# Patient Record
Sex: Female | Born: 1950 | Race: Black or African American | Hispanic: No | Marital: Single | Smoking: Former smoker
Health system: Southern US, Community
[De-identification: ages and names within clinical notes are randomized; demographics above are authoritative.]

## PROBLEM LIST (undated history)

## (undated) DIAGNOSIS — I10 Essential (primary) hypertension: Secondary | ICD-10-CM

## (undated) DIAGNOSIS — E669 Obesity, unspecified: Secondary | ICD-10-CM

## (undated) DIAGNOSIS — IMO0001 Reserved for inherently not codable concepts without codable children: Secondary | ICD-10-CM

## (undated) DIAGNOSIS — N63 Unspecified lump in unspecified breast: Secondary | ICD-10-CM

## (undated) DIAGNOSIS — L309 Dermatitis, unspecified: Secondary | ICD-10-CM

## (undated) DIAGNOSIS — M1990 Unspecified osteoarthritis, unspecified site: Secondary | ICD-10-CM

## (undated) DIAGNOSIS — R0602 Shortness of breath: Secondary | ICD-10-CM

## (undated) DIAGNOSIS — Z800 Family history of malignant neoplasm of digestive organs: Secondary | ICD-10-CM

## (undated) DIAGNOSIS — H269 Unspecified cataract: Secondary | ICD-10-CM

## (undated) DIAGNOSIS — D649 Anemia, unspecified: Secondary | ICD-10-CM

## (undated) DIAGNOSIS — G459 Transient cerebral ischemic attack, unspecified: Secondary | ICD-10-CM

## (undated) DIAGNOSIS — T7840XA Allergy, unspecified, initial encounter: Secondary | ICD-10-CM

## (undated) DIAGNOSIS — N281 Cyst of kidney, acquired: Secondary | ICD-10-CM

## (undated) DIAGNOSIS — K829 Disease of gallbladder, unspecified: Secondary | ICD-10-CM

## (undated) DIAGNOSIS — G4730 Sleep apnea, unspecified: Secondary | ICD-10-CM

## (undated) HISTORY — DX: Transient cerebral ischemic attack, unspecified: G45.9

## (undated) HISTORY — DX: Sleep apnea, unspecified: G47.30

## (undated) HISTORY — DX: Reserved for inherently not codable concepts without codable children: IMO0001

## (undated) HISTORY — DX: Shortness of breath: R06.02

## (undated) HISTORY — DX: Cyst of kidney, acquired: N28.1

## (undated) HISTORY — PX: ABDOMINAL HYSTERECTOMY: SHX81

## (undated) HISTORY — DX: Anemia, unspecified: D64.9

## (undated) HISTORY — DX: Dermatitis, unspecified: L30.9

## (undated) HISTORY — DX: Unspecified cataract: H26.9

## (undated) HISTORY — DX: Obesity, unspecified: E66.9

## (undated) HISTORY — DX: Unspecified osteoarthritis, unspecified site: M19.90

## (undated) HISTORY — DX: Disease of gallbladder, unspecified: K82.9

## (undated) HISTORY — DX: Unspecified lump in unspecified breast: N63.0

## (undated) HISTORY — DX: Allergy, unspecified, initial encounter: T78.40XA

## (undated) HISTORY — PX: CHOLECYSTECTOMY: SHX55

## (undated) HISTORY — DX: Family history of malignant neoplasm of digestive organs: Z80.0

---

## 1996-03-20 HISTORY — PX: PARTIAL HYSTERECTOMY: SHX80

## 1998-11-19 HISTORY — PX: BUNIONECTOMY: SHX129

## 1998-11-19 HISTORY — PX: FOOT SURGERY: SHX648

## 2000-11-12 ENCOUNTER — Other Ambulatory Visit: Admission: RE | Admit: 2000-11-12 | Discharge: 2000-11-12 | Payer: Self-pay | Admitting: Family Medicine

## 2002-02-24 ENCOUNTER — Other Ambulatory Visit: Admission: RE | Admit: 2002-02-24 | Discharge: 2002-02-24 | Payer: Self-pay | Admitting: Family Medicine

## 2002-02-24 ENCOUNTER — Encounter: Payer: Self-pay | Admitting: Family Medicine

## 2002-05-19 ENCOUNTER — Encounter: Admission: RE | Admit: 2002-05-19 | Discharge: 2002-05-19 | Payer: Self-pay | Admitting: Family Medicine

## 2002-05-19 ENCOUNTER — Encounter: Payer: Self-pay | Admitting: Family Medicine

## 2002-05-19 HISTORY — PX: CHOLECYSTECTOMY: SHX55

## 2002-05-25 ENCOUNTER — Emergency Department (HOSPITAL_COMMUNITY): Admission: EM | Admit: 2002-05-25 | Discharge: 2002-05-25 | Payer: Self-pay | Admitting: Emergency Medicine

## 2002-06-17 ENCOUNTER — Encounter: Payer: Self-pay | Admitting: General Surgery

## 2002-06-17 ENCOUNTER — Encounter (INDEPENDENT_AMBULATORY_CARE_PROVIDER_SITE_OTHER): Payer: Self-pay

## 2002-06-17 ENCOUNTER — Observation Stay (HOSPITAL_COMMUNITY): Admission: RE | Admit: 2002-06-17 | Discharge: 2002-06-18 | Payer: Self-pay | Admitting: General Surgery

## 2002-10-22 ENCOUNTER — Encounter: Admission: RE | Admit: 2002-10-22 | Discharge: 2002-10-22 | Payer: Self-pay | Admitting: Family Medicine

## 2002-10-22 ENCOUNTER — Encounter: Payer: Self-pay | Admitting: Family Medicine

## 2004-02-24 ENCOUNTER — Ambulatory Visit: Payer: Self-pay | Admitting: Family Medicine

## 2004-09-21 ENCOUNTER — Ambulatory Visit: Payer: Self-pay | Admitting: Family Medicine

## 2004-12-19 ENCOUNTER — Ambulatory Visit: Payer: Self-pay | Admitting: Family Medicine

## 2005-10-18 ENCOUNTER — Ambulatory Visit: Payer: Self-pay | Admitting: Family Medicine

## 2005-12-20 ENCOUNTER — Ambulatory Visit: Payer: Self-pay | Admitting: Internal Medicine

## 2006-06-25 ENCOUNTER — Ambulatory Visit: Payer: Self-pay | Admitting: Family Medicine

## 2006-08-02 ENCOUNTER — Encounter: Payer: Self-pay | Admitting: Family Medicine

## 2006-08-02 DIAGNOSIS — L309 Dermatitis, unspecified: Secondary | ICD-10-CM | POA: Insufficient documentation

## 2006-08-15 ENCOUNTER — Other Ambulatory Visit: Admission: RE | Admit: 2006-08-15 | Discharge: 2006-08-15 | Payer: Self-pay | Admitting: Family Medicine

## 2006-08-15 ENCOUNTER — Ambulatory Visit: Payer: Self-pay | Admitting: Family Medicine

## 2006-08-15 ENCOUNTER — Encounter: Payer: Self-pay | Admitting: Family Medicine

## 2006-08-16 LAB — CONVERTED CEMR LAB
ALT: 15 units/L (ref 0–35)
AST: 18 units/L (ref 0–37)
Basophils Absolute: 0 10*3/uL (ref 0.0–0.1)
Basophils Relative: 0 % (ref 0–1)
Bilirubin, Direct: 0.1 mg/dL (ref 0.0–0.3)
Calcium: 9.7 mg/dL (ref 8.4–10.5)
Cholesterol: 152 mg/dL (ref 0–200)
Eosinophils Absolute: 0.2 10*3/uL (ref 0.0–0.7)
Eosinophils Relative: 2 % (ref 0–5)
HCT: 39.5 % (ref 36.0–46.0)
HDL: 46 mg/dL (ref >39)
Lymphs Abs: 2.7 10*3/uL (ref 0.7–3.3)
MCV: 85.3 fL (ref 78.0–100.0)
Neutrophils Relative %: 56 % (ref 43–77)
Platelets: 273 10*3/uL (ref 150–400)
Potassium: 4.1 meq/L (ref 3.5–5.3)
RDW: 14.4 % — ABNORMAL HIGH (ref 11.5–14.0)
Sodium: 139 meq/L (ref 135–145)
TSH: 1.509 microintl units/mL (ref 0.350–5.50)
Total CHOL/HDL Ratio: 3.3
Total Protein: 7.1 g/dL (ref 6.0–8.3)
Triglycerides: 73 mg/dL (ref <150)
VLDL: 15 mg/dL (ref 0–40)
WBC: 7.8 10*3/uL (ref 4.0–10.5)

## 2006-08-22 ENCOUNTER — Encounter (INDEPENDENT_AMBULATORY_CARE_PROVIDER_SITE_OTHER): Payer: Self-pay | Admitting: *Deleted

## 2006-08-22 LAB — CONVERTED CEMR LAB: Pap Smear: NORMAL

## 2007-01-16 ENCOUNTER — Ambulatory Visit: Payer: Self-pay | Admitting: Internal Medicine

## 2007-01-26 LAB — HM COLONOSCOPY: HM Colonoscopy: NORMAL

## 2007-01-30 ENCOUNTER — Ambulatory Visit: Payer: Self-pay | Admitting: Internal Medicine

## 2007-01-30 ENCOUNTER — Encounter: Payer: Self-pay | Admitting: Family Medicine

## 2007-02-23 ENCOUNTER — Ambulatory Visit: Payer: Self-pay | Admitting: Family Medicine

## 2007-06-20 ENCOUNTER — Ambulatory Visit: Payer: Self-pay | Admitting: Family Medicine

## 2007-08-16 ENCOUNTER — Ambulatory Visit: Payer: Self-pay | Admitting: Family Medicine

## 2008-01-27 ENCOUNTER — Ambulatory Visit: Payer: Self-pay | Admitting: Family Medicine

## 2008-01-27 DIAGNOSIS — L83 Acanthosis nigricans: Secondary | ICD-10-CM | POA: Insufficient documentation

## 2008-01-27 LAB — CONVERTED CEMR LAB
BUN: 16 mg/dL (ref 6–23)
Calcium: 9.4 mg/dL (ref 8.4–10.5)
Chloride: 106 meq/L (ref 96–112)
Creatinine, Ser: 0.9 mg/dL (ref 0.4–1.2)
GFR calc non Af Amer: 69 mL/min

## 2008-01-30 ENCOUNTER — Ambulatory Visit: Payer: Self-pay | Admitting: Family Medicine

## 2008-08-10 ENCOUNTER — Encounter: Admission: RE | Admit: 2008-08-10 | Discharge: 2008-08-10 | Payer: Self-pay | Admitting: Family Medicine

## 2008-08-10 ENCOUNTER — Ambulatory Visit: Payer: Self-pay | Admitting: Family Medicine

## 2008-08-26 ENCOUNTER — Telehealth: Payer: Self-pay | Admitting: Family Medicine

## 2008-11-18 ENCOUNTER — Other Ambulatory Visit: Admission: RE | Admit: 2008-11-18 | Discharge: 2008-11-18 | Payer: Self-pay | Admitting: Family Medicine

## 2008-11-18 ENCOUNTER — Ambulatory Visit: Payer: Self-pay | Admitting: Family Medicine

## 2008-11-18 ENCOUNTER — Encounter: Payer: Self-pay | Admitting: Family Medicine

## 2008-11-18 DIAGNOSIS — IMO0001 Reserved for inherently not codable concepts without codable children: Secondary | ICD-10-CM

## 2008-11-18 DIAGNOSIS — R109 Unspecified abdominal pain: Secondary | ICD-10-CM | POA: Insufficient documentation

## 2008-11-18 HISTORY — DX: Reserved for inherently not codable concepts without codable children: IMO0001

## 2008-11-18 LAB — HM PAP SMEAR

## 2008-11-24 ENCOUNTER — Encounter (INDEPENDENT_AMBULATORY_CARE_PROVIDER_SITE_OTHER): Payer: Self-pay | Admitting: *Deleted

## 2008-12-09 ENCOUNTER — Ambulatory Visit: Payer: Self-pay | Admitting: Internal Medicine

## 2008-12-09 ENCOUNTER — Encounter: Payer: Self-pay | Admitting: Family Medicine

## 2008-12-09 ENCOUNTER — Ambulatory Visit: Payer: Self-pay

## 2008-12-14 ENCOUNTER — Ambulatory Visit: Payer: Self-pay | Admitting: Family Medicine

## 2008-12-14 DIAGNOSIS — N259 Disorder resulting from impaired renal tubular function, unspecified: Secondary | ICD-10-CM | POA: Insufficient documentation

## 2008-12-14 LAB — CONVERTED CEMR LAB
Bacteria, UA: 0
Blood in Urine, dipstick: NEGATIVE
Glucose, Urine, Semiquant: NEGATIVE
Protein, U semiquant: NEGATIVE
Specific Gravity, Urine: 1.015
Urobilinogen, UA: 0.2
WBC Urine, dipstick: NEGATIVE
WBC, UA: 0 cells/hpf
Yeast, UA: 0

## 2008-12-17 ENCOUNTER — Encounter (INDEPENDENT_AMBULATORY_CARE_PROVIDER_SITE_OTHER): Payer: Self-pay | Admitting: *Deleted

## 2008-12-21 ENCOUNTER — Encounter (INDEPENDENT_AMBULATORY_CARE_PROVIDER_SITE_OTHER): Payer: Self-pay | Admitting: *Deleted

## 2008-12-28 ENCOUNTER — Ambulatory Visit: Payer: Self-pay | Admitting: Family Medicine

## 2008-12-30 LAB — CONVERTED CEMR LAB
BUN: 11 mg/dL (ref 6–23)
CO2: 29 meq/L (ref 19–32)
Chloride: 104 meq/L (ref 96–112)
GFR calc non Af Amer: 73.76 mL/min (ref >60)
Phosphorus: 4 mg/dL (ref 2.3–4.6)
Potassium: 4.2 meq/L (ref 3.5–5.1)

## 2009-08-10 ENCOUNTER — Encounter: Admission: RE | Admit: 2009-08-10 | Discharge: 2009-08-10 | Payer: Self-pay | Admitting: Family Medicine

## 2009-08-10 ENCOUNTER — Ambulatory Visit: Payer: Self-pay | Admitting: Family Medicine

## 2009-08-10 DIAGNOSIS — R51 Headache: Secondary | ICD-10-CM | POA: Insufficient documentation

## 2009-08-10 DIAGNOSIS — R519 Headache, unspecified: Secondary | ICD-10-CM | POA: Insufficient documentation

## 2009-08-10 DIAGNOSIS — M25559 Pain in unspecified hip: Secondary | ICD-10-CM | POA: Insufficient documentation

## 2009-08-24 ENCOUNTER — Encounter: Payer: Self-pay | Admitting: Family Medicine

## 2009-08-25 LAB — FECAL OCCULT BLOOD, GUAIAC: Fecal Occult Blood: NEGATIVE

## 2009-10-04 ENCOUNTER — Telehealth: Payer: Self-pay | Admitting: Family Medicine

## 2010-04-17 LAB — CONVERTED CEMR LAB
Basophils Absolute: 0 10*3/uL (ref 0.0–0.1)
Basophils Relative: 0 % (ref 0.0–3.0)
Bilirubin, Direct: 0 mg/dL (ref 0.0–0.3)
Blood in Urine, dipstick: NEGATIVE
Calcium: 9.4 mg/dL (ref 8.4–10.5)
Eosinophils Absolute: 0.2 10*3/uL (ref 0.0–0.7)
Eosinophils Relative: 2.5 % (ref 0.0–5.0)
GFR calc non Af Amer: 54.51 mL/min (ref >60)
Glucose, Urine, Semiquant: NEGATIVE
HCT: 39 % (ref 36.0–46.0)
HDL: 42.8 mg/dL (ref >39.00)
Hemoglobin: 13.1 g/dL (ref 12.0–15.0)
Lymphocytes Relative: 30.9 % (ref 12.0–46.0)
Monocytes Relative: 3.4 % (ref 3.0–12.0)
Neutro Abs: 5.9 10*3/uL (ref 1.4–7.7)
Nitrite: NEGATIVE
Potassium: 4.4 meq/L (ref 3.5–5.1)
Protein, U semiquant: NEGATIVE
RBC: 4.48 M/uL (ref 3.87–5.11)
Sodium: 142 meq/L (ref 135–145)
TSH: 1.29 microintl units/mL (ref 0.35–5.50)
Total Bilirubin: 0.6 mg/dL (ref 0.3–1.2)
Total CHOL/HDL Ratio: 3
VLDL: 17.8 mg/dL (ref 0.0–40.0)
WBC Urine, dipstick: NEGATIVE
pH: 6

## 2010-04-21 NOTE — Progress Notes (Signed)
Summary: Triage Call...  Phone Note Call from Patient   Caller: Patient Call For: Judith Part MD Summary of Call: Triage Call... Pt request a handicap sticker, pts call back  # [phone].Marland KitchenDaine Gip  October 04, 2009 3:44 PM  Initial call taken by: Daine Gip,  October 04, 2009 3:44 PM  Follow-up for Phone Call        ask her what dx we are using for the inability to walk (ie: why she needs it ) and I will get it done for her  Follow-up by: Judith Part MD,  October 04, 2009 5:01 PM  Additional Follow-up for Phone Call Additional follow up Details #1::        Patient notified as instructed by telephone. Pt said needs handicap sticker due to arthritis in back. She can only walk short distances without stopping to rest.[facility] LPN  October 04, 2009 5:20 PM   form done and in nurse in box -- [place] Additional Follow-up by: Judith Part MD,  October 04, 2009 5:23 PM    Additional Follow-up for Phone Call Additional follow up Details #2::    Left message for patient to call back. Handicap placard form left at front desk. Lewanda Rife LPN  October 05, 2009 11:27 AM   Patient notified as instructed by telephone. Lewanda Rife LPN  October 05, 2009 12:46 PM

## 2010-04-21 NOTE — Assessment & Plan Note (Signed)
Summary: abdominalpain/  head mri??dlo   Vital Signs:  Patient profile:   60 year old female Height:      63 inches Weight:      246.25 pounds BMI:     43.78 Temp:     98 degrees F oral Pulse rate:   72 / minute Pulse rhythm:   regular BP sitting:   110 / 68  (left arm) Cuff size:   large  Vitals Entered By: Lewanda Rife LPN (Aug 10, 2009 8:08 AM) CC: Lower abdominal pain is sharp on and off. Pt ? muscular. ) pain now. Also sometimes has strange feeling lt side of head and h/a on and off   History of Present Illness: sometimes she has a pain in L groin  is worse when she walks -- tries to lean to the other side  is on and off- not bothering her today  hyst in past - was partial for fibroids   no female cancer in family mother had colon cancer her colonosc 08 -- nothing at all no stool changes or blood in her stool  last pap nl 9/10    sometimes funny feeling on L side of her head  pressure feeling - was brief and fleeting  did not hurt  few mild headaches occas - not often  could be stress rel-- a lot of grants and deadlines and needs a vacation  no speech problems or facial droop or numbness or weakness   will get a break next month from work and stress- has been difficult lately     Allergies: 1)  ! * Tums 2)  Penicillin  Past History:  Past Medical History: Last updated: 01/30/2008 eczema  obesity fam hx of colon ca   Past Surgical History: Last updated: 12/10/2008 Cholecystectomy (05/2002) Hysterectomy- fibroids (1998)- ovaries remain  Foot surgery- bunion (11/1998) Breast lump- left Dexa- normal (03/2002),  normal (05/2006) [place]- gallstones, small renal cyst (05/2002) Small cysts right kidney (10/2002) colonosc neg 11/08- next due in 5 years 9/10 exercise stress test normal but limited by exercise intolerance  Family History: Last updated: 08/15/2006 Father: Lung cancer- smoker Mother: Colon and stomach cancer Siblings: 2 sisters with HTN 2 GM  with CVAs  Social History: Last updated: 08/02/2006 Marital Status:  Children: 2 Occupation: Counsellor Former Smoker  Risk Factors: Smoking Status: quit (08/02/2006)  Review of Systems General:  Denies fatigue, fever, loss of appetite, malaise, and weakness. Eyes:  Denies blurring and eye pain. CV:  Denies chest pain or discomfort, palpitations, and shortness of breath with exertion. Resp:  Denies cough, shortness of breath, sputum productive, and wheezing. GI:  Denies abdominal pain, bloody stools, change in bowel habits, indigestion, and nausea. GU:  Denies discharge, dysuria, hematuria, and urinary frequency. MS:  Complains of joint pain; denies joint redness, joint swelling, muscle aches, and cramps. Derm:  Denies itching, lesion(s), poor wound healing, and rash. Neuro:  Complains of headaches; denies difficulty with concentration, disturbances in coordination, falling down, memory loss, numbness, tingling, tremors, visual disturbances, and weakness. Psych:  Denies anxiety and depression. Endo:  Denies cold intolerance, excessive thirst, excessive urination, and heat intolerance. Heme:  Denies abnormal bruising and bleeding.  Physical Exam  General:  obese and generally well appearing  Head:  normocephalic, atraumatic, and no abnormalities observed.  no sinus tenderness no temporal tenderness  Eyes:  vision grossly intact, pupils equal, pupils round, and pupils reactive to light.  fundi grossly nl bilat no conjunctival pallor, injection or  icterus  no nystagmus  Ears:  some cerumen and dry skin in ears today Nose:  no nasal discharge.   Mouth:  pharynx pink and moist.   Neck:  supple with full rom and no masses or thyromegally, no JVD or carotid bruit  Chest Wall:  No deformities, masses, or tenderness noted. Lungs:  Normal respiratory effort, chest expands symmetrically. Lungs are clear to auscultation, no crackles or wheezes. Heart:  Normal rate and regular rhythm. S1  and S2 normal without gallop, murmur, click, rub or other extra sounds. Abdomen:  Bowel sounds positive,abdomen soft and non-tender without masses, organomegaly or hernias noted. Msk:  some reprod of hip/ groin pain with SLR on L and also with ext rot of hip mild LS tenderness no trochanteric tenderness   Pulses:  R and L carotid,radial,femoral,dorsalis pedis and posterior tibial pulses are full and equal bilaterally Extremities:  No clubbing, cyanosis, edema, or deformity noted with normal full range of motion of all joints.   Neurologic:  alert & oriented X3, cranial nerves II-XII intact, strength normal in all extremities, sensation intact to pinprick, gait normal, DTRs symmetrical and normal, toes down bilaterally on Babinski, and Romberg negative.   Skin:  Intact without suspicious lesions or rashes Cervical Nodes:  No lymphadenopathy noted Inguinal Nodes:  No significant adenopathy Psych:  normal affect, talkative and pleasant    Impression & Recommendations:  Problem # 1:  INGUINAL PAIN, LEFT (ICD-789.09) Assessment New with reprod of pain on SLR and also ext rot of hip / no tenderness or hernia noted  suspect MS etiol like OA of hip or radiculopathy -- esp in light of morbid obesity  will send for x ray LS and hip and update no meds at this time disc stretching if all nl -- pt is interested in [place] of ovaries  Orders: Radiology Referral (Radiology)  Problem # 2:  HEADACHE (ICD-784.0) Assessment: New intermittent and very mild with nl neuro exam and no red flags  suspect is stress related and also disc poss of migraine like syndrome  will try limiting caff/ inc water and more sleep if not imp- f/u or update   Patient Instructions: 1)  we will send you for some xrays of your hip and back at check out  2)  if pain worsens let me know  3)  if headache becomes more persistant or any other changes like numbness or speech problems -- alert me or seek care  4)  try to avoid  caffine and drink lots of water   Prior Medications (reviewed today): None Current Allergies (reviewed today): ! * TUMS PENICILLIN

## 2010-04-21 NOTE — Consult Note (Signed)
Summary: [facility]  [facility]   Imported By: Lanelle Bal 09/10/2009 10:14:32  _____________________________________________________________________  External Attachment:    Type:   Image     Comment:   External Document

## 2010-04-30 ENCOUNTER — Encounter: Payer: Self-pay | Admitting: Family Medicine

## 2010-04-30 LAB — HM MAMMOGRAPHY: HM Mammogram: NORMAL

## 2010-05-04 ENCOUNTER — Encounter (INDEPENDENT_AMBULATORY_CARE_PROVIDER_SITE_OTHER): Payer: Self-pay | Admitting: *Deleted

## 2010-05-11 NOTE — Miscellaneous (Signed)
Summary: Mammogram to flowsheet   Clinical Lists Changes  Observations: Added new observation of MAMMO DUE: 04/2011 (05/04/2010 12:19) Added new observation of MAMMOGRAM: normal (04/30/2010 12:19)      Preventive Care Screening  Mammogram:    Date:  04/30/2010    Next Due:  04/2011    Results:  normal

## 2010-05-11 NOTE — Letter (Signed)
Summary: Results Follow up Letter  [place] [facility]  [address] [place], [place] [address]   Phone: [phone]  Fax: [phone]    05/04/2010 MRN: [id number]  [address] [place], [place]  [address]  Dear Barbara Kramer,  The following are the results of your recent test(s):  Test         Result    Pap Smear:        Normal _____  Not Normal _____ Comments: ______________________________________________________ Cholesterol: LDL(Bad cholesterol):         Your goal is less than:         HDL (Good cholesterol):       Your goal is more than: Comments:  ______________________________________________________ Mammogram:        Normal __X___  Not Normal _____ Comments: Repeat in 1 year  ___________________________________________________________________ Hemoccult:        Normal _____  Not normal _______ Comments:    _____________________________________________________________________ Other Tests:    We routinely do not discuss normal results over the telephone.  If you desire a copy of the results, or you have any questions about this information we can discuss them at your next office visit.   Sincerely,      Sharilyn Sites for Dr. Roxy Manns

## 2010-08-05 NOTE — Op Note (Signed)
NAME:  Barbara Kramer, Barbara Kramer                        ACCOUNT NO.:  [id number]   MEDICAL RECORD NO.:  [id number]                   PATIENT TYPE:  AMB   LOCATION:  DAY                                  FACILITY:  [facility]   PHYSICIAN:  Adolph Pollack, M.D.            DATE OF BIRTH:  [birth year]   DATE OF PROCEDURE:  06/17/2002  DATE OF DISCHARGE:                                 OPERATIVE REPORT   PREOPERATIVE DIAGNOSIS:  Symptomatic cholelithiasis.   POSTOPERATIVE DIAGNOSIS:  Chronic cholecystitis with cholelithiasis.   PROCEDURE:  Laparoscopic cholecystectomy with intraoperative cholangiogram.   SURGEON:  Adolph Pollack, M.D.   ASSISTANT:  Donnie Coffin. Samuella Cota, M.D.   ANESTHESIA:  General.   INDICATIONS:  The patient is a 60 year old female who has had two fairly  significant attacks of biliary colic after eating some fried foods.  An  ultrasound demonstrated cholelithiasis.  No biliary dilatation was seen.  Preoperative liver function tests were normal, and she presents now for  cholecystectomy.  The procedure and the risks were explained to her.   DESCRIPTION OF PROCEDURE:  She was seen in the holding area, brought to the  operating room and placed supine on the operating table, and a general  anesthetic was administered.  Her abdomen was sterilely prepped and draped.  Plain Marcaine 0.5% was infiltrated in the subumbilical region and a  subumbilical incision was made through the skin and subcutaneous tissue  until the midline fascia was identified.  The midline fascia and peritoneum  were then incised sharply and the peritoneal cavity was entered under direct  vision.  A pursestring suture of 0 Vicryl was placed around the fascial  edges.  A Hasson trocar was introduced to the peritoneal cavity and a  pneumoperitoneum was created by insufflation of CO2 gas.   The laparoscope was introduced and there were noted to be adhesions between  the liver and the gallbladder and the  anterior abdominal wall, also some  between the omentum and anterior abdominal wall.  She was then placed in the  reverse Trendelenburg position and with the right side tilted slightly up.  An 11 mm trocar was placed through an incision in the epigastrium and two 5  mm trocars placed in the right midabdomen.  Sharp dissection was used for  the adhesiolysis.  We then grasped the fundus of the gallbladder and  dissected the omental adhesions free bluntly and sharply.  The fundus was  then retracted toward the right shoulder and the infundibulum grasped and  retracted laterally.  The infundibulum was mobilized with blunt dissection  and select cautery, exposing a cystic artery and cystic duct lying adjacent  to each other.  I dissected out the cystic artery, made a window around it,  and then clipped and divided it.  Then using blunt dissection I created a  window around the cystic duct.  I placed a clip just above the cystic  duct-  gallbladder junction.  A small incision was made at the cystic duct-  gallbladder junction and a cholangiocatheter was placed into the cystic duct  and a cholangiogram was performed.   Under real-time fluoroscopy, dilute contrast material was injected.  The  cystic duct was quite long and tortuous.  The common hepatic, common bile  duct, right and left hepatics all filled.  The common bile duct drained  rapidly into the duodenum.  No obvious obstruction was noted.  The final  report is pending the radiologist's interpretation.   Next I clipped the cystic duct three times proximally and divided it and  also placed an Endoloop on it.  This appeared to be at the upper limits of  normal with respect to diameter.  I identified the posterior branch of the  cystic artery clipped it, and divided it.  The gallbladder was then  dissected free of the liver bed using electrocautery.   The gallbladder fossa was irrigated and any bleeding point was controlled  with cautery.   Hemostasis was then noted.  No bile leakage was noted.   I then irrigated the perihepatic area and evacuated the fluid, and it was  clear.  I removed the gallbladder through the subumbilical port and closed  the subumbilical fascial defect by tightening up and tying down the  pursestring suture.  The remainder of the trocars were removed and the  pneumoperitoneum was released.   The skin incisions were closed with 4-0 Monocryl subcuticular sutures.  Steri-Strips and sterile dressings were applied.  She tolerated the  procedure well without any apparent complications and was taken to the  recovery room in satisfactory condition.                                               Adolph Pollack, M.D.    Kari Baars  D:  06/17/2002  T:  06/17/2002  Job:  [id number]   cc:   Marne A. Milinda Antis, M.D. [facility]

## 2010-09-10 ENCOUNTER — Encounter: Payer: Self-pay | Admitting: Family Medicine

## 2010-09-14 ENCOUNTER — Ambulatory Visit: Payer: Self-pay | Admitting: Family Medicine

## 2010-09-14 DIAGNOSIS — Z0289 Encounter for other administrative examinations: Secondary | ICD-10-CM

## 2010-10-06 ENCOUNTER — Telehealth: Payer: Self-pay | Admitting: Family Medicine

## 2010-10-06 DIAGNOSIS — N259 Disorder resulting from impaired renal tubular function, unspecified: Secondary | ICD-10-CM

## 2010-10-06 DIAGNOSIS — Z0000 Encounter for general adult medical examination without abnormal findings: Secondary | ICD-10-CM | POA: Insufficient documentation

## 2010-10-06 NOTE — Telephone Encounter (Signed)
Message copied by Judy Pimple on Thu Oct 06, 2010  9:38 PM ------      Message from: Baldomero Lamy      Created: Thu Oct 06, 2010 10:40 AM      Regarding: cpx labs wed       Please order  future cpx labs for pt's upcomming lab appt.      Thanks      Rodney Booze

## 2010-10-12 ENCOUNTER — Other Ambulatory Visit (INDEPENDENT_AMBULATORY_CARE_PROVIDER_SITE_OTHER): Payer: BC Managed Care – PPO | Admitting: Family Medicine

## 2010-10-12 DIAGNOSIS — N259 Disorder resulting from impaired renal tubular function, unspecified: Secondary | ICD-10-CM

## 2010-10-12 DIAGNOSIS — Z0000 Encounter for general adult medical examination without abnormal findings: Secondary | ICD-10-CM

## 2010-10-12 LAB — CBC WITH DIFFERENTIAL/PLATELET
Basophils Absolute: 0 10*3/uL (ref 0.0–0.1)
Basophils Relative: 0.5 % (ref 0.0–3.0)
Eosinophils Absolute: 0.2 10*3/uL (ref 0.0–0.7)
HCT: 39.8 % (ref 36.0–46.0)
Hemoglobin: 12.9 g/dL (ref 12.0–15.0)
Lymphocytes Relative: 33.9 % (ref 12.0–46.0)
Lymphs Abs: 2.6 10*3/uL (ref 0.7–4.0)
MCHC: 32.5 g/dL (ref 30.0–36.0)
MCV: 87.7 fl (ref 78.0–100.0)
Monocytes Absolute: 0.4 10*3/uL (ref 0.1–1.0)
Neutro Abs: 4.5 10*3/uL (ref 1.4–7.7)
RBC: 4.54 Mil/uL (ref 3.87–5.11)
RDW: 15.3 % — ABNORMAL HIGH (ref 11.5–14.6)

## 2010-10-12 LAB — COMPREHENSIVE METABOLIC PANEL
ALT: 17 U/L (ref 0–35)
AST: 17 U/L (ref 0–37)
Alkaline Phosphatase: 66 U/L (ref 39–117)
BUN: 14 mg/dL (ref 6–23)
Calcium: 9.3 mg/dL (ref 8.4–10.5)
Chloride: 108 mEq/L (ref 96–112)
Creatinine, Ser: 0.9 mg/dL (ref 0.4–1.2)

## 2010-10-12 LAB — LIPID PANEL
LDL Cholesterol: 80 mg/dL (ref 0–99)
Total CHOL/HDL Ratio: 3
Triglycerides: 79 mg/dL (ref 0.0–149.0)

## 2010-10-18 ENCOUNTER — Encounter: Payer: Self-pay | Admitting: Family Medicine

## 2010-10-18 ENCOUNTER — Telehealth: Payer: Self-pay | Admitting: *Deleted

## 2010-10-18 ENCOUNTER — Ambulatory Visit (INDEPENDENT_AMBULATORY_CARE_PROVIDER_SITE_OTHER): Payer: BC Managed Care – PPO | Admitting: Family Medicine

## 2010-10-18 DIAGNOSIS — M545 Low back pain, unspecified: Secondary | ICD-10-CM

## 2010-10-18 DIAGNOSIS — E669 Obesity, unspecified: Secondary | ICD-10-CM

## 2010-10-18 DIAGNOSIS — Z0000 Encounter for general adult medical examination without abnormal findings: Secondary | ICD-10-CM

## 2010-10-18 NOTE — Telephone Encounter (Signed)
Will do ref and route to [facility]

## 2010-10-18 NOTE — Assessment & Plan Note (Signed)
Reviewed health habits including diet and exercise and skin cancer prevention Also reviewed health mt list, fam hx and immunizations   Reviewed wellness labs in detail Disc need for wt loss for better health

## 2010-10-18 NOTE — Assessment & Plan Note (Signed)
This is intermittent  Pt has known disc dz and poss OA of spine - worsened by obesity and lack of exercise Some pain rad to lower abd (exam incl bimanual vag exam today did not yield pain) Pt is not interested in specialist eval at this time or any medication I brought up the idea of PT and she will think about that I think wt loss and muscle strengthening are important  No neurologic findings but if some develop MRI should be considered She will call back with her decision re: what to do

## 2010-10-18 NOTE — Progress Notes (Signed)
Subjective:    Patient ID: Barbara Kramer, female    DOB: Oct 01, 1950, 60 y.o.   MRN: [id number]  HPI Here for annual health mt  Exam and  also to review chronic med problems   Has been ok overall  This am is sore in lower back - last night or today Thinks she strained something  Has a chronic problem with arthritis in her back  Does not want a referral at this time - does not take med for it   No regular exercise  Would consider PT   Wt is up 15 lb Is getting motivated to loose  Eats too much at night   Pap 9/10 Had hyst in 98- that was for fibroids  No discharge or bleeding   Mam2/12 neg Self exam no lumps or changes   Td5/08   colonosc nl 11/08 Has fam hx  Due in 2013 No stool changes   Hx of renal insuf- but cr is .9 Other wellness labs ok   Lipids are good  Lab Results  Component Value Date   CHOL 148 10/12/2010   CHOL 148 11/18/2008   CHOL 152 08/15/2006   Lab Results  Component Value Date   HDL 52.40 10/12/2010   HDL 78.29 11/18/2008   HDL 46 08/15/2006   Lab Results  Component Value Date   LDLCALC 80 10/12/2010   LDLCALC 87 11/18/2008   LDLCALC 91 08/15/2006   Lab Results  Component Value Date   TRIG 79.0 10/12/2010   TRIG 89.0 11/18/2008   TRIG 73 08/15/2006   Lab Results  Component Value Date   CHOLHDL 3 10/12/2010   CHOLHDL 3 11/18/2008   CHOLHDL 3.3 Ratio 08/15/2006   No results found for this basename: LDLDIRECT    Patient Active Problem List  Diagnoses  . OBESITY  . ALLERGIC RHINITIS  . ECZEMA  . ACANTHOSIS NIGRICANS  . HEADACHE  . Routine general medical examination at a health care facility  . Low back pain   Past Medical History  Diagnosis Date  . Eczema   . Obesity   . Family hx of colon cancer   . Breast lump     left  . Kidney cysts     small, right 10/2002  . Normal cardiac stress test 9/10    exercise stress test normal but limited by exercise intolerance   Past Surgical History  Procedure Date  . Cholecystectomy 05/2002     [place]- gallstones, small renal cyst 05/2002  . Partial hysterectomy 1998    fibroids, ovaries remain  . Foot surgery 11/1998    bunion   History  Substance Use Topics  . Smoking status: Former Games developer  . Smokeless tobacco: Not on file  . Alcohol Use: Not on file   Family History  Problem Relation Age of Onset  . Cancer Mother     colon and stomach  . Cancer Father     lung, smoker  . Hypertension Sister   . Stroke Maternal Grandmother   . Stroke Paternal Grandmother   . Hypertension Sister    Allergies  Allergen Reactions  . Calcium Carbonate     REACTION: ? rash  . Penicillins     REACTION: rash  . Tums Rash   No current outpatient prescriptions on file prior to visit.     Review of Systems Review of Systems  Constitutional: Negative for fever, appetite change, fatigue and unexpected weight change.  Eyes: Negative for pain and visual disturbance.  Respiratory: Negative for cough and shortness of breath.   Cardiovascular: Negative.  for cp or palipitations Gastrointestinal: Negative for nausea, diarrhea and constipation.  Genitourinary: Negative for urgency and frequency.  Skin: Negative for pallor. or rash  Neurological: Negative for weakness, light-headedness, numbness and headaches.  Hematological: Negative for adenopathy. Does not bruise/bleed easily.  Psychiatric/Behavioral: Negative for dysphoric mood. The patient is not nervous/anxious.          Objective:   Physical Exam  Constitutional: She appears well-developed and well-nourished.       overwt and well appearing   HENT:  Head: Normocephalic and atraumatic.  Right Ear: External ear normal.  Left Ear: External ear normal.  Nose: Nose normal.  Mouth/Throat: Oropharynx is clear and moist.  Eyes: Conjunctivae and EOM are normal. Pupils are equal, round, and reactive to light.  Neck: Normal range of motion. No JVD present. Carotid bruit is not present. No thyromegaly present.  Cardiovascular: Normal  rate, regular rhythm, normal heart sounds and intact distal pulses.   Pulmonary/Chest: Effort normal and breath sounds normal. No respiratory distress. She has no wheezes.  Abdominal: Soft. Bowel sounds are normal. She exhibits no distension and no mass. There is no tenderness.  Genitourinary: Vagina normal. No breast swelling, tenderness, discharge or bleeding. No vaginal discharge found.       No pain or masses felt on bimanual exam  Due to obesity - ovaries are difficult to palpate  Musculoskeletal: Normal range of motion. She exhibits no edema and no tenderness.       Poor rom LS without bony tenderness  Lymphadenopathy:    She has no cervical adenopathy.  Neurological: She is alert. She has normal reflexes. No cranial nerve deficit. Coordination normal.  Skin: Skin is warm and dry. No rash noted. No erythema. No pallor.  Psychiatric: She has a normal mood and affect.          Assessment & Plan:   No problem-specific assessment & plan notes found for this encounter.

## 2010-10-18 NOTE — Assessment & Plan Note (Signed)
Long disc about risks of obesity and limitations imposed by it  I urged her to change diet and find exercise that is reasonable (like recumbent bike)  Also wt watchers program if affordable  See inst  She is motivated for change at this time

## 2010-10-18 NOTE — Patient Instructions (Signed)
For weight loss - cut your portions by 1/3  Stay away from junk food and eat fruit/veg and lean protein  Stop eating at night Consider joining weight watchers  Exercise 5 days per week - a recumbent exercise bike would be good for you Call if you are interested in physical therapy for your back

## 2010-10-18 NOTE — Telephone Encounter (Signed)
Patient says that she checked with her insurance company about having PT and it is covered with by her insurance. She is asking for referral. Prefers going to [place].

## 2010-10-19 ENCOUNTER — Encounter: Payer: Self-pay | Admitting: Family Medicine

## 2010-10-19 ENCOUNTER — Telehealth: Payer: Self-pay | Admitting: *Deleted

## 2010-10-19 NOTE — Telephone Encounter (Signed)
PT set up at [facility] patient has been notified.

## 2010-10-19 NOTE — Telephone Encounter (Signed)
Patient notified as instructed by telephone. 

## 2010-10-19 NOTE — Telephone Encounter (Signed)
I wrote letter - in IN box I doubt she could get reimbursed but it is worth a try

## 2010-10-19 NOTE — Telephone Encounter (Signed)
Patient is asking if she can get a note stating that if would be beneficial for her to get a recumbent exercise bike because she can get reimbursed for it. Please call when note is ready.

## 2010-10-19 NOTE — Telephone Encounter (Signed)
Left v/m for pt to call back. Letter left at front desk for pick up.

## 2010-10-20 ENCOUNTER — Encounter: Payer: Self-pay | Admitting: Family Medicine

## 2010-11-19 ENCOUNTER — Encounter: Payer: Self-pay | Admitting: Family Medicine

## 2011-01-23 ENCOUNTER — Encounter (HOSPITAL_COMMUNITY): Payer: Self-pay | Admitting: *Deleted

## 2011-01-23 ENCOUNTER — Emergency Department (HOSPITAL_COMMUNITY): Payer: BC Managed Care – PPO

## 2011-01-23 ENCOUNTER — Other Ambulatory Visit: Payer: Self-pay

## 2011-01-23 ENCOUNTER — Emergency Department (HOSPITAL_COMMUNITY)
Admission: EM | Admit: 2011-01-23 | Discharge: 2011-01-23 | Disposition: A | Discharge status: Home / Self Care | Payer: BC Managed Care – PPO | Attending: Emergency Medicine | Admitting: Emergency Medicine

## 2011-01-23 DIAGNOSIS — R079 Chest pain, unspecified: Secondary | ICD-10-CM | POA: Insufficient documentation

## 2011-01-23 DIAGNOSIS — K219 Gastro-esophageal reflux disease without esophagitis: Secondary | ICD-10-CM | POA: Insufficient documentation

## 2011-01-23 DIAGNOSIS — E669 Obesity, unspecified: Secondary | ICD-10-CM | POA: Insufficient documentation

## 2011-01-23 MED ORDER — FAMOTIDINE 20 MG PO TABS
20.0000 mg | ORAL_TABLET | Freq: Once | ORAL | Status: AC
  Administered 2011-01-23: 20 mg via ORAL
  Filled 2011-01-23: qty 1

## 2011-01-23 MED ORDER — FAMOTIDINE 20 MG PO TABS: 20.0000 mg | ORAL_TABLET | Freq: Two times a day (BID) | ORAL | Status: DC

## 2011-01-23 NOTE — ED Provider Notes (Signed)
Medical screening examination/treatment/procedure(s) were performed by non-physician practitioner and as supervising physician I was immediately available for consultation/collaboration.  Olivia Mackie, MD 01/23/11 [phone]

## 2011-01-23 NOTE — ED Notes (Signed)
Assumed care on pt. No pain at this time respirations unlabored.

## 2011-01-23 NOTE — ED Notes (Signed)
Ekg done and old ekg found in muse and given to Dr. Effie Shy.

## 2011-01-23 NOTE — ED Notes (Signed)
C/o indigestion x3d, "when I swallow water it feels like it gets hung in esophagus & hurts a little bit", (denies: sob, nvd or fever), alert, NAD, calm, interactive, skin W&D, resps e/u, speech clear & unalterred.

## 2011-01-23 NOTE — ED Notes (Signed)
Received patient and assumed care, pt alert, oriented x3 and verbally responsive, pt medicated as order, denies pain or discomfort

## 2011-01-23 NOTE — ED Notes (Signed)
Patient is resting comfortably. No pain at this time , respirations unlabored.

## 2011-01-23 NOTE — ED Provider Notes (Signed)
History     CSN: [id number] Arrival date & time: 01/23/2011  3:04 AM   First MD Initiated Contact with Patient 01/23/11 [phone]      Chief Complaint  Patient presents with  . Gastrophageal Reflux    (Consider location/radiation/quality/duration/timing/severity/associated sxs/prior treatment) Patient is a 60 y.o. female presenting with GERD. The history is provided by the patient.  Gastrophageal Reflux This is a new problem. The current episode started in the past 7 days. The problem occurs intermittently. The problem has been unchanged. Associated symptoms include chest pain. Pertinent negatives include no abdominal pain, anorexia, change in bowel habit, chills, congestion, coughing, fever, headaches, myalgias, nausea, numbness, rash, sore throat, urinary symptoms, vertigo, vomiting or weakness. The symptoms are aggravated by nothing. She has tried nothing for the symptoms.   Patient states that she has had some indigestion with eating for the past 3 days. She states that when she eats, she has a sensation as if food gets stuck on the way down. She denies any history of GERD. She denies shortness of breath, diaphoresis, nausea, vomiting. The pain is located in the center of her chest and does not radiate. It is not worsened with exertion or breathing. She denies any history of cardiac problems.  Past Medical History  Diagnosis Date  . Eczema   . Obesity   . Family hx of colon cancer   . Breast lump     left  . Kidney cysts     small, right 10/2002  . Normal cardiac stress test 9/10    exercise stress test normal but limited by exercise intolerance    Past Surgical History  Procedure Date  . Cholecystectomy 05/2002    [place]- gallstones, small renal cyst 05/2002  . Partial hysterectomy 1998    fibroids, ovaries remain  . Foot surgery 11/1998    bunion    Family History  Problem Relation Age of Onset  . Cancer Mother     colon and stomach  . Cancer Father     lung, smoker  .  Hypertension Sister   . Stroke Maternal Grandmother   . Stroke Paternal Grandmother   . Hypertension Sister     History  Substance Use Topics  . Smoking status: Former Games developer  . Smokeless tobacco: Not on file  . Alcohol Use: No    OB History    Grav Para Term Preterm Abortions TAB SAB Ect Mult Living                  Review of Systems  Constitutional: Negative for fever and chills.  HENT: Negative for congestion and sore throat.   Respiratory: Negative for cough.   Cardiovascular: Positive for chest pain. Negative for palpitations and leg swelling.  Gastrointestinal: Negative for nausea, vomiting, abdominal pain, diarrhea, blood in stool, abdominal distention, anorexia and change in bowel habit.  Genitourinary: Negative for urgency, flank pain and difficulty urinating.  Musculoskeletal: Negative for myalgias and back pain.  Skin: Negative for rash.  Neurological: Negative for dizziness, vertigo, syncope, weakness, light-headedness, numbness and headaches.    Allergies  Calcium carbonate; Penicillins; and Tums  Home Medications  No current outpatient prescriptions on file.  BP 134/74  Pulse 74  Temp(Src) 98.7 F (37.1 C) (Oral)  Resp 18  Ht 5\' 4"  (1.626 m)  SpO2 99%  Physical Exam  Nursing note and vitals reviewed. Constitutional: She is oriented to person, place, and time. She appears well-developed and well-nourished. No distress.  HENT:  Head:  Normocephalic and atraumatic.  Right Ear: External ear normal.  Left Ear: External ear normal.  Nose: Nose normal.  Mouth/Throat: Oropharynx is clear and moist. No oropharyngeal exudate.  Eyes: Conjunctivae and EOM are normal. Pupils are equal, round, and reactive to light.  Neck: Normal range of motion. Neck supple. No thyromegaly present.  Cardiovascular: Normal rate, regular rhythm and normal heart sounds.  Exam reveals no gallop and no friction rub.   No murmur heard. Pulmonary/Chest: Effort normal and breath  sounds normal. No respiratory distress. She has no wheezes. She has no rales. She exhibits tenderness.       Mild midline chest TTP  Abdominal: Soft. Bowel sounds are normal. She exhibits no distension and no mass. There is no tenderness. There is no rebound and no guarding.  Musculoskeletal: Normal range of motion.  Neurological: She is alert and oriented to person, place, and time.  Skin: Skin is warm and dry. No rash noted. She is not diaphoretic.  Psychiatric: She has a normal mood and affect.    ED Course  Procedures (including critical care time)  Labs Reviewed - No data to display Dg Chest 2 View  01/23/2011  *RADIOLOGY REPORT*  Clinical Data: Chest pain.  CHEST - 2 VIEW  Comparison: None  Findings: The cardiac silhouette, mediastinal and hilar contours are within normal limits.  The lungs are clear.  No pleural effusion.  The bony thorax is intact.  IMPRESSION: No acute cardiopulmonary findings.  Original Report Authenticated By: P. Loralie Champagne, M.D.     1. Chest pain     EKG: normal EKG, normal sinus rhythm, unchanged from previous tracings, rate 66bpm.   MDM  Patient responded well to Pepcid in the dept. She drank water and ate and stated that her pain is much better than it has previously been with eating. CXR and ECG unremarkable. Given her presentation, I did not have suspicion for ACS or other acute chest pathology. She does have a PCP and was strongly encouraged to make a f/u with him in 1-2 days for a recheck. She was educated re: sx that would prompt a return visit to the ED for re-eval. Patient verbalized understanding and agreed to plan.        Grant Fontana, [place] 01/23/11 1810

## 2011-06-05 ENCOUNTER — Ambulatory Visit: Payer: BC Managed Care – PPO | Admitting: Family Medicine

## 2011-06-05 ENCOUNTER — Encounter: Payer: Self-pay | Admitting: Family Medicine

## 2011-06-05 ENCOUNTER — Ambulatory Visit (INDEPENDENT_AMBULATORY_CARE_PROVIDER_SITE_OTHER): Payer: BC Managed Care – PPO | Admitting: Family Medicine

## 2011-06-05 VITALS — BP 124/76 | HR 80 | Temp 98.9°F | Wt 262.5 lb

## 2011-06-05 DIAGNOSIS — R059 Cough, unspecified: Secondary | ICD-10-CM | POA: Insufficient documentation

## 2011-06-05 DIAGNOSIS — M25519 Pain in unspecified shoulder: Secondary | ICD-10-CM

## 2011-06-05 DIAGNOSIS — R05 Cough: Secondary | ICD-10-CM

## 2011-06-05 DIAGNOSIS — M25511 Pain in right shoulder: Secondary | ICD-10-CM | POA: Insufficient documentation

## 2011-06-05 MED ORDER — ALBUTEROL SULFATE HFA 108 (90 BASE) MCG/ACT IN AERS: 2.0000 | INHALATION_SPRAY | Freq: Four times a day (QID) | RESPIRATORY_TRACT | Status: AC | PRN

## 2011-06-05 MED ORDER — NAPROXEN 500 MG PO TABS: ORAL_TABLET | ORAL | Status: AC

## 2011-06-05 NOTE — Patient Instructions (Signed)
For wheezing-  Trial of albuterol inhaler. Continue mucinex.  May be season change or may be viral.  If fever >101.5, or worsening instead of improving, let us know. For R shoulder - I think you have irritation of right rotator cuff tendons - treat with naprosyn 500mg  twice daily with food for 5 days then as needed.  Stretching exercises provided.  May use ice/heat to shoulder. Update [place] if not improving with this treatment.

## 2011-06-05 NOTE — Assessment & Plan Note (Addendum)
There was mild wheezing left lung. H/o allergic rhinitis. ?seasonal exacerbation with component of RAD, but no h/o asthma. Doubt infectious bronchitis currently, will treat with scheduled albuterol inhaler for next few days - if progressively worsening, consider abx course. Discussed if worsening to notify us. May continue mucinex.

## 2011-06-05 NOTE — Progress Notes (Signed)
  Subjective:    Patient ID: Barbara Kramer, female    DOB: 01/21/1951, 62 y.o.   MRN: [id number]  HPI CC: wheezing, cough and R arm pain  1d h/o cough and wheezing.  Bringing up mild sputum with cough.  Has tried mucinex.  H/o bronchitis in past, no h/o asthma.  Has used albuterol inh in past when with asthma.  + coryza and rhinorrhea for 1 day as well.  No fevers/chills.  No smokers at home.  R arm sore - tender for last several weeks.  denies inciting trauma.  Aches upper arm.  Worse pain with reaching across other arm.  Arm at times feels heavy.  Hasn't tried anything for this.  No neck pain, shooting pain down arm or tingling/numbness.  Denies weakness of arm.  H/o L shoulder arthritis.  Uses computer at work.  Architectural technologist.  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  Past Medical History  Diagnosis Date  . Eczema   . Obesity   . Family hx of colon cancer   . Breast lump     left  . Kidney cysts     small, right 10/2002  . Normal cardiac stress test 9/10    exercise stress test normal but limited by exercise intolerance    Review of Systems Per HPi    Objective:   Physical Exam  Nursing note and vitals reviewed. Constitutional: She appears well-developed and well-nourished. No distress.  HENT:  Head: Normocephalic and atraumatic.  Mouth/Throat: Oropharynx is clear and moist. No oropharyngeal exudate.  Eyes: Conjunctivae and EOM are normal. Pupils are equal, round, and reactive to light. No scleral icterus.  Neck: Normal range of motion. Neck supple.  Cardiovascular: Normal rate, regular rhythm, normal heart sounds and intact distal pulses.   No murmur heard. Pulmonary/Chest: Effort normal. No respiratory distress. She has wheezes (mild L lung wheezing). She has no rales.  Musculoskeletal: She exhibits no edema.       Neg spurling test. No midline spine tenderness, no trap tightness. FROM at shoulders and at neck. No deformity or  tenderness to palpation at shoulder. + mild tender to palpation at bicipital groove Neg Speed, yergason. Some weakness 2/2 pain with testing of SITS against resistance on right (empty can and ext rotation against resistance).  Lymphadenopathy:    She has no cervical adenopathy.  Skin: Skin is warm and dry. No rash noted.  Psychiatric: She has a normal mood and affect.      Assessment & Plan:

## 2011-06-05 NOTE — Assessment & Plan Note (Signed)
Anticipate mild RTC tendinopathy. Treat with scheduled NSAIDs , pt will use OTC NSAIDs and I will send in prescription strength naprosyn to pharmacy in case not improving with OTC meds. Provided with stretching/strengthening exercises from [facility] pt advisor.

## 2011-11-10 ENCOUNTER — Telehealth: Payer: Self-pay | Admitting: Family Medicine

## 2011-11-10 ENCOUNTER — Other Ambulatory Visit (INDEPENDENT_AMBULATORY_CARE_PROVIDER_SITE_OTHER): Payer: BC Managed Care – PPO

## 2011-11-10 DIAGNOSIS — Z0000 Encounter for general adult medical examination without abnormal findings: Secondary | ICD-10-CM

## 2011-11-10 LAB — CBC WITH DIFFERENTIAL/PLATELET
Basophils Absolute: 0 10*3/uL (ref 0.0–0.1)
Basophils Relative: 0.5 % (ref 0.0–3.0)
Eosinophils Absolute: 0.2 10*3/uL (ref 0.0–0.7)
Eosinophils Relative: 2.5 % (ref 0.0–5.0)
HCT: 40 % (ref 36.0–46.0)
Hemoglobin: 12.6 g/dL (ref 12.0–15.0)
Lymphocytes Relative: 27.6 % (ref 12.0–46.0)
Lymphs Abs: 2 10*3/uL (ref 0.7–4.0)
MCHC: 31.5 g/dL (ref 30.0–36.0)
MCV: 88.1 fl (ref 78.0–100.0)
Monocytes Absolute: 0.4 10*3/uL (ref 0.1–1.0)
Monocytes Relative: 4.9 % (ref 3.0–12.0)
Neutro Abs: 4.7 10*3/uL (ref 1.4–7.7)
Neutrophils Relative %: 64.5 % (ref 43.0–77.0)
Platelets: 236 10*3/uL (ref 150.0–400.0)
RBC: 4.53 Mil/uL (ref 3.87–5.11)
RDW: 15.2 % — ABNORMAL HIGH (ref 11.5–14.6)
WBC: 7.2 10*3/uL (ref 4.5–10.5)

## 2011-11-10 LAB — COMPREHENSIVE METABOLIC PANEL WITH GFR
ALT: 17 U/L (ref 0–35)
AST: 18 U/L (ref 0–37)
Albumin: 3.7 g/dL (ref 3.5–5.2)
Alkaline Phosphatase: 58 U/L (ref 39–117)
BUN: 13 mg/dL (ref 6–23)
CO2: 27 meq/L (ref 19–32)
Calcium: 9.5 mg/dL (ref 8.4–10.5)
Chloride: 106 meq/L (ref 96–112)
Creatinine, Ser: 0.9 mg/dL (ref 0.4–1.2)
GFR: 81.41 mL/min
Glucose, Bld: 90 mg/dL (ref 70–99)
Potassium: 4 meq/L (ref 3.5–5.1)
Sodium: 139 meq/L (ref 135–145)
Total Bilirubin: 0.5 mg/dL (ref 0.3–1.2)
Total Protein: 7 g/dL (ref 6.0–8.3)

## 2011-11-10 LAB — LIPID PANEL
Cholesterol: 138 mg/dL (ref 0–200)
LDL Cholesterol: 77 mg/dL (ref 0–99)
Total CHOL/HDL Ratio: 3
VLDL: 11.2 mg/dL (ref 0.0–40.0)

## 2011-11-10 LAB — TSH: TSH: 1.25 u[IU]/mL (ref 0.35–5.50)

## 2011-11-10 NOTE — Telephone Encounter (Signed)
Message copied by Judy Pimple on Fri Nov 10, 2011  7:38 AM ------      Message from: Alvina Chou      Created: Tue Nov 07, 2011 10:59 AM      Regarding: Lab orders for Friday, 8.23.13       Patient is scheduled for CPX labs, please order future labs, Thanks , Camelia Eng

## 2011-11-17 ENCOUNTER — Encounter: Payer: Self-pay | Admitting: Family Medicine

## 2011-11-17 ENCOUNTER — Ambulatory Visit (INDEPENDENT_AMBULATORY_CARE_PROVIDER_SITE_OTHER): Payer: BC Managed Care – PPO | Admitting: Family Medicine

## 2011-11-17 VITALS — BP 132/84 | HR 79 | Temp 98.7°F | Ht 64.0 in | Wt 268.2 lb

## 2011-11-17 DIAGNOSIS — Z1231 Encounter for screening mammogram for malignant neoplasm of breast: Secondary | ICD-10-CM

## 2011-11-17 DIAGNOSIS — J069 Acute upper respiratory infection, unspecified: Secondary | ICD-10-CM

## 2011-11-17 DIAGNOSIS — Z0000 Encounter for general adult medical examination without abnormal findings: Secondary | ICD-10-CM

## 2011-11-17 DIAGNOSIS — Z01419 Encounter for gynecological examination (general) (routine) without abnormal findings: Secondary | ICD-10-CM | POA: Insufficient documentation

## 2011-11-17 DIAGNOSIS — E669 Obesity, unspecified: Secondary | ICD-10-CM

## 2011-11-17 NOTE — Assessment & Plan Note (Signed)
Discussed how this problem influences overall health and the risks it imposes  Reviewed plan for weight loss with lower calorie diet (via better food choices and also portion control or program like weight watchers) and exercise building up to or more than 30 minutes 5 days per week including some aerobic activity   Pt is motivated and looking into wt watchers

## 2011-11-17 NOTE — Assessment & Plan Note (Signed)
This is mild with reassuring exam Disc symptomatic care - see instructions on AVS  Will use albuterol prn  If this becomes an ongoing problem- will consider spirometry and more long term tx -will let me know Urged her to get flu shot this season

## 2011-11-17 NOTE — Assessment & Plan Note (Signed)
Reviewed health habits including diet and exercise and skin cancer prevention Also reviewed health mt list, fam hx and immunizations   Wellness labs rev with pt today She is intent on working on wt loss for better health

## 2011-11-17 NOTE — Patient Instructions (Addendum)
We will schedule mammogram at check out  Try zyrtec 10 mg daily over the counter for allergy symptoms Also nasal saline spray Use inhaler as needed for cough/wheeze- and if this does not begin to improve in a week let me know  Work on diet and exercise for weight loss- weight watchers is a good plan

## 2011-11-17 NOTE — Assessment & Plan Note (Signed)
Scheduled annual screening mammogram Nl breast exam today  Encouraged monthly self exams   

## 2011-11-17 NOTE — Progress Notes (Signed)
Subjective:    Patient ID: Barbara Kramer, female    DOB: Jul 03, 1950, 61 y.o.   MRN: [id number]  HPI Here for health maintenance exam and to review chronic medical problems    Good summer overall  Lots of sinus trouble and thinks she has asthma  Using inhaler more -having coughing spells  Started out with a sore throat - used ricola losenges, is a little hoarse  Some stuffy nose , was runny and that is improved  Sinuses generally feel swollen  Also L ear is dry and irritated  occ uses vaseline in ear canal  She messes with it That ear stays stopped up   Using her asthma inhaler - this week 1-3 times per day    Wt is up 6 lb with bmi of 46 She has not weighed herself at home  Health habits - diet - not eating healthy foods (is french fry addict)- needs to get serious about her wt Is considering weight watchers Has a recumbant bike -- not using like she should Busy schedule   Pap 9/10 Partial hyst - for fibroids  Still has ovaries No gyn problems  Has never had abn paps   Flu shot-- does not get them  utd Td  mammo 2/12- is due for  Self exam--no lumps / but does not check like she should   colonosc 11/08    Chemistry      Component Value Date/Time   NA 139 11/10/2011 0916   K 4.0 11/10/2011 0916   CL 106 11/10/2011 0916   CO2 27 11/10/2011 0916   BUN 13 11/10/2011 0916   CREATININE 0.9 11/10/2011 0916      Component Value Date/Time   CALCIUM 9.5 11/10/2011 0916   ALKPHOS 58 11/10/2011 0916   AST 18 11/10/2011 0916   ALT 17 11/10/2011 0916   BILITOT 0.5 11/10/2011 0916     Lab Results  Component Value Date   CHOL 138 11/10/2011   HDL 49.40 11/10/2011   LDLCALC 77 11/10/2011   TRIG 56.0 11/10/2011   CHOLHDL 3 11/10/2011   Lab Results  Component Value Date   TSH 1.25 11/10/2011   Lab Results  Component Value Date   WBC 7.2 11/10/2011   HGB 12.6 11/10/2011   HCT 40.0 11/10/2011   MCV 88.1 11/10/2011   PLT 236.0 11/10/2011      Review of Systems Review of  Systems  Constitutional: Negative for fever, appetite change,  and unexpected weight change.  ENT pos for nasal cong and sneezing and st, neg for sinus pain Eyes: Negative for pain and visual disturbance.  Respiratory: Negative for sob, pos for cough and occas wheeze   Cardiovascular: Negative for cp or palpitations    Gastrointestinal: Negative for nausea, diarrhea and constipation.  Genitourinary: Negative for urgency and frequency.  Skin: Negative for pallor or rash   Neurological: Negative for weakness, light-headedness, numbness and headaches.  Hematological: Negative for adenopathy. Does not bruise/bleed easily.  Psychiatric/Behavioral: Negative for dysphoric mood. The patient is not nervous/anxious.         Objective:   Physical Exam  Constitutional: She is oriented to person, place, and time. She appears well-developed and well-nourished. No distress.       obese and well appearing   HENT:  Head: Normocephalic and atraumatic.  Mouth/Throat: Oropharynx is clear and moist.  Eyes: Conjunctivae and EOM are normal. Pupils are equal, round, and reactive to light. Right eye exhibits no discharge. Left eye exhibits  no discharge.  Neck: Normal range of motion. Neck supple. No JVD present. Carotid bruit is not present. No thyromegaly present.  Cardiovascular: Normal rate, regular rhythm, normal heart sounds and intact distal pulses.  Exam reveals no gallop.   Pulmonary/Chest: Effort normal and breath sounds normal. No respiratory distress. She has no wheezes.  Abdominal: Soft. Bowel sounds are normal. She exhibits no distension, no abdominal bruit and no mass. There is no tenderness.  Genitourinary: No breast swelling, tenderness, discharge or bleeding.       Breast exam: No mass, nodules, thickening, tenderness, bulging, retraction, inflamation, nipple discharge or skin changes noted.  No axillary or clavicular LA.  Chaperoned exam.    Musculoskeletal: Normal range of motion. She exhibits  no edema and no tenderness.  Lymphadenopathy:    She has no cervical adenopathy.  Neurological: She is alert and oriented to person, place, and time. She has normal reflexes. No cranial nerve deficit. She exhibits normal muscle tone. Coordination normal.  Skin: Skin is warm and dry. No rash noted. No erythema. No pallor.  Psychiatric: She has a normal mood and affect.          Assessment & Plan:

## 2011-11-23 ENCOUNTER — Telehealth: Payer: Self-pay

## 2011-11-23 NOTE — Telephone Encounter (Signed)
Pt request letter stating water aerobics would be beneficial for arthritis in right shoulder and back and would assist pt in weight loss. Pt request call back when letter ready; Insurance will reimburse pt cost at [facility] if has letter.Please advise.

## 2011-11-24 NOTE — Telephone Encounter (Signed)
Called patient left message on VM  to let her know that her letter was ready for pickup.

## 2011-11-24 NOTE — Telephone Encounter (Signed)
Letter in IN box 

## 2011-11-27 ENCOUNTER — Encounter: Payer: Self-pay | Admitting: Family Medicine

## 2011-11-30 ENCOUNTER — Encounter: Payer: Self-pay | Admitting: Family Medicine

## 2011-12-06 ENCOUNTER — Encounter: Payer: Self-pay | Admitting: *Deleted

## 2012-01-08 ENCOUNTER — Encounter: Payer: Self-pay | Admitting: *Deleted

## 2012-01-17 ENCOUNTER — Encounter: Payer: Self-pay | Admitting: Internal Medicine

## 2012-05-10 ENCOUNTER — Encounter (HOSPITAL_COMMUNITY): Payer: Self-pay | Admitting: *Deleted

## 2012-05-10 ENCOUNTER — Emergency Department (HOSPITAL_COMMUNITY)
Admission: EM | Admit: 2012-05-10 | Discharge: 2012-05-10 | Disposition: A | Discharge status: Home / Self Care | Payer: BC Managed Care – PPO | Attending: Emergency Medicine | Admitting: Emergency Medicine

## 2012-05-10 ENCOUNTER — Emergency Department (HOSPITAL_COMMUNITY): Payer: BC Managed Care – PPO

## 2012-05-10 DIAGNOSIS — Z872 Personal history of diseases of the skin and subcutaneous tissue: Secondary | ICD-10-CM | POA: Insufficient documentation

## 2012-05-10 DIAGNOSIS — Z79899 Other long term (current) drug therapy: Secondary | ICD-10-CM | POA: Insufficient documentation

## 2012-05-10 DIAGNOSIS — Z87448 Personal history of other diseases of urinary system: Secondary | ICD-10-CM | POA: Insufficient documentation

## 2012-05-10 DIAGNOSIS — Z8742 Personal history of other diseases of the female genital tract: Secondary | ICD-10-CM | POA: Insufficient documentation

## 2012-05-10 DIAGNOSIS — J45909 Unspecified asthma, uncomplicated: Secondary | ICD-10-CM | POA: Insufficient documentation

## 2012-05-10 DIAGNOSIS — Z9089 Acquired absence of other organs: Secondary | ICD-10-CM | POA: Insufficient documentation

## 2012-05-10 DIAGNOSIS — R112 Nausea with vomiting, unspecified: Secondary | ICD-10-CM

## 2012-05-10 DIAGNOSIS — Z87891 Personal history of nicotine dependence: Secondary | ICD-10-CM | POA: Insufficient documentation

## 2012-05-10 DIAGNOSIS — Z90711 Acquired absence of uterus with remaining cervical stump: Secondary | ICD-10-CM | POA: Insufficient documentation

## 2012-05-10 DIAGNOSIS — R109 Unspecified abdominal pain: Secondary | ICD-10-CM

## 2012-05-10 LAB — URINALYSIS, ROUTINE W REFLEX MICROSCOPIC
Glucose, UA: NEGATIVE mg/dL
Hgb urine dipstick: NEGATIVE
Specific Gravity, Urine: 1.018 (ref 1.005–1.030)
Urobilinogen, UA: 1 mg/dL (ref 0.0–1.0)

## 2012-05-10 LAB — COMPREHENSIVE METABOLIC PANEL
ALT: 20 U/L (ref 0–35)
AST: 30 U/L (ref 0–37)
Albumin: 3.7 g/dL (ref 3.5–5.2)
Calcium: 9.8 mg/dL (ref 8.4–10.5)
Sodium: 139 mEq/L (ref 135–145)
Total Protein: 7.8 g/dL (ref 6.0–8.3)

## 2012-05-10 LAB — CBC WITH DIFFERENTIAL/PLATELET
Basophils Absolute: 0 10*3/uL (ref 0.0–0.1)
Eosinophils Absolute: 0.1 10*3/uL (ref 0.0–0.7)
Eosinophils Relative: 1 % (ref 0–5)
MCH: 28.6 pg (ref 26.0–34.0)
MCV: 83.2 fL (ref 78.0–100.0)
Platelets: 249 10*3/uL (ref 150–400)
RDW: 14.2 % (ref 11.5–15.5)
WBC: 10.7 10*3/uL — ABNORMAL HIGH (ref 4.0–10.5)

## 2012-05-10 LAB — URINE MICROSCOPIC-ADD ON

## 2012-05-10 MED ORDER — PROMETHAZINE HCL 25 MG/ML IJ SOLN
12.5000 mg | Freq: Once | INTRAMUSCULAR | Status: AC
  Administered 2012-05-10: 12.5 mg via INTRAVENOUS
  Filled 2012-05-10: qty 1

## 2012-05-10 MED ORDER — IOHEXOL 300 MG/ML  SOLN
50.0000 mL | Freq: Once | INTRAMUSCULAR | Status: AC | PRN
  Administered 2012-05-10: 50 mL via ORAL

## 2012-05-10 MED ORDER — OMEPRAZOLE 20 MG PO CPDR: 20.0000 mg | DELAYED_RELEASE_CAPSULE | Freq: Every day | ORAL | Status: DC

## 2012-05-10 MED ORDER — ONDANSETRON HCL 4 MG/2ML IJ SOLN
4.0000 mg | Freq: Once | INTRAMUSCULAR | Status: AC
  Administered 2012-05-10: 4 mg via INTRAVENOUS
  Filled 2012-05-10: qty 2

## 2012-05-10 MED ORDER — IOHEXOL 300 MG/ML  SOLN
100.0000 mL | Freq: Once | INTRAMUSCULAR | Status: AC | PRN
  Administered 2012-05-10: 100 mL via INTRAVENOUS

## 2012-05-10 MED ORDER — ONDANSETRON 8 MG PO TBDP: ORAL_TABLET | ORAL | Status: DC

## 2012-05-10 MED ORDER — SODIUM CHLORIDE 0.9 % IV BOLUS (SEPSIS)
500.0000 mL | Freq: Once | INTRAVENOUS | Status: AC
  Administered 2012-05-10: 500 mL via INTRAVENOUS

## 2012-05-10 NOTE — ED Provider Notes (Signed)
History     CSN: [id number]  Arrival date & time 05/10/12  0309   First MD Initiated Contact with Patient 05/10/12 [phone]      Chief Complaint  Patient presents with  . Abdominal Pain   HPI  History provided by the patient. Patient is a 62 year old female with history of asthma, cholecystectomy and partial hysterectomy who presents with complaints of acute onset left side and abdominal pain. Symptoms awoke patient from sleep early this morning just prior to arrival. Pain is a tearing and burning sensation in the left side of the abdomen. Symptoms were associated with episodes of nausea and vomiting. Patient denies having similar symptoms previously. She denies having any bowel movement since the onset of symptoms. Denies any recent constipation issues. Denies any rectal bleeding. Patient does have history of colonoscopy in the past. Denies any prior history of polyps or diverticula.     Past Medical History  Diagnosis Date  . Eczema   . Obesity   . Family hx of colon cancer   . Breast lump     left  . Kidney cysts     small, right 10/2002  . Normal cardiac stress test 9/10    exercise stress test normal but limited by exercise intolerance    Past Surgical History  Procedure Laterality Date  . Cholecystectomy  05/2002    [place]- gallstones, small renal cyst 05/2002  . Partial hysterectomy  1998    fibroids, ovaries remain  . Foot surgery  11/1998    bunion    Family History  Problem Relation Age of Onset  . Colon cancer Mother   . Lung cancer Father     smoker  . Hypertension Sister   . Stroke Maternal Grandmother   . Stroke Paternal Grandmother   . Hypertension Sister   . Stomach cancer Mother     History  Substance Use Topics  . Smoking status: Former Smoker    Quit date: 03/20/1976  . Smokeless tobacco: Not on file  . Alcohol Use: No    OB History   Grav Para Term Preterm Abortions TAB SAB Ect Mult Living                  Review of Systems  Constitutional:  Negative for fever and chills.  Respiratory: Negative for shortness of breath.   Cardiovascular: Negative for chest pain and palpitations.  Gastrointestinal: Positive for nausea, vomiting and abdominal pain. Negative for diarrhea, constipation and blood in stool.  Genitourinary: Negative for dysuria, frequency, hematuria and flank pain.  All other systems reviewed and are negative.    Allergies  Calcium carbonate; Penicillins; and Calcium carbonate antacid  Home Medications   Current Outpatient Rx  Name  Route  Sig  Dispense  Refill  . albuterol (VENTOLIN HFA) 108 (90 BASE) MCG/ACT inhaler   Inhalation   Inhale 2 puffs into the lungs every 6 (six) hours as needed for wheezing.   1 Inhaler   3   . naproxen (NAPROSYN) 500 MG tablet      Take one po bid x 1 week then prn pain, take with food   40 tablet   0     BP 132/62  Pulse 80  Resp 20  SpO2 100%  Physical Exam  Nursing note and vitals reviewed. Constitutional: She is oriented to person, place, and time. She appears well-developed and well-nourished. No distress.  HENT:  Head: Normocephalic.  Eyes: Conjunctivae are normal.  Cardiovascular: Normal rate and  regular rhythm.   No murmur heard. Pulmonary/Chest: Effort normal and breath sounds normal. No respiratory distress. She has no wheezes.  Abdominal: Soft. There is tenderness in the left upper quadrant and left lower quadrant. There is no rebound, no guarding, no CVA tenderness, no tenderness at McBurney's point and negative Murphy's sign.  Morbidly obese. Exam is difficult and limited due to body habitus. No CVA tenderness.  Musculoskeletal: Normal range of motion.  Neurological: She is alert and oriented to person, place, and time.  Skin: Skin is warm and dry. No rash noted.  Psychiatric: She has a normal mood and affect. Her behavior is normal.    ED Course  Procedures   Results for orders placed during the hospital encounter of 05/10/12  CBC WITH  DIFFERENTIAL      Result Value Range   WBC 10.7 (*) 4.0 - 10.5 K/uL   RBC 4.65  3.87 - 5.11 MIL/uL   Hemoglobin 13.3  12.0 - 15.0 g/dL   HCT 16.1  09.6 - 04.5 %   MCV 83.2  78.0 - 100.0 fL   MCH 28.6  26.0 - 34.0 pg   MCHC 34.4  30.0 - 36.0 g/dL   RDW 40.9  81.1 - 91.4 %   Platelets 249  150 - 400 K/uL   Neutrophils Relative 75  43 - 77 %   Neutro Abs 8.1 (*) 1.7 - 7.7 K/uL   Lymphocytes Relative 20  12 - 46 %   Lymphs Abs 2.1  0.7 - 4.0 K/uL   Monocytes Relative 4  3 - 12 %   Monocytes Absolute 0.5  0.1 - 1.0 K/uL   Eosinophils Relative 1  0 - 5 %   Eosinophils Absolute 0.1  0.0 - 0.7 K/uL   Basophils Relative 0  0 - 1 %   Basophils Absolute 0.0  0.0 - 0.1 K/uL  COMPREHENSIVE METABOLIC PANEL      Result Value Range   Sodium 139  135 - 145 mEq/L   Potassium 4.9  3.5 - 5.1 mEq/L   Chloride 103  96 - 112 mEq/L   CO2 27  19 - 32 mEq/L   Glucose, Bld 143 (*) 70 - 99 mg/dL   BUN 14  6 - 23 mg/dL   Creatinine, Ser 7.82  0.50 - 1.10 mg/dL   Calcium 9.8  8.4 - 95.6 mg/dL   Total Protein 7.8  6.0 - 8.3 g/dL   Albumin 3.7  3.5 - 5.2 g/dL   AST 30  0 - 37 U/L   ALT 20  0 - 35 U/L   Alkaline Phosphatase 65  39 - 117 U/L   Total Bilirubin 0.2 (*) 0.3 - 1.2 mg/dL   GFR calc non Af Amer 70 (*) >90 mL/min   GFR calc Af Amer 81 (*) >90 mL/min  LIPASE, BLOOD      Result Value Range   Lipase 36  11 - 59 U/L  URINALYSIS, ROUTINE W REFLEX MICROSCOPIC      Result Value Range   Color, Urine YELLOW  YELLOW   APPearance CLOUDY (*) CLEAR   Specific Gravity, Urine 1.018  1.005 - 1.030   pH 7.5  5.0 - 8.0   Glucose, UA NEGATIVE  NEGATIVE mg/dL   Hgb urine dipstick NEGATIVE  NEGATIVE   Bilirubin Urine NEGATIVE  NEGATIVE   Ketones, ur NEGATIVE  NEGATIVE mg/dL   Protein, ur 30 (*) NEGATIVE mg/dL   Urobilinogen, UA 1.0  0.0 - 1.0  mg/dL   Nitrite NEGATIVE  NEGATIVE   Leukocytes, UA NEGATIVE  NEGATIVE  URINE MICROSCOPIC-ADD ON      Result Value Range   Squamous Epithelial / LPF MANY (*) RARE    Bacteria, UA MANY (*) RARE       Ct Abdomen Pelvis W Contrast  05/10/2012  *RADIOLOGY REPORT*  Clinical Data: Left-sided pain  CT ABDOMEN AND PELVIS WITH CONTRAST  Technique:  Multidetector CT imaging of the abdomen and pelvis was performed following the standard protocol during bolus administration of intravenous contrast.  Contrast: OMNIPAQUE IOHEXOL 300 MG/ML  SOLN  Comparison: None.  Findings: Mild bibasilar atelectasis.  Small cyst within the right lower lung.  Small hiatal hernia.  Heart size upper normal.  Unremarkable liver, spleen, pancreas, adrenal glands.  Status post cholecystectomy.  Mild CBD fullness is nonspecific in the post cholecystectomy state.  Tapers smoothly to the ampulla.  Interpolar right renal cyst.  Interpolar left renal cyst.  No hydronephrosis or hydroureter.  No CT evidence for colitis normal appendix.  Bowel loops are normal course and caliber.  No free intraperitoneal air or fluid.  No lymphadenopathy.  There is scattered atherosclerotic calcification of the aorta and its branches. No aneurysmal dilatation.  Thin-walled bladder.  Absent uterus.  Soft tissue abutting the vaginal cuff may correspond to a loop of small bowel.  No adnexal mass.  L4-5 degenerative disc disease and facet arthropathy.  No acute osseous finding.  IMPRESSION: No acute abdominopelvic process identified by CT.   Original Report Authenticated By: Jearld Lesch, M.D.      1. Nausea & vomiting   2. Abdominal pain       MDM  Patient seen and evaluated. Patient does not appear in any acute distress. She currently has refused pain medications.  Pt feeling better after iv fluids and meds.   Labs, CT and EKG unremarkable.  Pt discussed with Attending Physician.  At this time symptoms may be gastritis vs viral GI infection.  Pt will be given Rx for zofran and Prilosec.  Pt advised to have 24-48 recheck and given strict return precautions.     Date: 05/10/2012  Rate: 78  Rhythm: normal  sinus rhythm  QRS Axis: normal  Intervals: normal  ST/T Wave abnormalities: normal  Conduction Disutrbances:none  Narrative Interpretation:   Old EKG Reviewed: unchanged from 01/23/2011    Angus Seller, PA 05/10/12 [phone]

## 2012-05-10 NOTE — ED Notes (Signed)
Rn spoke with daughter and she is unable to get here until after 8am. Pt is drowsy. Pt will rest in room until daughter arrives- charge aware.

## 2012-05-10 NOTE — ED Notes (Signed)
CT called reporting pt was vomiting again, PA and MD made aware- orders to follow

## 2012-05-10 NOTE — ED Provider Notes (Signed)
Medical screening examination/treatment/procedure(s) were performed by non-physician practitioner and as supervising physician I was immediately available for consultation/collaboration.  Sunnie Nielsen, MD 05/10/12 [phone]

## 2012-05-10 NOTE — ED Notes (Signed)
Patient stated she was awakened for a feeling of tearing burning sensation in the LLQ and LUQ, vomited times 1 on the way here

## 2012-05-11 LAB — URINE CULTURE

## 2012-07-15 ENCOUNTER — Encounter: Payer: Self-pay | Admitting: Internal Medicine

## 2012-11-11 ENCOUNTER — Telehealth: Payer: Self-pay | Admitting: Family Medicine

## 2012-11-11 DIAGNOSIS — Z0000 Encounter for general adult medical examination without abnormal findings: Secondary | ICD-10-CM

## 2012-11-11 NOTE — Telephone Encounter (Signed)
Message copied by Judy Pimple on Mon Nov 11, 2012  7:58 AM ------      Message from: Alvina Chou      Created: Wed Nov 06, 2012  3:13 PM      Regarding: Lab orders for Tuesday, 8.26.14       Patient is scheduled for CPX labs, please order future labs, Thanks , Terri       ------

## 2012-11-13 ENCOUNTER — Other Ambulatory Visit (INDEPENDENT_AMBULATORY_CARE_PROVIDER_SITE_OTHER): Payer: BC Managed Care – PPO

## 2012-11-13 DIAGNOSIS — Z0000 Encounter for general adult medical examination without abnormal findings: Secondary | ICD-10-CM

## 2012-11-13 LAB — CBC WITH DIFFERENTIAL/PLATELET
Basophils Relative: 0.5 % (ref 0.0–3.0)
Eosinophils Relative: 2.5 % (ref 0.0–5.0)
HCT: 39 % (ref 36.0–46.0)
MCV: 85.4 fl (ref 78.0–100.0)
Monocytes Absolute: 0.4 10*3/uL (ref 0.1–1.0)
Monocytes Relative: 5 % (ref 3.0–12.0)
Neutrophils Relative %: 56.4 % (ref 43.0–77.0)
Platelets: 256 10*3/uL (ref 150.0–400.0)
RBC: 4.56 Mil/uL (ref 3.87–5.11)
WBC: 7.7 10*3/uL (ref 4.5–10.5)

## 2012-11-14 LAB — COMPREHENSIVE METABOLIC PANEL
Albumin: 3.9 g/dL (ref 3.5–5.2)
BUN: 16 mg/dL (ref 6–23)
CO2: 26 mEq/L (ref 19–32)
Calcium: 9.1 mg/dL (ref 8.4–10.5)
GFR: 74.48 mL/min (ref >60.00)
Glucose, Bld: 92 mg/dL (ref 70–99)
Potassium: 3.8 mEq/L (ref 3.5–5.1)
Sodium: 136 mEq/L (ref 135–145)
Total Protein: 7.3 g/dL (ref 6.0–8.3)

## 2012-11-14 LAB — LIPID PANEL
Cholesterol: 135 mg/dL (ref 0–200)
Triglycerides: 52 mg/dL (ref 0.0–149.0)

## 2012-11-20 ENCOUNTER — Encounter: Payer: Self-pay | Admitting: Family Medicine

## 2012-11-20 ENCOUNTER — Ambulatory Visit (INDEPENDENT_AMBULATORY_CARE_PROVIDER_SITE_OTHER): Payer: BC Managed Care – PPO | Admitting: Family Medicine

## 2012-11-20 VITALS — BP 130/70 | HR 84 | Temp 98.2°F | Ht 62.75 in | Wt 276.0 lb

## 2012-11-20 DIAGNOSIS — Z1211 Encounter for screening for malignant neoplasm of colon: Secondary | ICD-10-CM

## 2012-11-20 DIAGNOSIS — Z0000 Encounter for general adult medical examination without abnormal findings: Secondary | ICD-10-CM

## 2012-11-20 DIAGNOSIS — E669 Obesity, unspecified: Secondary | ICD-10-CM

## 2012-11-20 NOTE — Patient Instructions (Addendum)
For weight loss - try to exercise 5 days per week 30 minutes or more - bike or water exercise is best  Look for the "my fitness pal" app for your phone - it is a good start to counting calories for weight loss  Also weight watchers is the most tried and true program  Labs look good  Please do the IFOB stool card for colon cancer screening - and let us know when you are ready to do your colonoscopy

## 2012-11-20 NOTE — Progress Notes (Signed)
Subjective:    Patient ID: Barbara Kramer, female    DOB: 1950-04-05, 62 y.o.   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 - had a cortisone shot  Knees stay stiff   Eating - is fair  Portions may be too big when she eats out (especially at a buffet) She eats too fast  Sometimes she eats junk food - loves chips/ fritos/ some candy  She did get a recumbent bike -- and she needs to start using it - needs to start using it  She has tried water exercise in the past and she really liked it   Pap 9/10 Had a hysterectomy for fibroids Gyn -no problems at all   Mammogram 9/13  Got a reminder at home  No lumps on self exam   Td 5/08  colonosc 11/08- recall 5 year- she knows she is due  Unfortunately her insurance does not pay well for that - so not ready Will do IFOB card   Mood - is overall good , stays chipper   Labs:  Lab Results  Component Value Date   CHOL 135 11/13/2012   CHOL 138 11/10/2011   CHOL 148 10/12/2010   Lab Results  Component Value Date   HDL 45.50 11/13/2012   HDL 40.98 11/10/2011   HDL 11.91 10/12/2010   Lab Results  Component Value Date   LDLCALC 79 11/13/2012   LDLCALC 77 11/10/2011   LDLCALC 80 10/12/2010   Lab Results  Component Value Date   TRIG 52.0 11/13/2012   TRIG 56.0 11/10/2011   TRIG 79.0 10/12/2010   Lab Results  Component Value Date   CHOLHDL 3 11/13/2012   CHOLHDL 3 11/10/2011   CHOLHDL 3 10/12/2010   No results found for this basename: LDLDIRECT      Review of Systems Review of Systems  Constitutional: Negative for fever, appetite change, fatigue and unexpected weight change.  Eyes: Negative for pain and visual disturbance.  Respiratory: Negative for cough and shortness of breath.    Cardiovascular: Negative for cp or palpitations    Gastrointestinal: Negative for nausea, diarrhea and constipation.  Genitourinary: Negative for urgency and frequency.  Skin: Negative for pallor or rash MSK pos for knee and back pain    Neurological: Negative for weakness, light-headedness, numbness and headaches.  Hematological: Negative for adenopathy. Does not bruise/bleed easily.  Psychiatric/Behavioral: Negative for dysphoric mood. The patient is not nervous/anxious.         Objective:   Physical Exam  Constitutional: She appears well-developed and well-nourished. No distress.  obese and well appearing   HENT:  Head: Normocephalic and atraumatic.  Right Ear: External ear normal.  Left Ear: External ear normal.  Nose: Nose normal.  Mouth/Throat: Oropharynx is clear and moist. No oropharyngeal exudate.  Eyes: Conjunctivae and EOM are normal. Pupils are equal, round, and reactive to light. Right eye exhibits no discharge. Left eye exhibits no discharge. No scleral icterus.  Neck: Normal range of motion. Neck supple. No JVD present. Carotid bruit is not present. No thyromegaly present.  Cardiovascular: Normal rate, regular rhythm, normal heart sounds and intact distal pulses.  Exam reveals no gallop.  Pulmonary/Chest: Effort normal and breath sounds normal. No respiratory distress. She has no wheezes.  Abdominal: Soft. Bowel sounds are normal. She exhibits no distension, no abdominal bruit and no mass. There is no tenderness.  Genitourinary: No breast swelling, tenderness, discharge or bleeding.  Breast exam: No mass, nodules, thickening, tenderness, bulging, retraction, inflamation, nipple discharge or skin changes noted.  No axillary or clavicular LA.  Chaperoned exam.    Musculoskeletal: Normal range of motion. She exhibits no edema and no tenderness.  Lymphadenopathy:    She has no cervical adenopathy.  Neurological: She is alert. She has normal reflexes. No cranial nerve  deficit. She exhibits normal muscle tone. Coordination normal.  Skin: Skin is warm and dry. No rash noted. No erythema. No pallor.  Psychiatric: She has a normal mood and affect.          Assessment & Plan:

## 2012-11-21 NOTE — Assessment & Plan Note (Signed)
Discussed how this problem influences overall health and the risks it imposes  Reviewed plan for weight loss with lower calorie diet (via better food choices and also portion control or program like weight watchers) and exercise building up to or more than 30 minutes 5 days per week including some aerobic activity   she is motivated now  Disc programs like my fitness pal and wt watchers

## 2012-11-21 NOTE — Assessment & Plan Note (Signed)
Pt cannot afford 5 y f/u colonosc Given ifob card  inst to let us know when she can save enough to pay for colonosc and we will set it up

## 2012-11-21 NOTE — Assessment & Plan Note (Signed)
Reviewed health habits including diet and exercise and skin cancer prevention Also reviewed health mt list, fam hx and immunizations  Disc need for wt loss Wellness labs reviewed  Pt declines flu shots -counseled on this  She cannot afford her colonoscopy which is due for 5 y recall so given IFOB card

## 2013-03-11 ENCOUNTER — Encounter: Payer: Self-pay | Admitting: Family Medicine

## 2013-03-11 ENCOUNTER — Ambulatory Visit (INDEPENDENT_AMBULATORY_CARE_PROVIDER_SITE_OTHER): Payer: No Typology Code available for payment source | Admitting: Family Medicine

## 2013-03-11 VITALS — BP 128/78 | HR 77 | Temp 97.7°F | Ht 62.75 in | Wt 274.0 lb

## 2013-03-11 DIAGNOSIS — M629 Disorder of muscle, unspecified: Secondary | ICD-10-CM

## 2013-03-11 DIAGNOSIS — M6289 Other specified disorders of muscle: Secondary | ICD-10-CM | POA: Insufficient documentation

## 2013-03-11 NOTE — Assessment & Plan Note (Signed)
With pain in ams/ stiffness  Disc stretching and use of heat  Ref to PT eval and treat  Update if no imp  Enc continued use of exercise bike as tolerated

## 2013-03-11 NOTE — Patient Instructions (Signed)
Use heat or hot shower in am to loosen them up  Keep using bike if it helps  Aleve may help pain - up to 2 pills twice daily with food  We will refer you to Physical Therapy at check out

## 2013-03-11 NOTE — Progress Notes (Signed)
Subjective:    Patient ID: Barbara Kramer, female    DOB: 10/23/1950, 62 y.o.   MRN: [id number]  HPI Here for leg pain - both sides (back is ok )  Wakes up with pain - back of thighs- and buttocks - so stiff and sore she can barely get going in the am  Also hard to get in and out of tub Trying recumbent bike - to loosen up   No knee pain  No hip or groin pain   Most comfortable position is sitting bent forward  She sleeps on the couch a good deal of the time - goes to sleep watching TV   Known OA in her back   No swelling of legs   Once in a while she gets a bit light headed Working long hours  A bit of stress    Patient Active Problem List   Diagnosis Date Noted  . Colon cancer screening 11/20/2012  . Routine gynecological examination 11/17/2011  . Other screening mammogram 11/17/2011  . Low back pain 10/18/2010  . Routine general medical examination at a health care facility 10/06/2010  . HEADACHE 08/10/2009  . 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 ALLERGIC RHINITIS 08/16/2007  . OBESITY 08/02/2006  . ECZEMA 08/02/2006   Past Medical History  Diagnosis Date  . Eczema   . Obesity   . Family hx of colon cancer   . Breast lump     left  . Kidney cysts     small, right 10/2002  . Normal cardiac stress test 9/10    exercise stress test normal but limited by exercise intolerance   Past Surgical History  Procedure Laterality Date  . Cholecystectomy  05/2002    [place]- gallstones, small renal cyst 05/2002  . Partial hysterectomy  1998    fibroids, ovaries remain  . Foot surgery  11/1998    bunion   History  Substance Use Topics  . Smoking status: Former Smoker    Quit date: 03/20/1976  . Smokeless tobacco: Not on file  . Alcohol Use: No   Family History  Problem Relation Age of Onset  . Colon cancer Mother   . Lung cancer Father     smoker  . Hypertension Sister   . Stroke Maternal Grandmother   . Stroke Paternal Grandmother   . Hypertension Sister   .  Stomach cancer Mother    Allergies  Allergen Reactions  . Calcium Carbonate     REACTION: constipation  . Penicillins     REACTION: rash  . Calcium Carbonate Antacid Rash   Current Outpatient Prescriptions on File Prior to Visit  Medication Sig Dispense Refill  . [DISCONTINUED] famotidine (PEPCID) 20 MG tablet Take 1 tablet (20 mg total) by mouth 2 (two) times daily.  30 tablet  0   No current facility-administered medications on file prior to visit.    Review of Systems Review of Systems  Constitutional: Negative for fever, appetite change, fatigue and unexpected weight change.  Eyes: Negative for pain and visual disturbance.  Respiratory: Negative for cough and shortness of breath.   Cardiovascular: Negative for cp or palpitations    Gastrointestinal: Negative for nausea, diarrhea and constipation.  Genitourinary: Negative for urgency and frequency.  Skin: Negative for pallor or rash   mSK neg for back pain or joint swelling  Neurological: Negative for weakness, light-headedness, numbness and headaches.  Hematological: Negative for adenopathy. Does not bruise/bleed easily.  Psychiatric/Behavioral: Negative for dysphoric mood. The patient is  not nervous/anxious.         Objective:   Physical Exam  Constitutional: She appears well-developed and well-nourished. No distress.  Morbidly obese and well app  HENT:  Head: Normocephalic and atraumatic.  Neck: Normal range of motion. Neck supple.  Cardiovascular: Normal rate, regular rhythm and intact distal pulses.   Musculoskeletal: She exhibits tenderness. She exhibits no edema.  Pt has some baseline lymphedema in legs / is also morbidly obese Tender mildly over hamstrings - however better flexibility than expected  Nl gait No LS or other bony tenderness No varicosities or palp cords   Skin: Skin is warm and dry. No rash noted. No erythema.  Psychiatric: She has a normal mood and affect.          Assessment & Plan:

## 2013-03-11 NOTE — Progress Notes (Signed)
Pre-visit discussion using our clinic review tool. No additional management support is needed unless otherwise documented below in the visit note.  

## 2013-04-24 ENCOUNTER — Encounter: Payer: Self-pay | Admitting: Internal Medicine

## 2013-06-02 ENCOUNTER — Ambulatory Visit (INDEPENDENT_AMBULATORY_CARE_PROVIDER_SITE_OTHER): Payer: No Typology Code available for payment source | Admitting: Internal Medicine

## 2013-06-02 ENCOUNTER — Encounter: Payer: Self-pay | Admitting: Internal Medicine

## 2013-06-02 VITALS — BP 120/70 | HR 86 | Temp 98.7°F | Wt 272.0 lb

## 2013-06-02 DIAGNOSIS — R52 Pain, unspecified: Secondary | ICD-10-CM

## 2013-06-02 DIAGNOSIS — J988 Other specified respiratory disorders: Secondary | ICD-10-CM

## 2013-06-02 DIAGNOSIS — R059 Cough, unspecified: Secondary | ICD-10-CM

## 2013-06-02 DIAGNOSIS — B9789 Other viral agents as the cause of diseases classified elsewhere: Secondary | ICD-10-CM

## 2013-06-02 DIAGNOSIS — R05 Cough: Secondary | ICD-10-CM

## 2013-06-02 LAB — POCT INFLUENZA A/B
Influenza A, POC: NEGATIVE
Influenza B, POC: NEGATIVE

## 2013-06-02 NOTE — Progress Notes (Signed)
Pre visit review using our clinic review tool, if applicable. No additional management support is needed unless otherwise documented below in the visit note. 

## 2013-06-02 NOTE — Patient Instructions (Addendum)
Viral Infections °A virus is a type of germ. Viruses can cause: °· Minor sore throats. °· Aches and pains. °· Headaches. °· Runny nose. °· Rashes. °· Watery eyes. °· Tiredness. °· Coughs. °· Loss of appetite. °· Feeling sick to your stomach (nausea). °· Throwing up (vomiting). °· Watery poop (diarrhea). °HOME CARE  °· Only take medicines as told by your doctor. °· Drink enough water and fluids to keep your pee (urine) clear or pale yellow. Sports drinks are a good choice. °· Get plenty of rest and eat healthy. Soups and broths with crackers or rice are fine. °GET HELP RIGHT AWAY IF:  °· You have a very bad headache. °· You have shortness of breath. °· You have chest pain or neck pain. °· You have an unusual rash. °· You cannot stop throwing up. °· You have watery poop that does not stop. °· You cannot keep fluids down. °· You or your child has a temperature by mouth above 102° F (38.9° C), not controlled by medicine. °· Your baby is older than 3 months with a rectal temperature of 102° F (38.9° C) or higher. °· Your baby is 3 months old or younger with a rectal temperature of 100.4° F (38° C) or higher. °MAKE SURE YOU:  °· Understand these instructions. °· Will watch this condition. °· Will get help right away if you are not doing well or get worse. °Document Released: 02/17/2008 Document Revised: 05/29/2011 Document Reviewed: 07/12/2010 °ExitCare® Patient Information ©2014 [employer]. ° °

## 2013-06-02 NOTE — Progress Notes (Signed)
HPI  Pt presents to the clinic today with c/o cough, chest congestion, sore throat, chills and body aches. She reports this started about 3 days ago. The cough is nonproductive. She has taken Mucinex DM OTC. She has no history of allergies or breathing problems. She has not had sick contacts that she is aware of.   Review of Systems      Past Medical History  Diagnosis Date  . Eczema   . Obesity   . Family hx of colon cancer   . Breast lump     left  . Kidney cysts     small, right 10/2002  . Normal cardiac stress test 9/10    exercise stress test normal but limited by exercise intolerance    Family History  Problem Relation Age of Onset  . Colon cancer Mother   . Lung cancer Father     smoker  . Hypertension Sister   . Stroke Maternal Grandmother   . Stroke Paternal Grandmother   . Hypertension Sister   . Stomach cancer Mother     History   Social History  . Marital Status: Single    Spouse Name: N/A    Number of Children: 2  . Years of Education: N/A   Occupational History  .  [place] History Main Topics  . Smoking status: Former Smoker    Quit date: 03/20/1976  . Smokeless tobacco: Not on file  . Alcohol Use: No  . Drug Use: No  . Sexual Activity: Not on file   Other Topics Concern  . Not on file   Social History Narrative  . No narrative on file    Allergies  Allergen Reactions  . Calcium Carbonate     REACTION: constipation  . Penicillins     REACTION: rash  . Calcium Carbonate Antacid Rash     Constitutional: Positive headache, fatigueDenies fever, abrupt weight changes.  HEENT:  Positive sore throat. Denies eye redness, eye pain, pressure behind the eyes, facial pain, nasal congestion, ear pain, ringing in the ears, wax buildup, runny nose or bloody nose. Respiratory: Positive cough. Denies difficulty breathing or shortness of breath.  Cardiovascular: Denies chest pain, chest tightness, palpitations or swelling in the  hands or feet.   No other specific complaints in a complete review of systems (except as listed in HPI above).  Objective:   BP 120/70  Pulse 86  Temp(Src) 98.7 F (37.1 C) (Oral)  Wt 272 lb (123.378 kg)  SpO2 98% Wt Readings from Last 3 Encounters:  06/02/13 272 lb (123.378 kg)  03/11/13 274 lb (124.286 kg)  11/20/12 276 lb (125.193 kg)     General: Appears her stated age, obese but well developed, well nourished in NAD. HEENT: Head: normal shape and size; Eyes: sclera white, no icterus, conjunctiva pink, PERRLA and EOMs intact; Ears: Tm's gray and intact, normal light reflex; Nose: mucosa pink and moist, septum midline; Throat/Mouth: + PND. Teeth present, mucosa erythematous and moist, no exudate noted, no lesions or ulcerations noted.  Neck:. Neck supple, trachea midline. No massses, lumps or thyromegaly present.  Cardiovascular: Normal rate and rhythm. S1,S2 noted.  No murmur, rubs or gallops noted. No JVD or BLE edema. No carotid bruits noted. Pulmonary/Chest: Normal effort and positive vesicular breath sounds. No respiratory distress. No wheezes, rales or ronchi noted.      Assessment & Plan:   Viral Respiratory Infection:  Rapid Flu: negative Get some rest and drink plenty of water  Do salt water gargles for the sore throat Continue supportive care- Mucinex, cough suppressant  RTC as needed or if symptoms persist.

## 2013-08-05 NOTE — Telephone Encounter (Signed)
ERROR

## 2013-08-20 ENCOUNTER — Encounter: Payer: Self-pay | Admitting: Family Medicine

## 2013-08-21 ENCOUNTER — Encounter: Payer: Self-pay | Admitting: Family Medicine

## 2013-10-15 ENCOUNTER — Ambulatory Visit (INDEPENDENT_AMBULATORY_CARE_PROVIDER_SITE_OTHER)
Admission: RE | Admit: 2013-10-15 | Discharge: 2013-10-15 | Disposition: A | Discharge status: Home / Self Care | Payer: No Typology Code available for payment source | Source: Ambulatory Visit | Attending: Family Medicine | Admitting: Family Medicine

## 2013-10-15 ENCOUNTER — Encounter: Payer: Self-pay | Admitting: Family Medicine

## 2013-10-15 ENCOUNTER — Ambulatory Visit (INDEPENDENT_AMBULATORY_CARE_PROVIDER_SITE_OTHER): Payer: No Typology Code available for payment source | Admitting: Family Medicine

## 2013-10-15 VITALS — BP 116/62 | HR 77 | Temp 98.5°F | Ht 62.75 in | Wt 280.5 lb

## 2013-10-15 DIAGNOSIS — M25562 Pain in left knee: Secondary | ICD-10-CM | POA: Insufficient documentation

## 2013-10-15 DIAGNOSIS — M25569 Pain in unspecified knee: Secondary | ICD-10-CM

## 2013-10-15 NOTE — Assessment & Plan Note (Signed)
No swelling or trauma Morbid obesity  Lateral joint line -worse with walking, OA is in the differential  Xray today  Ice /elevate prn  Will update with result and plan

## 2013-10-15 NOTE — Patient Instructions (Signed)
Xray of left knee today  Try to put ice on your knee whenever you can  Work on weight loss to minimize pressure on the knee  You can wrap it or use a soft knee brace if necessary  We will update you with a result and plan

## 2013-10-15 NOTE — Progress Notes (Signed)
Pre visit review using our clinic review tool, if applicable. No additional management support is needed unless otherwise documented below in the visit note. 

## 2013-10-15 NOTE — Progress Notes (Signed)
Subjective:    Patient ID: Barbara Kramer, female    DOB: 08-25-1950, 63 y.o.   MRN: [id number]  HPI Here with pain in her L leg when she walks - pt points to the lateral side of her knee  Dull ache Occasionally feels unstable while walking  No injuries Not swollen   No new or increased activity except some walking in place   Wt is up 8 lb with bmi of 50   No med for pain  Does not hurt unless she walks   Patient Active Problem List   Diagnosis Date Noted  . Left knee pain 10/15/2013  . Colon cancer screening 11/20/2012  . Routine gynecological examination 11/17/2011  . Other screening mammogram 11/17/2011  . Low back pain 10/18/2010  . Routine general medical examination at a health care facility 10/06/2010  . HEADACHE 08/10/2009  . 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 ALLERGIC RHINITIS 08/16/2007  . OBESITY 08/02/2006  . ECZEMA 08/02/2006   Past Medical History  Diagnosis Date  . Eczema   . Obesity   . Family hx of colon cancer   . Breast lump     left  . Kidney cysts     small, right 10/2002  . Normal cardiac stress test 9/10    exercise stress test normal but limited by exercise intolerance   Past Surgical History  Procedure Laterality Date  . Cholecystectomy  05/2002    [place]- gallstones, small renal cyst 05/2002  . Partial hysterectomy  1998    fibroids, ovaries remain  . Foot surgery  11/1998    bunion   History  Substance Use Topics  . Smoking status: Former Smoker    Quit date: 03/20/1976  . Smokeless tobacco: Not on file  . Alcohol Use: No   Family History  Problem Relation Age of Onset  . Colon cancer Mother   . Lung cancer Father     smoker  . Hypertension Sister   . Stroke Maternal Grandmother   . Stroke Paternal Grandmother   . Hypertension Sister   . Stomach cancer Mother    Allergies  Allergen Reactions  . Calcium Carbonate     REACTION: constipation  . Penicillins     REACTION: rash  . Calcium Carbonate Antacid Rash    Current Outpatient Prescriptions on File Prior to Visit  Medication Sig Dispense Refill  . [DISCONTINUED] famotidine (PEPCID) 20 MG tablet Take 1 tablet (20 mg total) by mouth 2 (two) times daily.  30 tablet  0   No current facility-administered medications on file prior to visit.    Review of Systems    Review of Systems  Constitutional: Negative for fever, appetite change, fatigue and unexpected weight change.  Eyes: Negative for pain and visual disturbance.  Respiratory: Negative for cough and shortness of breath.   Cardiovascular: Negative for cp or palpitations    Gastrointestinal: Negative for nausea, diarrhea and constipation.  Genitourinary: Negative for urgency and frequency.  Skin: Negative for pallor or rash   MSK pos for L knee pain when walking , neg for redness or swelling of the knee  Neurological: Negative for weakness, light-headedness, numbness and headaches.  Hematological: Negative for adenopathy. Does not bruise/bleed easily.  Psychiatric/Behavioral: Negative for dysphoric mood. The patient is not nervous/anxious.      Objective:   Physical Exam  Constitutional: She appears well-developed and well-nourished. No distress.  Morbidly obese and well appearing   HENT:  Head: Normocephalic and atraumatic.  Eyes:  Pupils are equal, round, and reactive to light.  Neck: Normal range of motion. Neck supple.  Cardiovascular: Normal rate, regular rhythm and intact distal pulses.   Musculoskeletal: She exhibits tenderness. She exhibits no edema.       Left knee: She exhibits normal range of motion, no swelling, no effusion, no ecchymosis, no deformity, no erythema, no LCL laxity, normal patellar mobility, no bony tenderness, normal meniscus and no MCL laxity. Tenderness found. Lateral joint line and patellar tendon tenderness noted.  L knee-no crepitus  Difficult to assess for swelling/ eff due to obesity No limp Nl ant drawer and lachman tests    Neurological: She is  alert. She has normal reflexes.  Skin: Skin is warm and dry. No rash noted. No erythema.  Psychiatric: She has a normal mood and affect.          Assessment & Plan:   Problem List Items Addressed This Visit     Other   Left knee pain - Primary     No swelling or trauma Morbid obesity  Lateral joint line -worse with walking, OA is in the differential  Xray today  Ice /elevate prn  Will update with result and plan     Relevant Orders      DG Knee Complete 4 Views Left (Completed)

## 2013-10-16 ENCOUNTER — Telehealth: Payer: Self-pay | Admitting: Family Medicine

## 2013-10-16 DIAGNOSIS — M1712 Unilateral primary osteoarthritis, left knee: Secondary | ICD-10-CM

## 2013-10-16 NOTE — Telephone Encounter (Signed)
Pt called to request the results of xrays, I advise pt Dr. Glori Bickers has to review them and that we would call pt back, please advise

## 2013-10-16 NOTE — Telephone Encounter (Signed)
done

## 2013-10-16 NOTE — Telephone Encounter (Signed)
Message copied by Abner Greenspan on Thu Oct 16, 2013 11:54 AM ------      Message from: Tammi Sou      Created: Thu Oct 16, 2013 10:59 AM       Pt notified of xray results and Dr. Marliss Coots comments. Pt does want to see orthopedic doctor. Pt said she has been to the [facility] in [place] and saw their ortho docs, and she would like to go back to the [facility] in [place] if possible, please put referral in, I advise pt that Marion/Linda will call pt to schedule appt ------

## 2013-10-16 NOTE — Telephone Encounter (Signed)
I left her a mychart note and sent Shapale a result note

## 2014-05-26 ENCOUNTER — Ambulatory Visit: Payer: No Typology Code available for payment source | Admitting: Family Medicine

## 2014-05-26 ENCOUNTER — Ambulatory Visit (INDEPENDENT_AMBULATORY_CARE_PROVIDER_SITE_OTHER): Payer: No Typology Code available for payment source | Admitting: Family Medicine

## 2014-05-26 ENCOUNTER — Encounter: Payer: Self-pay | Admitting: Family Medicine

## 2014-05-26 VITALS — BP 122/70 | HR 90 | Temp 98.3°F | Wt 273.8 lb

## 2014-05-26 DIAGNOSIS — J0100 Acute maxillary sinusitis, unspecified: Secondary | ICD-10-CM

## 2014-05-26 DIAGNOSIS — M79602 Pain in left arm: Secondary | ICD-10-CM

## 2014-05-26 DIAGNOSIS — J019 Acute sinusitis, unspecified: Secondary | ICD-10-CM | POA: Insufficient documentation

## 2014-05-26 MED ORDER — AZITHROMYCIN 250 MG PO TABS: ORAL_TABLET | ORAL | Status: DC

## 2014-05-26 NOTE — Progress Notes (Signed)
Pre visit review using our clinic review tool, if applicable. No additional management support is needed unless otherwise documented below in the visit note. 

## 2014-05-26 NOTE — Progress Notes (Signed)
Subjective:    Patient ID: Barbara Kramer, female    DOB: [birth year], 64 y.o.   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 Osteoarthritis of left knee 10/16/2013  . Left knee pain 10/15/2013  . Colon cancer screening 11/20/2012  . Routine gynecological examination 11/17/2011  . Other screening mammogram 11/17/2011  . Low back pain 10/18/2010  . Routine general medical examination at a health care facility 10/06/2010  . HEADACHE 08/10/2009  . 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 ALLERGIC RHINITIS 08/16/2007  . OBESITY 08/02/2006  . ECZEMA 08/02/2006   Past Medical History  Diagnosis Date  . Eczema   . Obesity   . Family hx of colon cancer   . Breast lump     left  . Kidney cysts     small, right 10/2002  . Normal cardiac stress test 9/10    exercise stress test normal but limited by exercise intolerance   Past Surgical History  Procedure Laterality Date  . Cholecystectomy  05/2002    [place]- gallstones, small renal cyst 05/2002  . Partial hysterectomy  1998    fibroids, ovaries remain  . Foot surgery  11/1998    bunion   History  Substance Use Topics  . Smoking status: Former Smoker    Quit date: 03/20/1976  . Smokeless tobacco: Not on file  . Alcohol Use: No   Family History  Problem Relation Age of Onset  . Colon cancer Mother   . Lung cancer Father     smoker  . Hypertension Sister   . Stroke Maternal Grandmother   . Stroke Paternal  Grandmother   . Hypertension Sister   . Stomach cancer Mother    Allergies  Allergen Reactions  . Calcium Carbonate     REACTION: constipation  . Penicillins     REACTION: rash  . Calcium Carbonate Antacid Rash   Current Outpatient Prescriptions on File Prior to Visit  Medication Sig Dispense Refill  . [DISCONTINUED] famotidine (PEPCID) 20 MG tablet Take 1 tablet (20 mg total) by mouth 2 (two) times daily. 30 tablet 0   No current facility-administered medications on file prior to visit.    Review of Systems Review of Systems  Constitutional: Negative for fever, appetite change, and unexpected weight change.  ENT pos for cong/ rhinorrhea and st and sinus pain  Eyes: Negative for pain and visual disturbance.  Respiratory: Negative for wheeze  and shortness of breath.   Cardiovascular: Negative for cp or palpitations    Gastrointestinal: Negative for nausea, diarrhea and constipation.  Genitourinary: Negative for urgency and frequency.  MSK pos for L arm/shoulder pain w/o swelling  Skin: Negative for pallor or rash   Neurological: Negative for weakness, light-headedness, numbness and headaches.  Hematological: Negative for adenopathy. Does not bruise/bleed easily.  Psychiatric/Behavioral: Negative for dysphoric mood. The  patient is not nervous/anxious.         Objective:   Physical Exam  Constitutional: She appears well-developed and well-nourished. No distress.  obese and well appearing   HENT:  Head: Normocephalic and atraumatic.  Right Ear: External ear normal.  Left Ear: External ear normal.  Mouth/Throat: Oropharynx is clear and moist. No oropharyngeal exudate.  Nares are injected and congested  Bilateral mild maxillary sinus tenderness  Throat - clear drainage /no swelling or erythema   Eyes: Conjunctivae and EOM are normal. Pupils are equal, round, and reactive to light. Right eye exhibits no discharge. Left eye exhibits no discharge.  Neck: Normal range of  motion. Neck supple.  Cardiovascular: Normal rate and regular rhythm.   Pulmonary/Chest: Effort normal and breath sounds normal. No respiratory distress. She has no wheezes. She has no rales.  Musculoskeletal: She exhibits no edema.       Left shoulder: She exhibits normal range of motion, no tenderness, no bony tenderness, no swelling, no effusion, no crepitus and no spasm.  No tenderness of L shoulder /upper arm  No tenderness of elbow area  No swelling Neg hawking test  Mildly limited abd and int rotation  Nl grip  No neuro changes   Lymphadenopathy:    She has no cervical adenopathy.  Neurological: She is alert.  Skin: Skin is warm and dry. No rash noted.  Psychiatric: She has a normal mood and affect.          Assessment & Plan:   Problem List Items Addressed This Visit      Respiratory   Acute sinusitis - Primary    Lars Masson /rhinorrhea with drip and mild sinus pressure over a week  Px zpak (pcn all)- she will hold for several days and fill it symptoms do not imp Disc symptomatic care - see instructions on AVS  Update if not starting to improve in a week or if worsening        Relevant Medications   azithromycin (ZITHROMAX) tablet     Other   Left arm pain    Intermittent -shoulder pain rad to deltoid area and occ burning sens with position Nl exam today  Seems to be resolved  Disc poss of tendonitis or pinched nerve  Will watch out for return of symptoms

## 2014-05-26 NOTE — Patient Instructions (Signed)
Try over the counter claritin for runny nose and post nasal drip  If no further improvement in several days (or if worse)- fill the px for zpak and take it  Drink lots of water and try to get some rest   I think your shoulder arthritis may be acting up / you may have had a pinched nerve in your arm  It symptoms return let me know

## 2014-05-27 NOTE — Assessment & Plan Note (Signed)
Barbara Kramer /rhinorrhea with drip and mild sinus pressure over a week  Px zpak (pcn all)- she will hold for several days and fill it symptoms do not imp Disc symptomatic care - see instructions on AVS  Update if not starting to improve in a week or if worsening

## 2014-05-27 NOTE — Assessment & Plan Note (Signed)
Intermittent -shoulder pain rad to deltoid area and occ burning sens with position Nl exam today  Seems to be resolved  Disc poss of tendonitis or pinched nerve  Will watch out for return of symptoms

## 2014-09-10 ENCOUNTER — Telehealth: Payer: Self-pay | Admitting: Family Medicine

## 2014-09-10 NOTE — Telephone Encounter (Signed)
In Dr. Marliss Coots inbox

## 2014-09-10 NOTE — Telephone Encounter (Signed)
Pt brought in a form for a handicap sticker. Best number to reach pt back at is [phone]. Placing on Shapale's desk.

## 2014-09-15 NOTE — Telephone Encounter (Signed)
Pt notified form ready for pick up 

## 2014-09-15 NOTE — Telephone Encounter (Signed)
Done and in IN box 

## 2014-10-14 ENCOUNTER — Encounter: Payer: Self-pay | Admitting: Family Medicine

## 2014-10-14 ENCOUNTER — Ambulatory Visit (INDEPENDENT_AMBULATORY_CARE_PROVIDER_SITE_OTHER): Payer: No Typology Code available for payment source | Admitting: Family Medicine

## 2014-10-14 VITALS — BP 130/62 | HR 75 | Temp 98.5°F | Ht 62.75 in | Wt 283.8 lb

## 2014-10-14 DIAGNOSIS — R1011 Right upper quadrant pain: Secondary | ICD-10-CM | POA: Insufficient documentation

## 2014-10-14 DIAGNOSIS — R109 Unspecified abdominal pain: Secondary | ICD-10-CM | POA: Diagnosis not present

## 2014-10-14 LAB — POCT URINALYSIS DIPSTICK
BILIRUBIN UA: NEGATIVE
Blood, UA: NEGATIVE
Glucose, UA: NEGATIVE
Ketones, UA: NEGATIVE
Leukocytes, UA: NEGATIVE
Nitrite, UA: NEGATIVE
PH UA: 5.5
Protein, UA: NEGATIVE
Urobilinogen, UA: 0.2

## 2014-10-14 NOTE — Progress Notes (Signed)
Subjective:    Patient ID: Barbara Kramer, female    DOB: 1950-06-23, 64 y.o.   MRN: [id number]  HPI Here with intermittent abdominal pain   Started 2 weeks ago  Sharp pain - R side of abd and flank area (tender)  Fleeting- just a few seconds at a time    This has happened several time   No triggers that she can recall  Has happened when working and when sitting still   No urinary symptoms at all   No heartburn No diarrhea or constipation Has a lot of gas   Does drink some carbonated drinks  Has eaten a lot of tomato  occ squash  Not a lot of beans  occ cheerios   Has had ccy in the past  CT of abd /pelvis 2014 - normal   No n/v or fever   ua nl  Results for orders placed or performed in visit on 10/14/14  POCT urinalysis dipstick  Result Value Ref Range   Color, UA Yellow    Clarity, UA Cloudy    Glucose, UA Neg.    Bilirubin, UA Neg.    Ketones, UA Neg.    Spec Grav, UA >=1.030    Blood, UA Neg.    pH, UA 5.5    Protein, UA Neg.    Urobilinogen, UA 0.2    Nitrite, UA Neg.    Leukocytes, UA Negative Negative     Patient Active Problem List   Diagnosis Date Noted  . RUQ abdominal pain 10/14/2014  . Acute sinusitis 05/26/2014  . Left arm pain 05/26/2014  . Osteoarthritis of left knee 10/16/2013  . Left knee pain 10/15/2013  . Colon cancer screening 11/20/2012  . Routine gynecological examination 11/17/2011  . Other screening mammogram 11/17/2011  . Low back pain 10/18/2010  . Routine general medical examination at a health care facility 10/06/2010  . 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 ALLERGIC RHINITIS 08/16/2007  . OBESITY 08/02/2006  . ECZEMA 08/02/2006   Past Medical History  Diagnosis Date  . Eczema   . Obesity   . Family hx of colon cancer   . Breast lump     left  . Kidney cysts     small, right 10/2002  . Normal cardiac stress test 9/10    exercise stress test normal but limited by exercise intolerance   Past Surgical History    Procedure Laterality Date  . Cholecystectomy  05/2002    [place]- gallstones, small renal cyst 05/2002  . Partial hysterectomy  1998    fibroids, ovaries remain  . Foot surgery  11/1998    bunion   History  Substance Use Topics  . Smoking status: Former Smoker    Quit date: 03/20/1976  . Smokeless tobacco: Not on file  . Alcohol Use: No   Family History  Problem Relation Age of Onset  . Colon cancer Mother   . Lung cancer Father     smoker  . Hypertension Sister   . Stroke Maternal Grandmother   . Stroke Paternal Grandmother   . Hypertension Sister   . Stomach cancer Mother    Allergies  Allergen Reactions  . Calcium Carbonate     REACTION: constipation  . Penicillins     REACTION: rash  . Calcium Carbonate Antacid Rash   Current Outpatient Prescriptions on File Prior to Visit  Medication Sig Dispense Refill  . [DISCONTINUED] famotidine (PEPCID) 20 MG tablet Take 1 tablet (20 mg total) by mouth  2 (two) times daily. 30 tablet 0   No current facility-administered medications on file prior to visit.    Review of Systems Review of Systems  Constitutional: Negative for fever, appetite change, fatigue and unexpected weight change.  Eyes: Negative for pain and visual disturbance.  Respiratory: Negative for cough and shortness of breath.   Cardiovascular: Negative for cp or palpitations    Gastrointestinal: Negative for nausea, diarrhea and constipation. neg for dark stool/ blood in stool  Genitourinary: Negative for urgency and frequency.  Skin: Negative for pallor or rash   Neurological: Negative for weakness, light-headedness, numbness and headaches.  Hematological: Negative for adenopathy. Does not bruise/bleed easily.  Psychiatric/Behavioral: Negative for dysphoric mood. The patient is not nervous/anxious.         Objective:   Physical Exam  Constitutional: She appears well-developed and well-nourished. No distress.  obese and well appearing   HENT:  Head:  Normocephalic and atraumatic.  Mouth/Throat: Oropharynx is clear and moist.  Eyes: Conjunctivae and EOM are normal. Pupils are equal, round, and reactive to light. No scleral icterus.  Neck: Normal range of motion. Neck supple.  Cardiovascular: Normal rate and regular rhythm.   Pulmonary/Chest: Effort normal and breath sounds normal. No respiratory distress. She has no wheezes. She has no rales.  Abdominal: Soft. Bowel sounds are normal. She exhibits no distension and no mass. There is no tenderness. There is no rebound and no guarding.  Musculoskeletal: She exhibits no edema.  Lymphadenopathy:    She has no cervical adenopathy.  Neurological: She is alert. She has normal reflexes.  Skin: Skin is warm and dry. No pallor.  Psychiatric: She has a normal mood and affect.          Assessment & Plan:   Problem List Items Addressed This Visit    RUQ abdominal pain    Fleeting - times 2 (one working and one sitting) Poss exac by twisting I suspect gas or muscle spasm Rev diet -will keep a journal of what makes her more gassy/ also avoid carbonated bev/ gum etc If worse or persistent will plan further eval       Other Visit Diagnoses    Flank pain    -  Primary    Relevant Orders    POCT urinalysis dipstick (Completed)

## 2014-10-14 NOTE — Assessment & Plan Note (Signed)
Fleeting - times 2 (one working and one sitting) Poss exac by twisting I suspect gas or muscle spasm Rev diet -will keep a journal of what makes her more gassy/ also avoid carbonated bev/ gum etc If worse or persistent will plan further eval

## 2014-10-14 NOTE — Progress Notes (Signed)
Pre visit review using our clinic review tool, if applicable. No additional management support is needed unless otherwise documented below in the visit note. 

## 2014-10-14 NOTE — Patient Instructions (Signed)
Keep track of what you eat and what bothers you  Avoid carbonated beverages Avoid spicy things if they bother you  If the pains become more frequent or worse or you develop other symptoms please let me know (including urinary symptoms)  I think your pain is most likely for gas or a muscle spasm  Make sure you drink enough water   We will evaluate the problem further if it does not improve

## 2014-12-06 ENCOUNTER — Telehealth: Payer: Self-pay | Admitting: Family Medicine

## 2014-12-06 DIAGNOSIS — Z0000 Encounter for general adult medical examination without abnormal findings: Secondary | ICD-10-CM

## 2014-12-06 NOTE — Telephone Encounter (Signed)
-----   Message from Ellamae Sia sent at 12/03/2014 10:48 AM EDT ----- Regarding: Lab orders for Tuesday, 9.20.16 Patient is scheduled for CPX labs, please order future labs, Thanks , Karna Christmas

## 2014-12-08 ENCOUNTER — Other Ambulatory Visit (INDEPENDENT_AMBULATORY_CARE_PROVIDER_SITE_OTHER): Payer: No Typology Code available for payment source

## 2014-12-08 DIAGNOSIS — Z0000 Encounter for general adult medical examination without abnormal findings: Secondary | ICD-10-CM | POA: Diagnosis not present

## 2014-12-08 LAB — COMPREHENSIVE METABOLIC PANEL
ALBUMIN: 4 g/dL (ref 3.5–5.2)
ALK PHOS: 68 U/L (ref 39–117)
ALT: 12 U/L (ref 0–35)
AST: 14 U/L (ref 0–37)
BUN: 14 mg/dL (ref 6–23)
CHLORIDE: 104 meq/L (ref 96–112)
CO2: 27 mEq/L (ref 19–32)
Calcium: 9.6 mg/dL (ref 8.4–10.5)
Creatinine, Ser: 0.95 mg/dL (ref 0.40–1.20)
GFR: 76.67 mL/min (ref >60.00)
Glucose, Bld: 108 mg/dL — ABNORMAL HIGH (ref 70–99)
POTASSIUM: 4 meq/L (ref 3.5–5.1)
SODIUM: 139 meq/L (ref 135–145)
TOTAL PROTEIN: 7.6 g/dL (ref 6.0–8.3)
Total Bilirubin: 0.4 mg/dL (ref 0.2–1.2)

## 2014-12-08 LAB — CBC WITH DIFFERENTIAL/PLATELET
BASOS PCT: 0.4 % (ref 0.0–3.0)
Basophils Absolute: 0 10*3/uL (ref 0.0–0.1)
EOS PCT: 2.2 % (ref 0.0–5.0)
Eosinophils Absolute: 0.2 10*3/uL (ref 0.0–0.7)
HEMATOCRIT: 39.3 % (ref 36.0–46.0)
HEMOGLOBIN: 12.8 g/dL (ref 12.0–15.0)
LYMPHS PCT: 26.9 % (ref 12.0–46.0)
Lymphs Abs: 2.5 10*3/uL (ref 0.7–4.0)
MCHC: 32.5 g/dL (ref 30.0–36.0)
MCV: 85.5 fl (ref 78.0–100.0)
Monocytes Absolute: 0.4 10*3/uL (ref 0.1–1.0)
Monocytes Relative: 4.5 % (ref 3.0–12.0)
Neutro Abs: 6.2 10*3/uL (ref 1.4–7.7)
Neutrophils Relative %: 66 % (ref 43.0–77.0)
Platelets: 293 10*3/uL (ref 150.0–400.0)
RBC: 4.59 Mil/uL (ref 3.87–5.11)
RDW: 15.4 % (ref 11.5–15.5)
WBC: 9.3 10*3/uL (ref 4.0–10.5)

## 2014-12-08 LAB — LIPID PANEL
CHOLESTEROL: 142 mg/dL (ref 0–200)
HDL: 49.9 mg/dL (ref >39.00)
LDL Cholesterol: 75 mg/dL (ref 0–99)
NonHDL: 92.19
TRIGLYCERIDES: 86 mg/dL (ref 0.0–149.0)
Total CHOL/HDL Ratio: 3
VLDL: 17.2 mg/dL (ref 0.0–40.0)

## 2014-12-08 LAB — TSH: TSH: 1.45 u[IU]/mL (ref 0.35–4.50)

## 2014-12-15 ENCOUNTER — Ambulatory Visit (INDEPENDENT_AMBULATORY_CARE_PROVIDER_SITE_OTHER): Payer: No Typology Code available for payment source | Admitting: Family Medicine

## 2014-12-15 ENCOUNTER — Encounter: Payer: Self-pay | Admitting: Family Medicine

## 2014-12-15 VITALS — BP 126/74 | HR 81 | Temp 97.8°F | Ht 62.0 in | Wt 293.5 lb

## 2014-12-15 DIAGNOSIS — Z1211 Encounter for screening for malignant neoplasm of colon: Secondary | ICD-10-CM

## 2014-12-15 DIAGNOSIS — Z131 Encounter for screening for diabetes mellitus: Secondary | ICD-10-CM | POA: Insufficient documentation

## 2014-12-15 DIAGNOSIS — E669 Obesity, unspecified: Secondary | ICD-10-CM

## 2014-12-15 DIAGNOSIS — Z0000 Encounter for general adult medical examination without abnormal findings: Secondary | ICD-10-CM

## 2014-12-15 DIAGNOSIS — R739 Hyperglycemia, unspecified: Secondary | ICD-10-CM | POA: Diagnosis not present

## 2014-12-15 DIAGNOSIS — R7303 Prediabetes: Secondary | ICD-10-CM | POA: Insufficient documentation

## 2014-12-15 LAB — HEMOGLOBIN A1C: Hgb A1c MFr Bld: 6 % (ref 4.6–6.5)

## 2014-12-15 NOTE — Progress Notes (Signed)
Pre visit review using our clinic review tool, if applicable. No additional management support is needed unless otherwise documented below in the visit note. 

## 2014-12-15 NOTE — Progress Notes (Signed)
Subjective:    Patient ID: Barbara Kramer, female    DOB: Nov 16, 1950, 64 y.o.   MRN: [id number]  HPI Here for health maintenance exam and to review chronic medical problems  Is planning to retire in 2 weeks  She is taking a class so she can sub and also volunteer for [employer]  Excited      Wt is up 10 lb with bmi of 53 Number one goal is wt loss after retirement  Her job causes her to sit all day long and also not eat right  Will have time to exercise and prep meals  She has not investigated a program yet   Hep C/HIV screening - she declines , had recent HIV screening with a program in her community this summer  She donated blood in aug - so no hep C   Zoster vaccine-not interested   Mammogram 6/15 nl , has one scheduled for next Friday  Self exam - no lumps  Does get skin scabs occ that heal   Has had a partial hysterectomy No gyn symptoms   Declines flu vaccine   Td 5/08  colonosc 11/08 with 5 y recall (fam hx of colon cancer) - had not been able to afford a colonoscopy Deductible is so high she cannot pay it  Plan on using the [place] health care act -   ? If they will cover   Glucose is mildly high at 108 She eats a lot of potatoes - especially fried and a lot of bread    Results for orders placed or performed in visit on 12/08/14  CBC with Differential/Platelet  Result Value Ref Range   WBC 9.3 4.0 - 10.5 K/uL   RBC 4.59 3.87 - 5.11 Mil/uL   Hemoglobin 12.8 12.0 - 15.0 g/dL   HCT 39.3 36.0 - 46.0 %   MCV 85.5 78.0 - 100.0 fl   MCHC 32.5 30.0 - 36.0 g/dL   RDW 15.4 11.5 - 15.5 %   Platelets 293.0 150.0 - 400.0 K/uL   Neutrophils Relative % 66.0 43.0 - 77.0 %   Lymphocytes Relative 26.9 12.0 - 46.0 %   Monocytes Relative 4.5 3.0 - 12.0 %   Eosinophils Relative 2.2 0.0 - 5.0 %   Basophils Relative 0.4 0.0 - 3.0 %   Neutro Abs 6.2 1.4 - 7.7 K/uL   Lymphs Abs 2.5 0.7 - 4.0 K/uL   Monocytes Absolute 0.4 0.1 - 1.0 K/uL   Eosinophils Absolute 0.2 0.0 -  0.7 K/uL   Basophils Absolute 0.0 0.0 - 0.1 K/uL  Comprehensive metabolic panel  Result Value Ref Range   Sodium 139 135 - 145 mEq/L   Potassium 4.0 3.5 - 5.1 mEq/L   Chloride 104 96 - 112 mEq/L   CO2 27 19 - 32 mEq/L   Glucose, Bld 108 (H) 70 - 99 mg/dL   BUN 14 6 - 23 mg/dL   Creatinine, Ser 0.95 0.40 - 1.20 mg/dL   Total Bilirubin 0.4 0.2 - 1.2 mg/dL   Alkaline Phosphatase 68 39 - 117 U/L   AST 14 0 - 37 U/L   ALT 12 0 - 35 U/L   Total Protein 7.6 6.0 - 8.3 g/dL   Albumin 4.0 3.5 - 5.2 g/dL   Calcium 9.6 8.4 - 10.5 mg/dL   GFR 76.67 >60.00 mL/min  Lipid panel  Result Value Ref Range   Cholesterol 142 0 - 200 mg/dL   Triglycerides 86.0 0.0 - 149.0 mg/dL  HDL 49.90 >39.00 mg/dL   VLDL 17.2 0.0 - 40.0 mg/dL   LDL Cholesterol 75 0 - 99 mg/dL   Total CHOL/HDL Ratio 3    NonHDL 92.19   TSH  Result Value Ref Range   TSH 1.45 0.35 - 4.50 uIU/mL     Patient Active Problem List   Diagnosis Date Noted  . Hyperglycemia 12/15/2014  . RUQ abdominal pain 10/14/2014  . Acute sinusitis 05/26/2014  . Left arm pain 05/26/2014  . Osteoarthritis of left knee 10/16/2013  . Left knee pain 10/15/2013  . Colon cancer screening 11/20/2012  . Routine gynecological examination 11/17/2011  . Other screening mammogram 11/17/2011  . Low back pain 10/18/2010  . Routine general medical examination at a health care facility 10/06/2010  . 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 ALLERGIC RHINITIS 08/16/2007  . Obesity 08/02/2006  . ECZEMA 08/02/2006   Past Medical History  Diagnosis Date  . Eczema   . Obesity   . Family hx of colon cancer   . Breast lump     left  . Kidney cysts     small, right 10/2002  . Normal cardiac stress test 9/10    exercise stress test normal but limited by exercise intolerance   Past Surgical History  Procedure Laterality Date  . Cholecystectomy  05/2002    [place]- gallstones, small renal cyst 05/2002  . Partial hysterectomy  1998    fibroids, ovaries remain  .  Foot surgery  11/1998    bunion   Social History  Substance Use Topics  . Smoking status: Former Smoker    Quit date: 03/20/1976  . Smokeless tobacco: None  . Alcohol Use: No   Family History  Problem Relation Age of Onset  . Colon cancer Mother   . Lung cancer Father     smoker  . Hypertension Sister   . Stroke Maternal Grandmother   . Stroke Paternal Grandmother   . Hypertension Sister   . Stomach cancer Mother    Allergies  Allergen Reactions  . Calcium Carbonate     REACTION: constipation  . Penicillins     REACTION: rash  . Calcium Carbonate Antacid Rash   Current Outpatient Prescriptions on File Prior to Visit  Medication Sig Dispense Refill  . [DISCONTINUED] famotidine (PEPCID) 20 MG tablet Take 1 tablet (20 mg total) by mouth 2 (two) times daily. 30 tablet 0   No current facility-administered medications on file prior to visit.    Review of Systems Review of Systems  Constitutional: Negative for fever, appetite change, fatigue and unexpected weight change.  Eyes: Negative for pain and visual disturbance.  Respiratory: Negative for cough and shortness of breath.   Cardiovascular: Negative for cp or palpitations    Gastrointestinal: Negative for nausea, diarrhea and constipation.  Genitourinary: Negative for urgency and frequency. neg for excessive thirst MSK pos for aches and pains occ  Skin: Negative for pallor or rash   Neurological: Negative for weakness, light-headedness, numbness and headaches.  Hematological: Negative for adenopathy. Does not bruise/bleed easily.  Psychiatric/Behavioral: Negative for dysphoric mood. The patient is not nervous/anxious.         Objective:   Physical Exam  Constitutional: She appears well-developed and well-nourished. No distress.  Morbidly obese and well appearing   HENT:  Head: Normocephalic and atraumatic.  Right Ear: External ear normal.  Left Ear: External ear normal.  Mouth/Throat: Oropharynx is clear and  moist.  Eyes: Conjunctivae and EOM are normal. Pupils are equal, round,  and reactive to light. No scleral icterus.  Neck: Normal range of motion. Neck supple. No JVD present. Carotid bruit is not present. No thyromegaly present.  Cardiovascular: Normal rate, regular rhythm, normal heart sounds and intact distal pulses.  Exam reveals no gallop.   Pulmonary/Chest: Effort normal and breath sounds normal. No respiratory distress. She has no wheezes. She exhibits no tenderness.  Abdominal: Soft. Bowel sounds are normal. She exhibits no distension, no abdominal bruit and no mass. There is no tenderness.  Genitourinary: No breast swelling, tenderness, discharge or bleeding.  Breast exam: No mass, nodules, thickening, tenderness, bulging, retraction, inflamation, nipple discharge or skin changes noted.  No axillary or clavicular LA.     No rash or scabs on breasts today  Musculoskeletal: Normal range of motion. She exhibits no edema or tenderness.  Lymphadenopathy:    She has no cervical adenopathy.  Neurological: She is alert. She has normal reflexes. No cranial nerve deficit. She exhibits normal muscle tone. Coordination normal.  Skin: Skin is warm and dry. No rash noted. No erythema. No pallor.  Psychiatric: She has a normal mood and affect.          Assessment & Plan:   Problem List Items Addressed This Visit      Other   Colon cancer screening    Cannot afford colonoscopy - high deductible (and loosing ins soon) Given ifob kit       Relevant Orders   Fecal occult blood, imunochemical   Hyperglycemia    Elevated fasting glucose A1C added  Disc risk of DM with obesity  Has a plan to work on weight loss and low glycemic diet (rev in detail)  Will continue to follow       Relevant Orders   Hemoglobin A1c (Completed)   Obesity    Discussed how this problem influences overall health and the risks it imposes (including diabetes) Reviewed plan for weight loss with lower calorie  diet (via better food choices and also portion control or program like weight watchers) and exercise building up to or more than 30 minutes 5 days per week including some aerobic activity   Disc a plan - retirement will allow her more time to work on it        Routine general medical examination at a health care facility - Primary    Reviewed health habits including diet and exercise and skin cancer prevention Reviewed appropriate screening tests for age  Also reviewed health mt list, fam hx and immunization status , as well as social and family history   See HPI Labs reviewed  Will add A1C for hyperglycemia Enc wt loss strongly along with low glycemic diet to reduce DM risk Cannot afford a colonoscopy-given ifob card Get your mammogram as planned  Please do stool kit for screening  She declines flu shot

## 2014-12-15 NOTE — Patient Instructions (Signed)
Lab today for blood sugar to see if you are borderline for diabetes  Work on weight loss- work up to exercise 5 days per week for at least 30 minutes  Cut calorie intake and portions For blood sugar - cut carbs (bread/pasta/potato/fruit/sweets- anything starchy) Avoid sugar drinks  Get your mammogram as planned  Please do stool kit for screening

## 2014-12-16 ENCOUNTER — Telehealth: Payer: Self-pay

## 2014-12-16 NOTE — Assessment & Plan Note (Signed)
Discussed how this problem influences overall health and the risks it imposes (including diabetes) Reviewed plan for weight loss with lower calorie diet (via better food choices and also portion control or program like weight watchers) and exercise building up to or more than 30 minutes 5 days per week including some aerobic activity   Disc a plan - retirement will allow her more time to work on it

## 2014-12-16 NOTE — Assessment & Plan Note (Signed)
Reviewed health habits including diet and exercise and skin cancer prevention Reviewed appropriate screening tests for age  Also reviewed health mt list, fam hx and immunization status , as well as social and family history   See HPI Labs reviewed  Will add A1C for hyperglycemia Enc wt loss strongly along with low glycemic diet to reduce DM risk Cannot afford a colonoscopy-given ifob card Get your mammogram as planned  Please do stool kit for screening  She declines flu shot

## 2014-12-16 NOTE — Assessment & Plan Note (Signed)
Elevated fasting glucose A1C added  Disc risk of DM with obesity  Has a plan to work on weight loss and low glycemic diet (rev in detail)  Will continue to follow

## 2014-12-16 NOTE — Telephone Encounter (Signed)
Pt request 12/15/14 lab results; pt advised per mychart note and pt voiced understanding.

## 2014-12-16 NOTE — Assessment & Plan Note (Signed)
Cannot afford colonoscopy - high deductible (and loosing ins soon) Given ifob kit

## 2014-12-28 ENCOUNTER — Encounter: Payer: Self-pay | Admitting: Family Medicine

## 2015-07-15 ENCOUNTER — Other Ambulatory Visit: Payer: Self-pay | Admitting: Internal Medicine

## 2015-07-15 ENCOUNTER — Ambulatory Visit
Admission: RE | Admit: 2015-07-15 | Discharge: 2015-07-15 | Disposition: A | Discharge status: Home / Self Care | Payer: BLUE CROSS/BLUE SHIELD | Source: Ambulatory Visit | Attending: Internal Medicine | Admitting: Internal Medicine

## 2015-07-15 DIAGNOSIS — M79605 Pain in left leg: Secondary | ICD-10-CM | POA: Diagnosis present

## 2015-07-15 DIAGNOSIS — M79662 Pain in left lower leg: Secondary | ICD-10-CM

## 2015-08-04 ENCOUNTER — Other Ambulatory Visit: Payer: Self-pay | Admitting: Orthopedic Surgery

## 2015-08-04 DIAGNOSIS — M2392 Unspecified internal derangement of left knee: Secondary | ICD-10-CM

## 2015-08-04 DIAGNOSIS — M1712 Unilateral primary osteoarthritis, left knee: Secondary | ICD-10-CM

## 2015-10-26 ENCOUNTER — Encounter: Payer: Self-pay | Admitting: Primary Care

## 2015-10-26 ENCOUNTER — Ambulatory Visit (INDEPENDENT_AMBULATORY_CARE_PROVIDER_SITE_OTHER): Payer: BLUE CROSS/BLUE SHIELD | Admitting: Primary Care

## 2015-10-26 VITALS — BP 134/74 | HR 81 | Temp 98.1°F | Ht 62.75 in | Wt 298.4 lb

## 2015-10-26 DIAGNOSIS — H18892 Other specified disorders of cornea, left eye: Secondary | ICD-10-CM

## 2015-10-26 MED ORDER — POLYMYXIN B-TRIMETHOPRIM 10000-0.1 UNIT/ML-% OP SOLN: 1.0000 [drp] | Freq: Four times a day (QID) | OPHTHALMIC | 0 refills | Status: AC

## 2015-10-26 NOTE — Patient Instructions (Signed)
Start eye drops for irritation and to prevent infection. Instill 1 drop into left eye four times daily for 7 days.  Please notify me if no improvement or if you notice increased pressure and/or visual changes.  It was a pleasure meeting you!

## 2015-10-26 NOTE — Progress Notes (Signed)
Pre visit review using our clinic review tool, if applicable. No additional management support is needed unless otherwise documented below in the visit note. 

## 2015-10-26 NOTE — Progress Notes (Signed)
Subjective:    Patient ID: Barbara Kramer, female    DOB: 07-Jul-1950, 64 y.o.   MRN: [id number]  HPI  Barbara Kramer is a 65 year old female who presents today with a chief complaint of eye discomfort. She describes her discomfort as a sore pressure which is located to the left eye under her upper eye lid. She first noticed these symptoms upon waking this morning. She feels as though there is a sore place/cut to her upper eye behind her eye lid. She's also noticed erythema later today after she's been rubbing her eye. Denies drainage, fevers, changes in vision, sneezing. She was mowing the grass Saturday afternoon.   Review of Systems  Constitutional: Negative for fever.  HENT: Negative for congestion and ear pain.   Eyes: Positive for photophobia, pain and redness. Negative for discharge, itching and visual disturbance.  Respiratory: Negative for cough.        Past Medical History:  Diagnosis Date  . Breast lump    left  . Eczema   . Family hx of colon cancer   . Kidney cysts    small, right 10/2002  . Normal cardiac stress test 9/10   exercise stress test normal but limited by exercise intolerance  . Obesity      Social History   Social History  . Marital status: Single    Spouse name: N/A  . Number of children: 2  . Years of education: N/A   Occupational History  .  [place] History Main Topics  . Smoking status: Former Smoker    Quit date: 03/20/1976  . Smokeless tobacco: Not on file  . Alcohol use No  . Drug use: No  . Sexual activity: Not on file   Other Topics Concern  . Not on file   Social History Narrative  . No narrative on file    Past Surgical History:  Procedure Laterality Date  . CHOLECYSTECTOMY  05/2002   [place]- gallstones, small renal cyst 05/2002  . FOOT SURGERY  11/1998   bunion  . PARTIAL HYSTERECTOMY  1998   fibroids, ovaries remain    Family History  Problem Relation Age of Onset  . Colon cancer Mother   . Lung  cancer Father     smoker  . Hypertension Sister   . Stroke Maternal Grandmother   . Stroke Paternal Grandmother   . Hypertension Sister   . Stomach cancer Mother     Allergies  Allergen Reactions  . Calcium Carbonate     REACTION: constipation  . Penicillins     REACTION: rash  . Calcium Carbonate Antacid Rash    Current Outpatient Prescriptions on File Prior to Visit  Medication Sig Dispense Refill  . [DISCONTINUED] famotidine (PEPCID) 20 MG tablet Take 1 tablet (20 mg total) by mouth 2 (two) times daily. 30 tablet 0   No current facility-administered medications on file prior to visit.     BP 134/74   Pulse 81   Temp 98.1 F (36.7 C) (Oral)   Ht 5' 2.75" (1.594 m)   Wt 298 lb 6.4 oz (135.4 kg)   SpO2 98%   BMI 53.28 kg/m    Objective:   Physical Exam  Constitutional: She appears well-nourished.  HENT:  Right Ear: Tympanic membrane and ear canal normal.  Left Ear: Tympanic membrane and ear canal normal.  Nose: Right sinus exhibits no maxillary sinus tenderness and no frontal sinus tenderness. Left sinus exhibits no maxillary  sinus tenderness and no frontal sinus tenderness.  Mouth/Throat: Oropharynx is clear and moist.  Eyes: Pupils are equal, round, and reactive to light. Left eye exhibits no discharge. No foreign body present in the left eye. Right conjunctiva is not injected. Left conjunctiva is injected. Left conjunctiva has no hemorrhage. Left eye exhibits normal extraocular motion and no nystagmus.  Neck: Neck supple.  Cardiovascular: Normal rate and regular rhythm.   Pulmonary/Chest: Effort normal and breath sounds normal. She has no wheezes. She has no rales.  Lymphadenopathy:    She has no cervical adenopathy.  Skin: Skin is warm and dry.          Assessment & Plan:  Eye Discomfort:  Located under upper lid of left eye since waking this AM. Was mowing her yard Saturday afternoon. No drainage, fevers, changes in vision. Exam today with moderate  injection to left conjunctiva without drainage. No obvious foreign body noted.  Suspect possible corneal abrasion given history of yard work and symptoms. Will cover with antibiotic drops. Polymixin trimethoprim B sent to pharmacy for 5-7 day course. Follow up PRN.  Sheral Flow, NP

## 2015-11-26 ENCOUNTER — Ambulatory Visit: Payer: BLUE CROSS/BLUE SHIELD | Admitting: Family Medicine

## 2015-11-26 ENCOUNTER — Telehealth: Payer: Self-pay

## 2015-11-26 NOTE — Telephone Encounter (Signed)
PLEASE NOTE: All timestamps contained within this report are represented as [place] Standard Time. CONFIDENTIALTY NOTICE: This fax transmission is intended only for the addressee. It contains information that is legally privileged, confidential or otherwise protected from use or disclosure. If you are not the intended recipient, you are strictly prohibited from reviewing, disclosing, copying using or disseminating any of this information or taking any action in reliance on or regarding this information. If you have received this fax in error, please notify us immediately by telephone so that we can arrange for its return to [place]. Phone: [phone], Toll-Free: [phone], Fax: [phone] Page: 1 of 1 Call Id: [id number] [place] [facility] - Client Nonclinical Telephone Record [place] [facility] - Client Client Site [place] MD Contact Type Call Who Is Calling Patient / Member / Family / Caregiver Caller Name Rabiah [place] Caller Phone Number [phone] Patient Name Barbara Kramer Call Type Message Only Information Provided Reason for Call Request to Reschedule Office Appointment Initial Comment Caller says, hand sanitizer squirted in her eye, it is not hurting now. Her blinking just feels strange. She has an appt today, but she thinks she will wait until tomorrow. She says if she still feels bad she will go to a UC. Call Closed By: Eather Colas Transaction Date/Time: 11/26/2015 12:37:00 PM (ET)

## 2016-02-15 ENCOUNTER — Other Ambulatory Visit: Payer: Self-pay | Admitting: Family Medicine

## 2016-02-15 DIAGNOSIS — Z1231 Encounter for screening mammogram for malignant neoplasm of breast: Secondary | ICD-10-CM

## 2016-09-15 ENCOUNTER — Other Ambulatory Visit: Payer: BLUE CROSS/BLUE SHIELD

## 2016-09-22 ENCOUNTER — Encounter: Payer: BLUE CROSS/BLUE SHIELD | Admitting: Family Medicine

## 2016-10-01 ENCOUNTER — Telehealth: Payer: Self-pay | Admitting: Family Medicine

## 2016-10-01 DIAGNOSIS — R739 Hyperglycemia, unspecified: Secondary | ICD-10-CM

## 2016-10-01 DIAGNOSIS — Z0000 Encounter for general adult medical examination without abnormal findings: Secondary | ICD-10-CM

## 2016-10-01 NOTE — Telephone Encounter (Signed)
-----   Message from Marchia Bond sent at 09/28/2016 11:13 AM EDT ----- Regarding: Cpx labs Thurs 7/19, need orders. Thanks! :-) Please order  future cpx labs for pt's upcoming lab appt. Thanks Aniceto Boss

## 2016-10-04 ENCOUNTER — Other Ambulatory Visit: Payer: BLUE CROSS/BLUE SHIELD

## 2016-10-05 ENCOUNTER — Other Ambulatory Visit (INDEPENDENT_AMBULATORY_CARE_PROVIDER_SITE_OTHER): Payer: BLUE CROSS/BLUE SHIELD

## 2016-10-05 DIAGNOSIS — R739 Hyperglycemia, unspecified: Secondary | ICD-10-CM

## 2016-10-05 DIAGNOSIS — Z0000 Encounter for general adult medical examination without abnormal findings: Secondary | ICD-10-CM | POA: Diagnosis not present

## 2016-10-05 LAB — CBC WITH DIFFERENTIAL/PLATELET
Basophils Absolute: 0 10*3/uL (ref 0.0–0.1)
Basophils Relative: 0.5 % (ref 0.0–3.0)
EOS PCT: 2 % (ref 0.0–5.0)
Eosinophils Absolute: 0.2 10*3/uL (ref 0.0–0.7)
HCT: 39.8 % (ref 36.0–46.0)
Hemoglobin: 12.8 g/dL (ref 12.0–15.0)
Lymphocytes Relative: 33.8 % (ref 12.0–46.0)
Lymphs Abs: 2.8 10*3/uL (ref 0.7–4.0)
MCHC: 32.1 g/dL (ref 30.0–36.0)
MCV: 87.3 fl (ref 78.0–100.0)
MONO ABS: 0.4 10*3/uL (ref 0.1–1.0)
Monocytes Relative: 5.1 % (ref 3.0–12.0)
Neutro Abs: 4.9 10*3/uL (ref 1.4–7.7)
Neutrophils Relative %: 58.6 % (ref 43.0–77.0)
Platelets: 268 10*3/uL (ref 150.0–400.0)
RBC: 4.56 Mil/uL (ref 3.87–5.11)
RDW: 15.1 % (ref 11.5–15.5)
WBC: 8.4 10*3/uL (ref 4.0–10.5)

## 2016-10-05 LAB — COMPREHENSIVE METABOLIC PANEL
ALT: 12 U/L (ref 0–35)
AST: 14 U/L (ref 0–37)
Albumin: 4.1 g/dL (ref 3.5–5.2)
Alkaline Phosphatase: 63 U/L (ref 39–117)
BUN: 15 mg/dL (ref 6–23)
CALCIUM: 9.9 mg/dL (ref 8.4–10.5)
CHLORIDE: 102 meq/L (ref 96–112)
CO2: 27 mEq/L (ref 19–32)
Creatinine, Ser: 1.04 mg/dL (ref 0.40–1.20)
GFR: 68.66 mL/min (ref >60.00)
Glucose, Bld: 100 mg/dL — ABNORMAL HIGH (ref 70–99)
Potassium: 4 mEq/L (ref 3.5–5.1)
Sodium: 136 mEq/L (ref 135–145)
Total Bilirubin: 0.4 mg/dL (ref 0.2–1.2)
Total Protein: 7.2 g/dL (ref 6.0–8.3)

## 2016-10-05 LAB — LIPID PANEL
CHOLESTEROL: 143 mg/dL (ref 0–200)
HDL: 51.7 mg/dL (ref >39.00)
LDL CALC: 80 mg/dL (ref 0–99)
NonHDL: 91.51
TRIGLYCERIDES: 60 mg/dL (ref 0.0–149.0)
Total CHOL/HDL Ratio: 3
VLDL: 12 mg/dL (ref 0.0–40.0)

## 2016-10-05 LAB — HEMOGLOBIN A1C: Hgb A1c MFr Bld: 6.2 % (ref 4.6–6.5)

## 2016-10-05 LAB — TSH: TSH: 3.38 u[IU]/mL (ref 0.35–4.50)

## 2016-10-11 ENCOUNTER — Encounter: Payer: Self-pay | Admitting: Family Medicine

## 2016-10-11 ENCOUNTER — Ambulatory Visit (INDEPENDENT_AMBULATORY_CARE_PROVIDER_SITE_OTHER): Payer: BLUE CROSS/BLUE SHIELD | Admitting: Family Medicine

## 2016-10-11 VITALS — BP 118/74 | HR 78 | Temp 98.4°F | Ht 62.0 in | Wt 293.5 lb

## 2016-10-11 DIAGNOSIS — Z1231 Encounter for screening mammogram for malignant neoplasm of breast: Secondary | ICD-10-CM

## 2016-10-11 DIAGNOSIS — R739 Hyperglycemia, unspecified: Secondary | ICD-10-CM

## 2016-10-11 DIAGNOSIS — Z1211 Encounter for screening for malignant neoplasm of colon: Secondary | ICD-10-CM

## 2016-10-11 DIAGNOSIS — R0683 Snoring: Secondary | ICD-10-CM | POA: Diagnosis not present

## 2016-10-11 DIAGNOSIS — Z0000 Encounter for general adult medical examination without abnormal findings: Secondary | ICD-10-CM | POA: Diagnosis not present

## 2016-10-11 NOTE — Progress Notes (Signed)
Subjective:    Patient ID: Barbara Kramer, female    DOB: 10/26/1950, 66 y.o.   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     [employer] from Last 3 Encounters:  10/11/16 293 lb 8 oz (133.1 kg)  10/26/15 298 lb 6.4 oz (135.4 kg)  12/15/14 293 lb 8 oz (133.1 kg)  down 5 lb  Trying to work on it  Eating - good choices/diet most of the time  Exercise -not good , she would love to walk but her legs get weak feeling  Wants to start water aerobics occ her knees feel like they are going to go out on her (OA diag in the past)  53.68 kg/m  Mammogram 10/16 normal (called to schedule - but cannot go to [facility] because is is not covered)  Self exam -no lumps   Worried about poss sleep apnea  Not feeling rested after sleeping/not sleeping well  Day time somnolence and brain fuzziness  Snores loudly ! - family recorded  Nods off easily   Colonoscopy 11/08 with 5y recall  ifob 6/11 neg  Deductible is very high so she cannot afford a colonoscopy   Tetanus shot - had one 2/17 at [facility]   Hyperglycemia Lab Results  Component Value Date   HGBA1C 6.2 10/05/2016  was 6.0 previous  Eats bread and white potatoes  Not a lot of sweets    BP Readings from Last 3 Encounters:  10/11/16 118/74  10/26/15 134/74  12/15/14 126/74   Pulse Readings from Last 3 Encounters:  10/11/16 78  10/26/15 81  12/15/14 81    Cholesterol Lab Results  Component Value Date   CHOL 143 10/05/2016   CHOL 142 12/08/2014   CHOL 135 11/13/2012   Lab Results  Component Value Date   HDL 51.70 10/05/2016   HDL 49.90 12/08/2014   HDL 45.50 11/13/2012   Lab Results  Component Value Date   LDLCALC 80 10/05/2016   LDLCALC 75 12/08/2014   LDLCALC 79 11/13/2012   Lab Results  Component Value Date   TRIG 60.0 10/05/2016   TRIG 86.0  12/08/2014   TRIG 52.0 11/13/2012   Lab Results  Component Value Date   CHOLHDL 3 10/05/2016   CHOLHDL 3 12/08/2014   CHOLHDL 3 11/13/2012   No results found for: LDLDIRECT Good profile  Diet is fair  Lab Results  Component Value Date   WBC 8.4 10/05/2016   HGB 12.8 10/05/2016   HCT 39.8 10/05/2016   MCV 87.3 10/05/2016   PLT 268.0 10/05/2016   Lab Results  Component Value Date   TSH 3.38 10/05/2016      Chemistry      Component Value Date/Time   NA 136 10/05/2016 0906   K 4.0 10/05/2016 0906   CL 102 10/05/2016 0906   CO2 27 10/05/2016 0906   BUN 15 10/05/2016 0906   CREATININE 1.04 10/05/2016 0906      Component Value Date/Time   CALCIUM 9.9 10/05/2016 0906   ALKPHOS 63 10/05/2016 0906   AST 14 10/05/2016 0906   ALT 12 10/05/2016 0906   BILITOT 0.4 10/05/2016 0906      Review of Systems Review of Systems  Constitutional: Negative for fever, appetite change,  and unexpected weight change. pos for fatigue and unrestful sleep  with snoring Eyes: Negative for pain and visual disturbance.  Respiratory: Negative for cough and shortness of breath.   Cardiovascular: Negative for cp or palpitations    Gastrointestinal: Negative for nausea, diarrhea and constipation.  Genitourinary: Negative for urgency and frequency.  Skin: Negative for pallor or rash  pos for occ whelps on arms  MSK pos for aches and pains  Neurological: Negative for weakness, light-headedness, numbness and headaches.  Hematological: Negative for adenopathy. Does not bruise/bleed easily.  Psychiatric/Behavioral: Negative for dysphoric mood. The patient is not nervous/anxious.         Objective:   Physical Exam  Constitutional: She appears well-developed and well-nourished. No distress.  obese and well appearing   HENT:  Head: Normocephalic and atraumatic.  Right Ear: External ear normal.  Left Ear: External ear normal.  Mouth/Throat: Oropharynx is clear and moist.  Eyes: Pupils are equal,  round, and reactive to light. Conjunctivae and EOM are normal. No scleral icterus.  Neck: Normal range of motion. Neck supple. No JVD present. Carotid bruit is not present. No thyromegaly present.  Cardiovascular: Normal rate, regular rhythm, normal heart sounds and intact distal pulses.  Exam reveals no gallop.   Pulmonary/Chest: Effort normal and breath sounds normal. No respiratory distress. She has no wheezes. She exhibits no tenderness.  Abdominal: Soft. Bowel sounds are normal. She exhibits no distension, no abdominal bruit and no mass. There is no tenderness.  Genitourinary: No breast swelling, tenderness, discharge or bleeding.  Genitourinary Comments: Breast exam: No mass, nodules, thickening, tenderness, bulging, retraction, inflamation, nipple discharge or skin changes noted.  No axillary or clavicular LA.      Musculoskeletal: She exhibits no edema or tenderness.  Poor rom LS and knees  Lymphadenopathy:    She has no cervical adenopathy.  Neurological: She is alert. She has normal reflexes. No cranial nerve deficit. She exhibits normal muscle tone. Coordination normal.  Skin: Skin is warm and dry. No rash noted. No erythema. No pallor.  Solar lentigines diffusely   Psychiatric: She has a normal mood and affect.          Assessment & Plan:

## 2016-10-11 NOTE — Patient Instructions (Addendum)
Start a water exercise program as soon as you can   Let me know in the future if you want to see orthopedics about knees   Look into the cologuard program- if affordable I want to sign you up  If not- we need to get you our stool kit   Take care of yourself   Try to get 1200-1500 mg of calcium per day with at least 1000 iu of vitamin D - for bone health

## 2016-10-12 ENCOUNTER — Encounter: Payer: Self-pay | Admitting: Gastroenterology

## 2016-10-12 ENCOUNTER — Telehealth: Payer: Self-pay | Admitting: Gastroenterology

## 2016-10-12 NOTE — Telephone Encounter (Signed)
Patient has been scheduled for an office visit to discuss colonoscopy due to BMI 12/07/16 with Dr.Danis.

## 2016-10-12 NOTE — Assessment & Plan Note (Signed)
Reviewed health habits including diet and exercise and skin cancer prevention Reviewed appropriate screening tests for age  Also reviewed health mt list, fam hx and immunization status , as well as social and family history   See HPI Labs rev Start a water exercise program as soon as you can   Let me know in the future if you want to see orthopedics about knees   Look into the cologuard program- if affordable I want to sign you up  If not- we need to get you our stool kit   Take care of yourself  We will refer you for a mammogram and also for sleep evaluation  Try to get 1200-1500 mg of calcium per day with at least 1000 iu of vitamin D - for bone health

## 2016-10-12 NOTE — Assessment & Plan Note (Signed)
With day time somnolence and non restful sleep in morbidly obese pt  Ref to sleep clinic to consider w/u for sleep apnea

## 2016-10-12 NOTE — Assessment & Plan Note (Signed)
Scheduled annual screening mammogram Nl breast exam today  Encouraged monthly self exams   

## 2016-10-12 NOTE — Telephone Encounter (Signed)
Looking at her previous colonoscopy report, she was supposed to have next colonoscopy in 5 years. I do see where 3 recall letter were mailed to her. Also Dr. Olevia Perches had suggested stool cards to be done every 2-3 years. Patient's BMI indicates that she will need to be scheduled at the hospital.

## 2016-10-12 NOTE — Telephone Encounter (Signed)
Marion-I have done a mammogram order (from her visit) and GI order for colonoscopy Thanks

## 2016-10-12 NOTE — Assessment & Plan Note (Signed)
Declines colonoscopy  Will check on cov of cologuard-if not covered will do ifob

## 2016-10-12 NOTE — Assessment & Plan Note (Signed)
Discussed how this problem influences overall health and the risks it imposes  Reviewed plan for weight loss with lower calorie diet (via better food choices and also portion control or program like weight watchers) and exercise building up to or more than 30 minutes 5 days per week including some aerobic activity   Will try water exercise

## 2016-10-12 NOTE — Assessment & Plan Note (Signed)
Lab Results  Component Value Date   HGBA1C 6.2 10/05/2016   disc imp of low glycemic diet and wt loss to prevent DM2

## 2016-11-13 ENCOUNTER — Institutional Professional Consult (permissible substitution): Payer: BLUE CROSS/BLUE SHIELD | Admitting: Internal Medicine

## 2016-12-07 ENCOUNTER — Ambulatory Visit: Payer: BLUE CROSS/BLUE SHIELD | Admitting: Gastroenterology

## 2017-04-02 ENCOUNTER — Ambulatory Visit (INDEPENDENT_AMBULATORY_CARE_PROVIDER_SITE_OTHER): Payer: BLUE CROSS/BLUE SHIELD

## 2017-04-02 ENCOUNTER — Other Ambulatory Visit: Payer: Self-pay | Admitting: Podiatry

## 2017-04-02 ENCOUNTER — Ambulatory Visit: Payer: BLUE CROSS/BLUE SHIELD | Admitting: Podiatry

## 2017-04-02 ENCOUNTER — Encounter: Payer: Self-pay | Admitting: Podiatry

## 2017-04-02 DIAGNOSIS — M79671 Pain in right foot: Secondary | ICD-10-CM | POA: Diagnosis not present

## 2017-04-02 DIAGNOSIS — M779 Enthesopathy, unspecified: Secondary | ICD-10-CM | POA: Diagnosis not present

## 2017-04-02 MED ORDER — TRIAMCINOLONE ACETONIDE 10 MG/ML IJ SUSP
10.0000 mg | Freq: Once | INTRAMUSCULAR | Status: AC
  Administered 2017-04-02: 10 mg

## 2017-04-02 NOTE — Progress Notes (Signed)
Subjective:   Patient ID: Barbara Kramer, female   DOB: 67 y.o.   MRN: [id number]   HPI Patient presents stating that she has had an acute history of pain in her right ankle and that it hard for her to be on her foot.  She is obese which is a complicating factor and is not able to be active currently and does not smoke currently   Review of Systems  All other systems reviewed and are negative.       Objective:  Physical Exam  Constitutional: She appears well-developed and well-nourished.  Cardiovascular: Intact distal pulses.  Pulmonary/Chest: Effort normal.  Musculoskeletal: Normal range of motion.  Neurological: She is alert.  Skin: Skin is warm.  Nursing note and vitals reviewed.   Neurovascular status was found to be intact muscle strength was adequate range of motion within normal limits with patient found to have moderate depression of the arch bilateral with inflammation of the posterior tibial tendon close to its insertion into the navicular.  I checked muscle strength of the tendon and I did not note what appears to be any damage to the tendon or loss of muscle strength at this time.  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  I went ahead today and did a sheath injection of posterior tib 3 mg Kenalog 5 mg Xylocaine and applied a fascial brace to lift up the arch.  I discussed long-term orthotics and will make that decision in 2 weeks but I do think it will be beneficial for her.  Reappoint at that time for reevaluation or earlier if needed  X-ray indicates moderate depression of the arch with no indication of bone injury stress fracture or advanced arthritis

## 2017-04-16 ENCOUNTER — Ambulatory Visit: Payer: BLUE CROSS/BLUE SHIELD | Admitting: Podiatry

## 2017-04-23 ENCOUNTER — Ambulatory Visit: Payer: BLUE CROSS/BLUE SHIELD | Admitting: Podiatry

## 2017-05-15 ENCOUNTER — Encounter: Payer: Self-pay | Admitting: Family Medicine

## 2017-05-15 ENCOUNTER — Ambulatory Visit: Payer: BLUE CROSS/BLUE SHIELD | Admitting: Family Medicine

## 2017-05-15 VITALS — BP 128/68 | HR 77 | Temp 98.0°F | Ht 62.0 in | Wt 295.5 lb

## 2017-05-15 DIAGNOSIS — J309 Allergic rhinitis, unspecified: Secondary | ICD-10-CM | POA: Diagnosis not present

## 2017-05-15 NOTE — Progress Notes (Signed)
Subjective:    Patient ID: Barbara Kramer, female    DOB: 19-Apr-1950, 67 y.o.   MRN: [id number]  HPI  Here for uri/sinus symptoms   Started 2 wk ago  Worse this am  Mucous is clear to slt yellow -nasal and pnd  rattly cough - that is improved (with mucinex)   Face does not hurt Some pressure  A lot of sneezing   No fever  No chills or body aches No sore throat   Temp: 98 F (36.7 C)     Wt Readings from Last 3 Encounters:  05/15/17 295 lb 8 oz (134 kg)  10/11/16 293 lb 8 oz (133.1 kg)  10/26/15 298 lb 6.4 oz (135.4 kg)   54.05 kg/m  Still in morbid obese category  Admittedly not watching diet   Patient Active Problem List   Diagnosis Date Noted  . Allergic rhinitis 05/15/2017  . Screening mammogram, encounter for 10/11/2016  . Snoring 10/11/2016  . Hyperglycemia 12/15/2014  . Osteoarthritis of left knee 10/16/2013  . Left knee pain 10/15/2013  . Colon cancer screening 11/20/2012  . Routine gynecological examination 11/17/2011  . Other screening mammogram 11/17/2011  . Low back pain 10/18/2010  . Routine general medical examination at a health care facility 10/06/2010  . 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 Morbid obesity ([place]) 08/02/2006  . ECZEMA 08/02/2006   Past Medical History:  Diagnosis Date  . Breast lump    left  . Eczema   . Family hx of colon cancer   . Kidney cysts    small, right 10/2002  . Normal cardiac stress test 9/10   exercise stress test normal but limited by exercise intolerance  . Obesity    Past Surgical History:  Procedure Laterality Date  . CHOLECYSTECTOMY  05/2002   [place]- gallstones, small renal cyst 05/2002  . FOOT SURGERY  11/1998   bunion  . PARTIAL HYSTERECTOMY  1998   fibroids, ovaries remain   Social History   Tobacco Use  . Smoking status: Former Smoker    Last attempt to quit: 03/20/1976    Years since quitting: 41.1  . Smokeless tobacco: Never Used  Substance Use Topics  . Alcohol use: No    Alcohol/week:  0.0 oz  . Drug use: No   Family History  Problem Relation Age of Onset  . Colon cancer Mother   . Lung cancer Father        smoker  . Hypertension Sister   . Stroke Maternal Grandmother   . Stroke Paternal Grandmother   . Hypertension Sister   . Stomach cancer Mother    Allergies  Allergen Reactions  . Calcium Carbonate     REACTION: constipation  . Penicillins     REACTION: rash  . Calcium Carbonate Antacid Rash   Current Outpatient Medications on File Prior to Visit  Medication Sig Dispense Refill  . [DISCONTINUED] famotidine (PEPCID) 20 MG tablet Take 1 tablet (20 mg total) by mouth 2 (two) times daily. 30 tablet 0   No current facility-administered medications on file prior to visit.      Review of Systems  Constitutional: Negative for activity change, appetite change, fatigue, fever and unexpected weight change.  HENT: Positive for postnasal drip, rhinorrhea and sneezing. Negative for congestion, ear pain, facial swelling, mouth sores, nosebleeds, sinus pressure, sinus pain, sore throat and trouble swallowing.   Eyes: Negative for pain, redness and visual disturbance.  Respiratory: Positive for cough. Negative for shortness of breath,  wheezing and stridor.   Cardiovascular: Negative for chest pain and palpitations.  Gastrointestinal: Negative for abdominal pain, blood in stool, constipation and diarrhea.  Endocrine: Negative for polydipsia and polyuria.  Genitourinary: Negative for dysuria, frequency and urgency.  Musculoskeletal: Negative for arthralgias, back pain and myalgias.  Skin: Negative for pallor and rash.  Allergic/Immunologic: Negative for environmental allergies.  Neurological: Negative for dizziness, syncope and headaches.  Hematological: Negative for adenopathy. Does not bruise/bleed easily.  Psychiatric/Behavioral: Negative for decreased concentration and dysphoric mood. The patient is not nervous/anxious.        Objective:   Physical Exam    Constitutional: She appears well-developed and well-nourished. No distress.  Morbidly obese and well app  HENT:  Head: Normocephalic and atraumatic.  Right Ear: External ear normal.  Left Ear: External ear normal.  Mouth/Throat: Oropharynx is clear and moist. No oropharyngeal exudate.  Nares are boggy and injected Clear rhinorrhea and pnd  Some sneezing    Eyes: Conjunctivae and EOM are normal. Pupils are equal, round, and reactive to light. Right eye exhibits no discharge. Left eye exhibits no discharge.  Neck: Normal range of motion. Neck supple.  Cardiovascular: Normal rate, regular rhythm and normal heart sounds.  No murmur heard. Pulmonary/Chest: Effort normal and breath sounds normal. No respiratory distress. She has no wheezes. She has no rales.  Good air exch  Lymphadenopathy:    She has no cervical adenopathy.  Skin: Skin is warm and dry. No rash noted. No erythema.  Psychiatric: She has a normal mood and affect.          Assessment & Plan:   Problem List Items Addressed This Visit      Respiratory   Allergic rhinitis - Primary    Suspect combination of this and end of viral uri  Reassuring exam  Recommend otc antihistamine (claritin or allegra or zyrtec) as directed (no D) Also steroid ns (flonase or nasaocort) as directed once daily  Until improved Avoid allergens when able  Update if not starting to improve in a week or if worsening   Also if facial pain         Other   Morbid obesity ([place])    Discussed how this problem influences overall health and the risks it imposes  Reviewed plan for weight loss with lower calorie diet (via better food choices and also portion control or program like weight watchers) and exercise building up to or more than 30 minutes 5 days per week including some aerobic activity   Not motivated to change diet  Exercise limited due to chronic knee and ankle pain

## 2017-05-15 NOTE — Patient Instructions (Signed)
I think you may have a combination of allergies and the end of a cold  If you develop facial/sinus pain or fever please let us know  Try an antihistamine over the counter like allegra/claritin or zyrtec  Avoid sudafed or products with "D"  Also get either flonase or nasacort nasal spray and use 2 sprays in each nostril once per day   These are for runny and stuffy nose and sneezing  Use as long as you think you need to

## 2017-05-15 NOTE — Assessment & Plan Note (Signed)
Suspect combination of this and end of viral uri  Reassuring exam  Recommend otc antihistamine (claritin or allegra or zyrtec) as directed (no D) Also steroid ns (flonase or nasaocort) as directed once daily  Until improved Avoid allergens when able  Update if not starting to improve in a week or if worsening   Also if facial pain

## 2017-05-15 NOTE — Assessment & Plan Note (Signed)
Discussed how this problem influences overall health and the risks it imposes  Reviewed plan for weight loss with lower calorie diet (via better food choices and also portion control or program like weight watchers) and exercise building up to or more than 30 minutes 5 days per week including some aerobic activity   Not motivated to change diet  Exercise limited due to chronic knee and ankle pain

## 2017-06-13 ENCOUNTER — Encounter: Payer: Self-pay | Admitting: Primary Care

## 2017-06-13 ENCOUNTER — Ambulatory Visit: Payer: BLUE CROSS/BLUE SHIELD | Admitting: Primary Care

## 2017-06-13 VITALS — BP 120/76 | HR 74 | Temp 98.2°F | Ht 62.0 in | Wt 289.0 lb

## 2017-06-13 DIAGNOSIS — J029 Acute pharyngitis, unspecified: Secondary | ICD-10-CM

## 2017-06-13 LAB — POCT RAPID STREP A (OFFICE): Rapid Strep A Screen: NEGATIVE

## 2017-06-13 MED ORDER — AZITHROMYCIN 250 MG PO TABS: ORAL_TABLET | ORAL | 0 refills | Status: DC

## 2017-06-13 NOTE — Patient Instructions (Signed)
You have pharyngitis. We are prescribing Azithromycin. Take 2 tablets today and then 1 tablet for 4 days.  For your sore throat, warm salt water gargling 3 times a day  For fever and pain, take Tylenol 650mg  every 6hrs.   It has been a pleasure seeing you today. Denita Lung, RN, Adult-Geriatric Nurse Practitioner Student and Allie Bossier, AGNP  Azithromycin tablets What is this medicine? AZITHROMYCIN (az ith roe MYE sin) is a macrolide antibiotic. It is used to treat or prevent certain kinds of bacterial infections. It will not work for colds, flu, or other viral infections. This medicine may be used for other purposes; ask your health care provider or pharmacist if you have questions. COMMON BRAND NAME(S): Zithromax, Zithromax Tri-Pak, Zithromax Z-Pak What should I tell my health care provider before I take this medicine? They need to know if you have any of these conditions: -kidney disease -liver disease -irregular heartbeat or heart disease -an unusual or allergic reaction to azithromycin, erythromycin, other macrolide antibiotics, foods, dyes, or preservatives -pregnant or trying to get pregnant -breast-feeding How should I use this medicine? Take this medicine by mouth with a full glass of water. Follow the directions on the prescription label. The tablets can be taken with food or on an empty stomach. If the medicine upsets your stomach, take it with food. Take your medicine at regular intervals. Do not take your medicine more often than directed. Take all of your medicine as directed even if you think your are better. Do not skip doses or stop your medicine early. Talk to your pediatrician regarding the use of this medicine in children. While this drug may be prescribed for children as young as 6 months for selected conditions, precautions do apply. Overdosage: If you think you have taken too much of this medicine contact a poison control center or emergency room at once. NOTE:  This medicine is only for you. Do not share this medicine with others. What if I miss a dose? If you miss a dose, take it as soon as you can. If it is almost time for your next dose, take only that dose. Do not take double or extra doses. What may interact with this medicine? Do not take this medicine with any of the following medications: -lincomycin This medicine may also interact with the following medications: -amiodarone -antacids -birth control pills -cyclosporine -digoxin -magnesium -nelfinavir -phenytoin -warfarin This list may not describe all possible interactions. Give your health care provider a list of all the medicines, herbs, non-prescription drugs, or dietary supplements you use. Also tell them if you smoke, drink alcohol, or use illegal drugs. Some items may interact with your medicine. What should I watch for while using this medicine? Tell your doctor or healthcare professional if your symptoms do not start to get better or if they get worse. Do not treat diarrhea with over the counter products. Contact your doctor if you have diarrhea that lasts more than 2 days or if it is severe and watery. This medicine can make you more sensitive to the sun. Keep out of the sun. If you cannot avoid being in the sun, wear protective clothing and use sunscreen. Do not use sun lamps or tanning beds/booths. What side effects may I notice from receiving this medicine? Side effects that you should report to your doctor or health care professional as soon as possible: -allergic reactions like skin rash, itching or hives, swelling of the face, lips, or tongue -confusion, nightmares or hallucinations -  dark urine -difficulty breathing -hearing loss -irregular heartbeat or chest pain -pain or difficulty passing urine -redness, blistering, peeling or loosening of the skin, including inside the mouth -white patches or sores in the mouth -yellowing of the eyes or skin Side effects that  usually do not require medical attention (report to your doctor or health care professional if they continue or are bothersome): -diarrhea -dizziness, drowsiness -headache -stomach upset or vomiting -tooth discoloration -vaginal irritation This list may not describe all possible side effects. Call your doctor for medical advice about side effects. You may report side effects to FDA at 1-800-FDA-1088. Where should I keep my medicine? Keep out of the reach of children. Store at room temperature between 15 and 30 degrees C (59 and 86 degrees F). Throw away any unused medicine after the expiration date. NOTE: This sheet is a summary. It may not cover all possible information. If you have questions about this medicine, talk to your doctor, pharmacist, or health care provider.  2018 Elsevier/Gold Standard (2015-05-04 15:26:03)  Pharyngitis Pharyngitis is a sore throat (pharynx). There is redness, pain, and swelling of your throat. Follow these instructions at home:  Drink enough fluids to keep your pee (urine) clear or pale yellow.  Only take medicine as told by your doctor. ? You may get sick again if you do not take medicine as told. Finish your medicines, even if you start to feel better. ? Do not take aspirin.  Rest.  Rinse your mouth (gargle) with salt water ( tsp of salt per 1 qt of water) every 1-2 hours. This will help the pain.  If you are not at risk for choking, you can suck on hard candy or sore throat lozenges. Contact a doctor if:  You have large, tender lumps on your neck.  You have a rash.  You cough up green, yellow-brown, or bloody spit. Get help right away if:  You have a stiff neck.  You drool or cannot swallow liquids.  You throw up (vomit) or are not able to keep medicine or liquids down.  You have very bad pain that does not go away with medicine.  You have problems breathing (not from a stuffy nose). This information is not intended to replace advice  given to you by your health care provider. Make sure you discuss any questions you have with your health care provider. Document Released: 08/23/2007 Document Revised: 08/12/2015 Document Reviewed: 11/11/2012 Elsevier Interactive Patient Education  2017 [employer].

## 2017-06-13 NOTE — Progress Notes (Signed)
Subjective:    Patient ID: Barbara Kramer, female    DOB: 1950-10-10, 67 y.o.   MRN: [id number]  HPI Barbara Kramer is a 67 y.o. female who presents today with a CC of swollen Tonsils. Had a sore throat on Sunday but relieved with lozenges. Is constantly clearing yellow sputum x2 nights. Denies any Cough, Fever, Chills, SOB, or Chest Pain. She does think she may have had a fever but didn't feel it or take anything. Pt has seasonal allergies and taking generic brand antihistamine and flonase as needed. 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  HENT: Positive for postnasal drip and rhinorrhea. Negative for congestion, ear pain, sinus pressure and sinus pain.   Respiratory: Negative for chest tightness and shortness of breath.   Cardiovascular: Negative for chest pain.       Past Medical History:  Diagnosis Date  . Breast lump    left  . Eczema   . Family hx of colon cancer   . Kidney cysts    small, right 10/2002  . Normal cardiac stress test 9/10   exercise stress test normal but limited by exercise intolerance  . Obesity    Past Surgical History:  Procedure Laterality Date  . CHOLECYSTECTOMY  05/2002   [place]- gallstones, small renal cyst 05/2002  . FOOT SURGERY  11/1998   bunion  . PARTIAL HYSTERECTOMY  1998   fibroids, ovaries remain   Social History   Socioeconomic History  . Marital status: Single    Spouse name: Not on file  . Number of children: 2  . Years of education: Not on file  . Highest education level: Not on file  Occupational History    Employer: [employer]  Social Needs  . Financial resource strain: Not on file  . Food insecurity:    Worry: Not on file    Inability: Not on file  . Transportation needs:    Medical: Not on file    Non-medical: Not on file  Tobacco Use  . Smoking status: Former Smoker    Last attempt to quit: 03/20/1976    Years since  quitting: 41.2  . Smokeless tobacco: Never Used  Substance and Sexual Activity  . Alcohol use: No    Alcohol/week: 0.0 oz  . Drug use: No  . Sexual activity: Not on file  Lifestyle  . Physical activity:    Days per week: Not on file    Minutes per session: Not on file  . Stress: Not on file  Relationships  . Social connections:    Talks on phone: Not on file    Gets together: Not on file    Attends religious service: Not on file    Active member of club or organization: Not on file    Attends meetings of clubs or organizations: Not on file    Relationship status: Not on file  . Intimate partner violence:    Fear of current or ex partner: Not on file    Emotionally abused: Not on file    Physically abused: Not on file    Forced sexual activity: Not on file  Other Topics Concern  . Not on file  Social History Narrative  . Not on file   Family History  Problem Relation Age of Onset  . Colon cancer Mother   . Lung cancer Father        smoker  .  Hypertension Sister   . Stroke Maternal Grandmother   . Stroke Paternal Grandmother   . Hypertension Sister   . Stomach cancer Mother    Current Outpatient Medications on File Prior to Visit  Medication Sig Dispense Refill  . [DISCONTINUED] famotidine (PEPCID) 20 MG tablet Take 1 tablet (20 mg total) by mouth 2 (two) times daily. 30 tablet 0   No current facility-administered medications on file prior to visit.     Objective:   Physical Exam  Constitutional: She appears well-nourished. No distress.  HENT:  Right Ear: External ear normal.  Left Ear: External ear normal.  Nose: Nose normal. No mucosal edema. Right sinus exhibits no maxillary sinus tenderness and no frontal sinus tenderness. Left sinus exhibits no maxillary sinus tenderness and no frontal sinus tenderness.  Unable to visualize TMs  Neck: Neck supple.  Cardiovascular: Normal rate, regular rhythm and normal heart sounds. Exam reveals no gallop and no friction  rub.  No murmur heard. Lymphadenopathy:       Head (right side): Tonsillar adenopathy present.       Head (left side): Tonsillar adenopathy present.    She has no cervical adenopathy.    BP 120/76   Pulse 74   Temp 98.2 F (36.8 C) (Oral)   Ht 5\' 2"  (1.575 m)   Wt 289 lb (131.1 kg)   SpO2 98%   BMI 52.86 kg/m      Assessment & Plan:   1. Pharyngitis, unspecified etiology - Rapid Step Negative - azithromycin (ZITHROMAX) 250 MG tablet; Take 2 tablets today and then take 1 tablet daily for 4 days for Sore Throat  Dispense: 6 tablet; Refill: 0   Denita Lung, RN, Adult-Geriatric Nurse Practitioner Student

## 2017-06-13 NOTE — Progress Notes (Signed)
Subjective:    Patient ID: Barbara Kramer, female    DOB: 24-Mar-1950, 67 y.o.   MRN: [id number]  HPI  [name] is a 67 year old female with a history of allergic rhinitis who presents today with a chief complaint of sore throat.  She also reports tonsillar swelling. Her symptoms began 3 days ago with sore throat, then over the last 2 days has noticed tonsillar swelling. She's been using throat lozenges, Flonase, antihisamine with some improvement. 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   Review of Systems  Constitutional: Negative for chills, fatigue and fever.  HENT: Positive for postnasal drip and sore throat. Negative for congestion and trouble swallowing.   Respiratory: Negative for cough.   Cardiovascular: Negative for chest pain.       Past Medical History:  Diagnosis Date  . Breast lump    left  . Eczema   . Family hx of colon cancer   . Kidney cysts    small, right 10/2002  . Normal cardiac stress test 9/10   exercise stress test normal but limited by exercise intolerance  . Obesity      Social History   Socioeconomic History  . Marital status: Single    Spouse name: Not on file  . Number of children: 2  . Years of education: Not on file  . Highest education level: Not on file  Occupational History    Employer: [employer]  Social Needs  . Financial resource strain: Not on file  . Food insecurity:    Worry: Not on file    Inability: Not on file  . Transportation needs:    Medical: Not on file    Non-medical: Not on file  Tobacco Use  . Smoking status: Former Smoker    Last attempt to quit: 03/20/1976    Years since quitting: 41.2  . Smokeless tobacco: Never Used  Substance and Sexual Activity  . Alcohol use: No    Alcohol/week: 0.0 oz  . Drug use: No  . Sexual activity: Not on file  Lifestyle  . Physical activity:    Days per week: Not on file    Minutes per session: Not on file  . Stress: Not on file    Relationships  . Social connections:    Talks on phone: Not on file    Gets together: Not on file    Attends religious service: Not on file    Active member of club or organization: Not on file    Attends meetings of clubs or organizations: Not on file    Relationship status: Not on file  . Intimate partner violence:    Fear of current or ex partner: Not on file    Emotionally abused: Not on file    Physically abused: Not on file    Forced sexual activity: Not on file  Other Topics Concern  . Not on file  Social History Narrative  . Not on file    Past Surgical History:  Procedure Laterality Date  . CHOLECYSTECTOMY  05/2002   [place]- gallstones, small renal cyst 05/2002  . FOOT SURGERY  11/1998   bunion  . PARTIAL HYSTERECTOMY  1998   fibroids, ovaries remain    Family History  Problem Relation Age of Onset  . Colon cancer Mother   . Lung cancer Father        smoker  . Hypertension Sister   . Stroke Maternal Grandmother   .  Stroke Paternal Grandmother   . Hypertension Sister   . Stomach cancer Mother     Allergies  Allergen Reactions  . Calcium Carbonate     REACTION: constipation  . Penicillins     REACTION: rash  . Calcium Carbonate Antacid Rash    Current Outpatient Medications on File Prior to Visit  Medication Sig Dispense Refill  . [DISCONTINUED] famotidine (PEPCID) 20 MG tablet Take 1 tablet (20 mg total) by mouth 2 (two) times daily. 30 tablet 0   No current facility-administered medications on file prior to visit.     BP 120/76   Pulse 74   Temp 98.2 F (36.8 C) (Oral)   Ht 5\' 2"  (1.575 m)   Wt 289 lb (131.1 kg)   SpO2 98%   BMI 52.86 kg/m    Objective:   Physical Exam  Constitutional: She appears well-nourished.  HENT:  Right Ear: Tympanic membrane and ear canal normal.  Left Ear: Tympanic membrane and ear canal normal.  Nose: Right sinus exhibits no maxillary sinus tenderness and no frontal sinus tenderness. Left sinus exhibits no  maxillary sinus tenderness and no frontal sinus tenderness.  Mouth/Throat: Oropharyngeal exudate, posterior oropharyngeal edema and posterior oropharyngeal erythema present.  Eyes: Conjunctivae are normal.  Cardiovascular: Normal rate and regular rhythm.  Pulmonary/Chest: Effort normal and breath sounds normal. She has no wheezes. She has no rales.  Lymphadenopathy:    She has no cervical adenopathy.  Skin: Skin is warm and dry.          Assessment & Plan:  Pharyngitis:  Also with tonsillar swelling.  Exam with moderate tonsillar edema, exudate, erythema. Rapid strep negative. Exam today highly suspicious for infection.  Based off of presentation and HPI, will treat. Rx for Azithromycin sent to pharmacy, PCN allergy.  Discussed warm salt gargles, Ibuprofen/Tylenol  Pleas Koch, NP

## 2017-06-26 ENCOUNTER — Telehealth: Payer: Self-pay | Admitting: Podiatry

## 2017-06-26 NOTE — Telephone Encounter (Signed)
I was calling to see if I could get a copy of x-rays I had done earlier this year. Yes ma'am, I can get those burned to a CD for you but you would need to fill out and sign a medical records release form. Okay, that is fine. When would you like to pick up the CD of x-rays? Are you open this Friday? Yes ma'am, we are open from 7:30 am to 4:00 pm. Do you close for lunch? No ma'am, we are open. I will be by sometime after 11:30 am. That is fine. What I will do is go ahead and burn your x-rays to a CD and leave up front here in the [place] office for you to pick up. When you pick them up I will leave the medical records release form for you to fill out and sign. Also, so you are aware, there is a $5.00 fee. That is fine.

## 2017-07-16 ENCOUNTER — Telehealth: Payer: Self-pay | Admitting: Family Medicine

## 2017-07-16 DIAGNOSIS — R0683 Snoring: Secondary | ICD-10-CM

## 2017-07-16 NOTE — Telephone Encounter (Signed)
Ref done  Will route to [facility] 

## 2017-07-16 NOTE — Telephone Encounter (Signed)
Copied from [place]. Topic: Referral - Request >> Jul 16, 2017 10:12 AM Margot Ables wrote: Reason for CRM: pt is wanting to schedule appt with [facility] for sleep study consultation. She declined/cancelled previously. Please enter new referral as needed.

## 2017-07-17 ENCOUNTER — Encounter: Payer: Self-pay | Admitting: Family Medicine

## 2017-07-17 DIAGNOSIS — Z1211 Encounter for screening for malignant neoplasm of colon: Secondary | ICD-10-CM

## 2017-07-17 NOTE — Telephone Encounter (Signed)
Pulmonary consult scheduled and patient notified.

## 2017-07-19 ENCOUNTER — Encounter: Payer: Self-pay | Admitting: Gastroenterology

## 2017-07-20 ENCOUNTER — Ambulatory Visit (INDEPENDENT_AMBULATORY_CARE_PROVIDER_SITE_OTHER): Payer: BLUE CROSS/BLUE SHIELD | Admitting: Internal Medicine

## 2017-07-20 ENCOUNTER — Encounter: Payer: Self-pay | Admitting: Internal Medicine

## 2017-07-20 VITALS — BP 128/78 | HR 78 | Ht 62.0 in | Wt 294.0 lb

## 2017-07-20 DIAGNOSIS — R351 Nocturia: Secondary | ICD-10-CM | POA: Diagnosis not present

## 2017-07-20 DIAGNOSIS — G4719 Other hypersomnia: Secondary | ICD-10-CM | POA: Diagnosis not present

## 2017-07-20 NOTE — Patient Instructions (Addendum)
Will send for sleep study.  Try to sleep for the entire night in your bed with the CPAP.

## 2017-07-20 NOTE — Progress Notes (Signed)
[facility] Consultation      Assessment and Plan:  Excessive daytime sleepiness. -Her symptoms and signs of obstructive sleep apnea, will send for sleep study. - Patient's spends significant amount of sleep time in a recliner, encouraged her to move back to her bed at a set time every night.  Nocturia. - Patient has mild pedal edema which may be contributing to nocturia. - We will see if patient symptoms improve with CPAP, if not she may benefit from diuretic.  Morbid obesity. - BMI 54, discussed that weight loss would be beneficial for her health.   Date: 07/20/2017  MRN# [id number] Barbara Kramer 1950/08/20   Barbara STANKE is a 67 y.o. old female seen in consultation for chief complaint of:    Chief Complaint  Patient presents with  . Consult    ref by [employer] for sleep issues: daytime sleepiness: wakes up choking: snores    HPI:   She has been having trouble with falling asleep at inappropriate times. She gets home in and parks her car in the driveway, then falls asleep. She dozes off in her recliner watching television usually before 11. She then wakes at 12 to 230 and goes to her bed. When she is bed she gets up 2-3 times to urinate, but she does not have to do this in the recliner. She wakes up 630 whether she has to work or not. She works part time as Physicist, medical.  Her weight is at it's highest point. Her ESS is 20.  No sleep walking no sleep attacks.  She denies jaw pain or TMJ.    PMHX:   Past Medical History:  Diagnosis Date  . Breast lump    left  . Eczema   . Family hx of colon cancer   . Kidney cysts    small, right 10/2002  . Normal cardiac stress test 9/10   exercise stress test normal but limited by exercise intolerance  . Obesity    Surgical Hx:  Past Surgical History:  Procedure Laterality Date  . CHOLECYSTECTOMY  05/2002   [place]- gallstones, small renal cyst 05/2002  . FOOT SURGERY  11/1998   bunion  . PARTIAL  HYSTERECTOMY  1998   fibroids, ovaries remain   Family Hx:  Family History  Problem Relation Age of Onset  . Colon cancer Mother   . Stomach cancer Mother   . Lung cancer Father        smoker  . Hypertension Sister   . Stroke Maternal Grandmother   . Stroke Paternal Grandmother   . Hypertension Sister    Social Hx:   Social History   Tobacco Use  . Smoking status: Former Smoker    Last attempt to quit: 03/20/1976    Years since quitting: 41.3  . Smokeless tobacco: Never Used  Substance Use Topics  . Alcohol use: No    Alcohol/week: 0.0 oz  . Drug use: No   Medication:   No current outpatient medications on file.   Allergies:  Calcium carbonate; Penicillins; and Calcium carbonate antacid  Review of Systems: Gen:  Denies  fever, sweats, chills HEENT: Denies blurred vision, double vision. bleeds, sore throat Cvc:  No dizziness, chest pain. Resp:   Denies cough or sputum production, shortness of breath Gi: Denies swallowing difficulty, stomach pain. Gu:  Denies bladder incontinence, burning urine Ext:   No Joint pain, stiffness. Skin: No skin rash,  hives  Endoc:  No polyuria, polydipsia. Psych: No  depression, insomnia. Other:  All other systems were reviewed with the patient and were negative other that what is mentioned in the HPI.   Physical Examination:   VS: BP 128/78 (BP Location: Left Arm, Cuff Size: Large)   Pulse 78   Ht 5\' 2"  (1.575 m)   Wt 294 lb (133.4 kg)   SpO2 96%   BMI 53.77 kg/m   General Appearance: No distress  Neuro:without focal findings,  speech normal,  HEENT: PERRLA, EOM intact.   Pulmonary: normal breath sounds, No wheezing.  CardiovascularNormal S1,S2.  No m/r/g.   Abdomen: Benign, Soft, non-tender. Renal:  No costovertebral tenderness  GU:  No performed at this time. Endoc: No evident thyromegaly, no signs of acromegaly. Skin:   warm, no rashes, no ecchymosis  Extremities: normal, no cyanosis, clubbing.  Other findings:     LABORATORY PANEL:   CBC No results for input(s): WBC, HGB, HCT, PLT in the last 168 hours. ------------------------------------------------------------------------------------------------------------------  Chemistries  No results for input(s): NA, K, CL, CO2, GLUCOSE, BUN, CREATININE, CALCIUM, MG, AST, ALT, ALKPHOS, BILITOT in the last 168 hours.  Invalid input(s): GFRCGP ------------------------------------------------------------------------------------------------------------------  Cardiac Enzymes No results for input(s): TROPONINI in the last 168 hours. ------------------------------------------------------------  RADIOLOGY:  No results found.     Thank  you for the consultation and for allowing [facility] to assist in the care of your patient. Our recommendations are noted above.  Please contact us if we can be of further service.   Marda Stalker, MD.  Board Certified in Internal Medicine, Pulmonary Medicine, [place], and Sleep Medicine.  [facility] Office Number: [phone]  Arleth Pesa, M.D.  Merton Border, M.D  07/20/2017

## 2017-08-10 ENCOUNTER — Encounter: Payer: Self-pay | Admitting: Internal Medicine

## 2017-08-10 DIAGNOSIS — G4719 Other hypersomnia: Secondary | ICD-10-CM

## 2017-08-10 DIAGNOSIS — G4733 Obstructive sleep apnea (adult) (pediatric): Secondary | ICD-10-CM

## 2017-08-14 DIAGNOSIS — G4733 Obstructive sleep apnea (adult) (pediatric): Secondary | ICD-10-CM | POA: Diagnosis not present

## 2017-08-15 ENCOUNTER — Telehealth: Payer: Self-pay | Admitting: *Deleted

## 2017-08-15 DIAGNOSIS — G4733 Obstructive sleep apnea (adult) (pediatric): Secondary | ICD-10-CM

## 2017-08-15 NOTE — Telephone Encounter (Signed)
LMOM for pt to return call for Sleep Study results.   Pt's AHI was 27. She will be placed on CPAP auto 5-20 cm H2O. Order pending.

## 2017-08-15 NOTE — Telephone Encounter (Signed)
Pt returning our call  Patient states It is okay to leave results on voicemail  She is at work and not sure if she can answer when we call

## 2017-08-15 NOTE — Telephone Encounter (Signed)
Pt is returning your call

## 2017-08-15 NOTE — Telephone Encounter (Signed)
Pt informed of results. Order placed. Nothing further needed. 

## 2017-08-15 NOTE — Telephone Encounter (Signed)
Patient returning call for results States it is ok to leave on voicemail

## 2017-08-16 ENCOUNTER — Telehealth: Payer: Self-pay | Admitting: Internal Medicine

## 2017-08-16 NOTE — Telephone Encounter (Signed)
Pt needs to discuss her insurance regarding her sleep machine

## 2017-08-16 NOTE — Telephone Encounter (Signed)
Pt called her insurance company and was concerned about out of pocket cost. Assured that orders would be sent to her in-network DME company. She says she believes it is [facility].

## 2017-09-11 ENCOUNTER — Encounter: Payer: Self-pay | Admitting: Internal Medicine

## 2017-09-12 NOTE — Progress Notes (Signed)
[facility]     Assessment and Plan:  Obstructive sleep apnea -HST on 08/10/2017 showed an AHI of 27. --Doing well on CPAP, to continue. - She is interested in putting her own machine as her insurance is changing that would be less expensive for her to purchase out right rather than continuing to pay co-pays.  Therefore she is given a prescription for AutoPap with pressure range of 14-20 cmH2O.  Nocturia. - Improved since starting on CPAP.   Morbid obesity. - BMI 54, discussed that weight loss would be beneficial for her health.   Date: 09/12/2017  MRN# [id number] Barbara Kramer 08/27/1956   Barbara Kramer is a 67 y.o. old female seen in consultation for chief complaint of:    Chief Complaint  Patient presents with  . Follow-up  . Sleep Apnea    doing fine on CPAP    HPI:   She is feeling more rested during the day she is no longer snoring. She is going to lose insurance, and would like to purchase her own device. Her nocturia has improved.  She rinses her supplies every week.   **Review of download 08/13/2017-09/11/2017.  Uses greater than 4 hours a 67%.  Average usage on days used is 5 hours 57 minutes.  Auto set pressure 5-20.  Median pressure 10, 95th percentile pressure 14, maximum pressure 15.  Residual AHI 0.6.  Overall this shows suboptimal compliance with very good control of obstructive sleep apnea when the CPAP is used. **HST 08/10/2017>> AHI of 27.  Medication:   No current outpatient medications on file.   Allergies:  Calcium carbonate; Penicillins; and Calcium carbonate antacid  Review of Systems:  Constitutional: Feels well. Cardiovascular: No chest pain.  Pulmonary: Denies dyspnea.   The remainder of systems were reviewed and were found to be negative other than what is documented in the HPI.    Physical Examination:   VS: BP 136/80 (BP Location: Left Arm, Cuff Size: Normal)   Pulse 89   Ht 5\' 2"  (1.575 m)   Wt 296 lb  (134.3 kg)   SpO2 98%   BMI 54.14 kg/m   General Appearance: No distress  Neuro:without focal findings, mental status, speech normal, alert and oriented HEENT: PERRLA, EOM intact Pulmonary: No wheezing, No rales  CardiovascularNormal S1,S2.  No m/r/g.  Abdomen: Benign, Soft, non-tender, No masses Renal:  No costovertebral tenderness  GU:  No performed at this time. Endoc: No evident thyromegaly, no signs of acromegaly or Cushing features Skin:   warm, no rashes, no ecchymosis  Extremities: normal, no cyanosis, clubbing.        LABORATORY PANEL:   CBC No results for input(s): WBC, HGB, HCT, PLT in the last 168 hours. ------------------------------------------------------------------------------------------------------------------  Chemistries  No results for input(s): NA, K, CL, CO2, GLUCOSE, BUN, CREATININE, CALCIUM, MG, AST, ALT, ALKPHOS, BILITOT in the last 168 hours.  Invalid input(s): GFRCGP ------------------------------------------------------------------------------------------------------------------  Cardiac Enzymes No results for input(s): TROPONINI in the last 168 hours. ------------------------------------------------------------  RADIOLOGY:  No results found.     Thank  you for the consultation and for allowing [facility] to assist in the care of your patient. Our recommendations are noted above.  Please contact us if we can be of further service.   Marda Stalker, MD.  Board Certified in Internal Medicine, Pulmonary Medicine, [place], and Sleep Medicine.  [facility] Office Number: [phone]  Jaslen Pesa, M.D.  Shanon Brow  Alva Garnet, M.D  09/12/2017

## 2017-09-13 ENCOUNTER — Ambulatory Visit: Payer: BLUE CROSS/BLUE SHIELD | Admitting: Internal Medicine

## 2017-09-13 ENCOUNTER — Encounter: Payer: Self-pay | Admitting: Internal Medicine

## 2017-09-13 VITALS — BP 136/80 | HR 89 | Ht 62.0 in | Wt 296.0 lb

## 2017-09-13 DIAGNOSIS — G4733 Obstructive sleep apnea (adult) (pediatric): Secondary | ICD-10-CM

## 2017-09-13 NOTE — Patient Instructions (Addendum)
Continue to use your cpap every night for the whole night, every night.

## 2017-09-14 ENCOUNTER — Ambulatory Visit: Payer: BLUE CROSS/BLUE SHIELD | Admitting: Gastroenterology

## 2017-09-17 ENCOUNTER — Ambulatory Visit: Payer: BLUE CROSS/BLUE SHIELD | Admitting: Internal Medicine

## 2017-10-11 ENCOUNTER — Other Ambulatory Visit: Payer: BLUE CROSS/BLUE SHIELD

## 2017-10-17 ENCOUNTER — Encounter: Payer: BLUE CROSS/BLUE SHIELD | Admitting: Family Medicine

## 2017-12-28 ENCOUNTER — Telehealth: Payer: Self-pay | Admitting: Family Medicine

## 2017-12-28 DIAGNOSIS — Z1231 Encounter for screening mammogram for malignant neoplasm of breast: Secondary | ICD-10-CM

## 2017-12-28 NOTE — Telephone Encounter (Signed)
Mammogram ordered

## 2017-12-28 NOTE — Telephone Encounter (Signed)
Order faxed. Pt aware.

## 2017-12-28 NOTE — Telephone Encounter (Signed)
For insurance purpose pt needs referral to have screen mammogram @ [facility] Can you put screening mammogram order so I can fax to them Thanks  Pt stated she will make her own appointment

## 2018-01-02 DIAGNOSIS — Z1231 Encounter for screening mammogram for malignant neoplasm of breast: Secondary | ICD-10-CM | POA: Diagnosis not present

## 2018-01-19 DIAGNOSIS — H25813 Combined forms of age-related cataract, bilateral: Secondary | ICD-10-CM | POA: Diagnosis not present

## 2018-01-19 DIAGNOSIS — H5212 Myopia, left eye: Secondary | ICD-10-CM | POA: Diagnosis not present

## 2018-01-21 ENCOUNTER — Telehealth: Payer: Self-pay | Admitting: Family Medicine

## 2018-01-21 DIAGNOSIS — R739 Hyperglycemia, unspecified: Secondary | ICD-10-CM

## 2018-01-21 DIAGNOSIS — Z0000 Encounter for general adult medical examination without abnormal findings: Secondary | ICD-10-CM

## 2018-01-21 NOTE — Telephone Encounter (Signed)
-----   Message from Ellamae Sia sent at 01/14/2018  2:48 PM EDT ----- Regarding: Lab orders for Tuesday, 11.5.19 Patient is scheduled for CPX labs, please order future labs, Thanks , Karna Christmas

## 2018-01-21 NOTE — Telephone Encounter (Signed)
Terri-I do not see medicare as her ins-but if this is a welcome to medicare visit I will have to change diagnosis for labs Thanks

## 2018-01-21 NOTE — Telephone Encounter (Signed)
A1C is still hyperglycemia  Rest would be changed to "welcome" to medicare preventative visit  Thanks

## 2018-01-21 NOTE — Telephone Encounter (Signed)
I will check when she comes in, if so what dx will you want to use? We can change them by reordering

## 2018-01-22 ENCOUNTER — Other Ambulatory Visit: Payer: BLUE CROSS/BLUE SHIELD

## 2018-01-22 ENCOUNTER — Other Ambulatory Visit (INDEPENDENT_AMBULATORY_CARE_PROVIDER_SITE_OTHER): Payer: Medicare HMO

## 2018-01-22 ENCOUNTER — Other Ambulatory Visit: Payer: Self-pay

## 2018-01-22 DIAGNOSIS — Z0000 Encounter for general adult medical examination without abnormal findings: Secondary | ICD-10-CM

## 2018-01-22 LAB — COMPREHENSIVE METABOLIC PANEL
ALBUMIN: 4.3 g/dL (ref 3.5–5.2)
ALT: 13 U/L (ref 0–35)
AST: 17 U/L (ref 0–37)
Alkaline Phosphatase: 64 U/L (ref 39–117)
BUN: 14 mg/dL (ref 6–23)
CHLORIDE: 104 meq/L (ref 96–112)
CO2: 25 mEq/L (ref 19–32)
CREATININE: 1.02 mg/dL (ref 0.40–1.20)
Calcium: 9.6 mg/dL (ref 8.4–10.5)
GFR: 69.93 mL/min (ref >60.00)
Glucose, Bld: 98 mg/dL (ref 70–99)
Potassium: 3.8 mEq/L (ref 3.5–5.1)
SODIUM: 138 meq/L (ref 135–145)
TOTAL PROTEIN: 7.7 g/dL (ref 6.0–8.3)
Total Bilirubin: 0.3 mg/dL (ref 0.2–1.2)

## 2018-01-22 LAB — CBC WITH DIFFERENTIAL/PLATELET
BASOS ABS: 0 10*3/uL (ref 0.0–0.1)
Basophils Relative: 0.4 % (ref 0.0–3.0)
EOS ABS: 0.3 10*3/uL (ref 0.0–0.7)
Eosinophils Relative: 3 % (ref 0.0–5.0)
HCT: 40.9 % (ref 36.0–46.0)
Hemoglobin: 13.3 g/dL (ref 12.0–15.0)
Lymphocytes Relative: 33.2 % (ref 12.0–46.0)
Lymphs Abs: 2.9 10*3/uL (ref 0.7–4.0)
MCHC: 32.5 g/dL (ref 30.0–36.0)
MCV: 87.1 fl (ref 78.0–100.0)
MONO ABS: 0.5 10*3/uL (ref 0.1–1.0)
Monocytes Relative: 5.8 % (ref 3.0–12.0)
Neutro Abs: 5.1 10*3/uL (ref 1.4–7.7)
Neutrophils Relative %: 57.6 % (ref 43.0–77.0)
Platelets: 269 10*3/uL (ref 150.0–400.0)
RBC: 4.7 Mil/uL (ref 3.87–5.11)
RDW: 14.9 % (ref 11.5–15.5)
WBC: 8.8 10*3/uL (ref 4.0–10.5)

## 2018-01-22 LAB — LIPID PANEL
CHOLESTEROL: 151 mg/dL (ref 0–200)
HDL: 53.4 mg/dL (ref >39.00)
LDL CALC: 83 mg/dL (ref 0–99)
NonHDL: 97.41
Total CHOL/HDL Ratio: 3
Triglycerides: 70 mg/dL (ref 0.0–149.0)
VLDL: 14 mg/dL (ref 0.0–40.0)

## 2018-01-22 LAB — HEMOGLOBIN A1C: Hgb A1c MFr Bld: 6.2 % (ref 4.6–6.5)

## 2018-01-22 LAB — TSH: TSH: 3.32 u[IU]/mL (ref 0.35–4.50)

## 2018-01-22 NOTE — Addendum Note (Signed)
Addended by: Ellamae Sia on: 01/22/2018 08:18 AM   Modules accepted: Orders

## 2018-01-25 ENCOUNTER — Emergency Department (HOSPITAL_COMMUNITY)
Admission: EM | Admit: 2018-01-25 | Discharge: 2018-01-25 | Disposition: A | Discharge status: Home / Self Care | Payer: Medicare HMO | Attending: Emergency Medicine | Admitting: Emergency Medicine

## 2018-01-25 ENCOUNTER — Ambulatory Visit (HOSPITAL_BASED_OUTPATIENT_CLINIC_OR_DEPARTMENT_OTHER)
Admit: 2018-01-25 | Discharge: 2018-01-25 | Disposition: A | Discharge status: Home / Self Care | Payer: Medicare HMO | Attending: Emergency Medicine | Admitting: Emergency Medicine

## 2018-01-25 ENCOUNTER — Other Ambulatory Visit: Payer: Self-pay

## 2018-01-25 ENCOUNTER — Encounter (HOSPITAL_COMMUNITY): Payer: Self-pay | Admitting: Obstetrics and Gynecology

## 2018-01-25 DIAGNOSIS — R52 Pain, unspecified: Secondary | ICD-10-CM | POA: Diagnosis not present

## 2018-01-25 DIAGNOSIS — M79605 Pain in left leg: Secondary | ICD-10-CM

## 2018-01-25 DIAGNOSIS — Z87891 Personal history of nicotine dependence: Secondary | ICD-10-CM | POA: Diagnosis not present

## 2018-01-25 DIAGNOSIS — Z6841 Body Mass Index (BMI) 40.0 and over, adult: Secondary | ICD-10-CM | POA: Insufficient documentation

## 2018-01-25 NOTE — ED Triage Notes (Signed)
Pt reports tightness and pain in the left leg. Pt reports cramps in her leg and reports she has concerns about a DVT.

## 2018-01-25 NOTE — Discharge Instructions (Addendum)
You were evaluated today for leg pain. [place] negative for blood clot. This most likely musculoskeletal nature.  Increase your water intake.  Please follow-up primary care provider for reevaluation.  Return to the ED for any worsening symptoms.

## 2018-01-25 NOTE — ED Notes (Signed)
Vascular tech at bedside. °

## 2018-01-25 NOTE — ED Provider Notes (Signed)
[facility] DEPT Provider Note   CSN: [id number] Arrival date & time: 01/25/18  1231     History   Chief Complaint Chief Complaint  Patient presents with  . Leg Pain    HPI Barbara Kramer is a 67 y.o. female with past medical history significant for chronic left knee pain and morbid obesity who presents for evaluation of left lower extremity pain and swelling. Patient states that yesterday evening she experienced "a charley horse in my entire left leg." States that she awoke this morning and her left lower extremity was swollen and painful. Able to ambulate without difficulty. States the pain is a dull nagging sensation. Rates her pain a 5/10. Denies radiation of pain. Pain is located to the left posterior and left lateral left lower extremity.  Denies history of previous DVT, denies hormone use, recent travel or recent immobility, recent surgeries or history of malignancy.  Denies fever, chills, chest pain, shortness of breath, hemoptysis, numbness or tingling in her lower extremity, decreased range of motion her extremities.  Patient states that 2 of her children do have a history of unprovoked DVTs. Denies trauma or recent injury.  History obtained from patient.  No interpreter was used. HPI  Past Medical History:  Diagnosis Date  . Breast lump    left  . Eczema   . Family hx of colon cancer   . Kidney cysts    small, right 10/2002  . Normal cardiac stress test 9/10   exercise stress test normal but limited by exercise intolerance  . Obesity     Patient Active Problem List   Diagnosis Date Noted  . Allergic rhinitis 05/15/2017  . Screening mammogram, encounter for 10/11/2016  . Snoring 10/11/2016  . Hyperglycemia 12/15/2014  . Osteoarthritis of left knee 10/16/2013  . Left knee pain 10/15/2013  . Colon cancer screening 11/20/2012  . Routine gynecological examination 11/17/2011  . Other screening mammogram 11/17/2011  . Low back pain  10/18/2010  . Routine general medical examination at a health care facility 10/06/2010  . 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 Morbid obesity ([place]) 08/02/2006  . ECZEMA 08/02/2006    Past Surgical History:  Procedure Laterality Date  . CHOLECYSTECTOMY  05/2002   [place]- gallstones, small renal cyst 05/2002  . FOOT SURGERY  11/1998   bunion  . PARTIAL HYSTERECTOMY  1998   fibroids, ovaries remain     OB History    Gravida      Para      Term      Preterm      AB      Living  2     SAB      TAB      Ectopic      Multiple      Live Births               Home Medications    Prior to Admission medications   Medication Sig Start Date End Date Taking? Authorizing Provider  famotidine (PEPCID) 20 MG tablet Take 1 tablet (20 mg total) by mouth 2 (two) times daily. 01/23/11 06/05/11  Abran Richard, PA-C    Family History Family History  Problem Relation Age of Onset  . Colon cancer Mother   . Stomach cancer Mother   . Lung cancer Father        smoker  . Hypertension Sister   . Stroke Maternal Grandmother   . Stroke Paternal Grandmother   . Hypertension Sister  Social History Social History   Tobacco Use  . Smoking status: Former Smoker    Last attempt to quit: 03/20/1976    Years since quitting: 41.8  . Smokeless tobacco: Never Used  Substance Use Topics  . Alcohol use: No    Alcohol/week: 0.0 standard drinks  . Drug use: No     Allergies   Calcium carbonate and Penicillins   Review of Systems Review of Systems  Constitutional: Negative.   Respiratory: Negative.   Cardiovascular: Negative.   Gastrointestinal: Negative.   Musculoskeletal:       Left lower extremity pain.  Skin: Negative.   Neurological: Negative.   All other systems reviewed and are negative.    Physical Exam Updated Vital Signs BP (!) 162/83 (BP Location: Right Arm)   Pulse 67   Temp 97.7 F (36.5 C) (Oral)   Resp 18   Ht 5\' 2"  (1.575 m)   Wt 132.5 kg    SpO2 100%   BMI 53.41 kg/m   Physical Exam  Constitutional: She appears well-developed and well-nourished. No distress.  HENT:  Head: Atraumatic.  Eyes: Pupils are equal, round, and reactive to light.  Neck: Normal range of motion.  Cardiovascular: Normal rate, regular rhythm, normal heart sounds and intact distal pulses. Exam reveals no gallop and no friction rub.  No murmur heard. Pulmonary/Chest: Effort normal and breath sounds normal. No stridor. No respiratory distress. She has no wheezes. She has no rales.  Abdominal: She exhibits no distension.  Musculoskeletal: Normal range of motion.  Patient ambulating in room without difficulty on initial evaluation.  Tenderness to palpation over left posterior lower extremity.  No tenderness palpation over left hip left knee or left ankle.  Full range of motion bilateral lower extremity without difficulty.  No tenderness to medial lateral joint line on left knee.  Negative varus valgus stress to bilateral lower extremity.  Negative anterior drawer bilateral lower extremity.  No gross edema to bilateral lower extremity, however exam is limited due to patient's morbid obesity.  Bilateral calves measure 23 cm.  Neurological: She is alert.  Intact sensation to sharp and dull lateral lower extremity.  Skin: Skin is warm and dry. She is not diaphoretic.  No edema, erythema, ecchymosis or warmth to bilateral lower extremity.  Psychiatric: She has a normal mood and affect.  Nursing note and vitals reviewed.    ED Treatments / Results  Labs (all labs ordered are listed, but only abnormal results are displayed) Labs Reviewed - No data to display  EKG None  Radiology No results found.  Procedures Procedures (including critical care time)  Medications Ordered in ED Medications - No data to display   Initial Impression / Assessment and Plan / ED Course  I have reviewed the triage vital signs and the nursing notes.  Pertinent labs &  imaging results that were available during my care of the patient were reviewed by me and considered in my medical decision making (see chart for details).  67 year old female who appears otherwise well presents for evaluation of left lower extremity pain.  Normal musculoskeletal exam.  Neurovascularly intact.  No gross edema to left lower extremity, however exam is limited secondary to patient morbid obesity.  No erythema, warmth to bilateral lower extremity.  Able to ambulate without difficulty.  Due to difficulty assessing possible edema given obesity will obtain ultrasound to r/o DVT.  Lower extremity compartments are soft.  Will reassess.  Patient does not want anything for pain at this  time.  [place] negative for DVT.  Pain is most likely musculoskeletal nature.  Patient is able to ambulate without difficulty.  Low suspicion for emergent pathology causing patient's discomfort at this time.  Discussed strict return precautions.  Patient voiced understanding is agreeable for follow-up.    Final Clinical Impressions(s) / ED Diagnoses   Final diagnoses:  Left leg pain    ED Discharge Orders    None       Taedyn Glasscock A, PA-C 01/25/18 1543    Orlie Dakin, MD 01/25/18 1622

## 2018-01-25 NOTE — Progress Notes (Signed)
Left lower extremity venous duplex has been completed. Negative for DVT. Results were given to [facility] PA.  01/25/18 2:01 PM Carlos Levering RVT

## 2018-01-29 ENCOUNTER — Encounter: Payer: Self-pay | Admitting: Family Medicine

## 2018-01-29 ENCOUNTER — Encounter: Payer: Self-pay | Admitting: Gastroenterology

## 2018-01-29 ENCOUNTER — Ambulatory Visit: Payer: Medicare HMO | Admitting: Family Medicine

## 2018-01-29 VITALS — BP 126/68 | HR 82 | Temp 98.4°F | Ht 61.25 in | Wt 293.5 lb

## 2018-01-29 DIAGNOSIS — R7303 Prediabetes: Secondary | ICD-10-CM | POA: Diagnosis not present

## 2018-01-29 DIAGNOSIS — E2839 Other primary ovarian failure: Secondary | ICD-10-CM

## 2018-01-29 DIAGNOSIS — Z0000 Encounter for general adult medical examination without abnormal findings: Secondary | ICD-10-CM | POA: Diagnosis not present

## 2018-01-29 DIAGNOSIS — Z1211 Encounter for screening for malignant neoplasm of colon: Secondary | ICD-10-CM

## 2018-01-29 DIAGNOSIS — G4733 Obstructive sleep apnea (adult) (pediatric): Secondary | ICD-10-CM | POA: Diagnosis not present

## 2018-01-29 NOTE — Assessment & Plan Note (Signed)
Ref for colonoscopy (parent with colon cancer)  If unable to afford test (ie: has to be done in hospital) will do cologuard instead

## 2018-01-29 NOTE — Progress Notes (Signed)
Subjective:    Patient ID: Barbara Kramer, female    DOB: [birth year], 67 y.o.   MRN: [id number]  HPI Here for welcome to medicare prev exam   I have personally reviewed the Medicare Annual Wellness questionnaire and have noted 1. The patient's medical and social history 2. Their use of alcohol, tobacco or illicit drugs 3. Their current medications and supplements 4. The patient's functional ability including ADL's, fall risks, home safety risks and hearing or visual             impairment. 5. Diet and physical activities 6. Evidence for depression or mood disorders  The patients weight, height, BMI have been recorded in the chart and visual acuity is per eye clinic.  I have made referrals, counseling and provided education to the patient based review of the above and I have provided the pt with a written personalized care plan for preventive services. Reviewed and updated provider list, see scanned forms.  See scanned forms.  Routine anticipatory guidance given to patient.  See health maintenance. Colon cancer screening: colonoscopy 11/08 (5 y recall) parent with colon cancer  IFOB neg 6/11 Breast cancer screening mammogram 10/19  Self breast exam-no lumps  Pap 9/10 - then had partial hysterectomy  Flu vaccine-declines  Tetanus vaccine 2/17  Pneumovax- declines -afraid of shots Zoster vaccine--declines for now  Dexa -interested  Falls - slipped getting out of bed about a year ago-no injuries  Fractures-none  No ca or D currently (ca constipates) Advance directive-has living will (can't remember if she has POA) Cognitive function addressed- see scanned forms- and if abnormal then additional documentation follows. - no problems at all   PMH and SH reviewed  Meds, vitals, and allergies reviewed.   ROS: See HPI.  Otherwise negative.    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[employer]) no hearing c/o   Prediabetes Lab Results  Component Value Date   HGBA1C 6.2 01/22/2018  too much candy   Cholesterol  Lab Results  Component Value Date   CHOL 151 01/22/2018   CHOL 143 10/05/2016   CHOL 142 12/08/2014   Lab Results  Component Value Date   HDL 53.40 01/22/2018   HDL 51.70 10/05/2016   HDL 49.90 12/08/2014   Lab Results  Component Value Date   LDLCALC 83 01/22/2018   LDLCALC 80 10/05/2016   LDLCALC 75 12/08/2014   Lab Results  Component Value Date   TRIG 70.0 01/22/2018   TRIG 60.0 10/05/2016   TRIG 86.0 12/08/2014   Lab Results  Component Value Date   CHOLHDL 3 01/22/2018   CHOLHDL 3 10/05/2016   CHOLHDL 3 12/08/2014   No results found for: LDLDIRECT  Good cholesterol   Rest of labs are in nl range   Patient Active Problem List   Diagnosis Date Noted  . Welcome to  Medicare preventive visit 01/29/2018  . OSA (obstructive sleep apnea) 01/29/2018  . Estrogen deficiency 01/29/2018  . Allergic rhinitis 05/15/2017  . Screening mammogram, encounter for 10/11/2016  . Snoring 10/11/2016  . Prediabetes 12/15/2014  . Osteoarthritis of left knee 10/16/2013  . Left knee pain 10/15/2013  . Colon cancer screening 11/20/2012  . Routine gynecological examination 11/17/2011  . Other screening mammogram 11/17/2011  . Low back pain 10/18/2010  . Routine general medical examination at a health care facility 10/06/2010  . 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 Morbid obesity ([place]) 08/02/2006  . ECZEMA 08/02/2006   Past Medical History:  Diagnosis Date  .  Breast lump    left  . Eczema   . Family hx of colon cancer   . Kidney cysts    small, right 10/2002  . Normal cardiac stress test 9/10   exercise stress test normal but limited by exercise intolerance  . Obesity    Past Surgical History:  Procedure Laterality Date  . CHOLECYSTECTOMY  05/2002   [place]- gallstones, small renal cyst 05/2002  . FOOT SURGERY  11/1998   bunion  . PARTIAL HYSTERECTOMY  1998   fibroids, ovaries remain   Social History   Tobacco Use  . Smoking status: Former Smoker    Last attempt to quit: 03/20/1976    Years since quitting: 41.8  . Smokeless tobacco: Never Used  Substance Use Topics  . Alcohol use: No    Alcohol/week: 0.0 standard drinks  . Drug use: No   Family History  Problem Relation Age of Onset  . Colon cancer Mother   . Stomach cancer Mother   . Lung cancer Father        smoker  . Hypertension Sister   . Stroke Maternal Grandmother   . Stroke Paternal Grandmother   . Hypertension Sister    Allergies  Allergen Reactions  . Calcium Carbonate     REACTION: constipation  . Penicillins Rash    Has patient had a PCN reaction causing immediate rash, facial/tongue/throat swelling, SOB or lightheadedness with hypotension: no Has patient had a PCN reaction causing severe rash involving mucus membranes or skin necrosis: no Has patient had a PCN reaction that required hospitalization: no Has patient had a PCN reaction occurring within the last 10 years: no If all of the above answers are "NO", then may proceed with Cephalosporin use.    Current Outpatient Medications on File Prior to Visit  Medication Sig Dispense Refill  . [DISCONTINUED] famotidine (PEPCID) 20 MG tablet Take 1 tablet (20 mg total) by mouth 2 (two) times daily. 30 tablet 0   No current facility-administered medications on file prior to visit.      Review of Systems  Constitutional: Positive for fatigue. Negative for activity change, appetite change, fever and unexpected weight  change.  HENT: Negative for congestion, ear pain, rhinorrhea, sinus pressure and sore throat.   Eyes: Negative for pain, redness and visual disturbance.  Respiratory: Negative for cough, shortness of breath and wheezing.   Cardiovascular: Negative for chest pain and palpitations.  Gastrointestinal: Negative for abdominal pain, blood in stool, constipation and diarrhea.  Endocrine: Negative for polydipsia and polyuria.  Genitourinary: Negative for dysuria, frequency and urgency.  Musculoskeletal: Negative for arthralgias, back pain and myalgias.  Skin: Negative for pallor and rash.  Allergic/Immunologic: Negative for environmental allergies.  Neurological: Negative for dizziness, syncope and headaches.  Hematological: Negative for adenopathy. Does not bruise/bleed easily.  Psychiatric/Behavioral: Negative for decreased concentration and dysphoric  mood. The patient is not nervous/anxious.        Objective:   Physical Exam  Constitutional: She appears well-developed and well-nourished. No distress.  obese and well appearing   HENT:  Head: Normocephalic and atraumatic.  Right Ear: External ear normal.  Left Ear: External ear normal.  Mouth/Throat: Oropharynx is clear and moist.  Eyes: Pupils are equal, round, and reactive to light. Conjunctivae and EOM are normal. No scleral icterus.  Neck: Normal range of motion. Neck supple. No JVD present. Carotid bruit is not present. No thyromegaly present.  Cardiovascular: Normal rate, regular rhythm, normal heart sounds and intact distal pulses. Exam reveals no gallop.  Pulmonary/Chest: Effort normal and breath sounds normal. No respiratory distress. She has no wheezes. She has no rales. She exhibits no tenderness. No breast tenderness, discharge or bleeding.  Abdominal: Soft. Bowel sounds are normal. She exhibits no distension, no abdominal bruit and no mass. There is no tenderness.  Genitourinary: No breast tenderness, discharge or bleeding.    Genitourinary Comments: Breast exam: No mass, nodules, thickening, tenderness, bulging, retraction, inflamation, nipple discharge or skin changes noted.  No axillary or clavicular LA.      Musculoskeletal: Normal range of motion. She exhibits no edema or tenderness.  Lymphadenopathy:    She has no cervical adenopathy.  Neurological: She is alert. She has normal reflexes. She displays normal reflexes. No cranial nerve deficit. She exhibits normal muscle tone. Coordination normal.  Skin: Skin is warm and dry. No rash noted. No erythema. No pallor.  Some skin tags Few sk and lentigines   Psychiatric: She has a normal mood and affect.  Pleasant           Assessment & Plan:   Problem List Items Addressed This Visit      Respiratory   OSA (obstructive sleep apnea)    Pt used cpap for a month-could not afford to rent the unit Looking to buy one on line when able and get back to it It did help her symptoms  Wt loss enc        Other   Colon cancer screening    Ref for colonoscopy (parent with colon cancer)  If unable to afford test (ie: has to be done in hospital) will do cologuard instead       Relevant Orders   Ambulatory referral to Gastroenterology   Estrogen deficiency    Ref for screening dexa       Relevant Orders   DG Bone Density   Morbid obesity ([place])    Discussed how this problem influences overall health and the risks it imposes  Reviewed plan for weight loss with lower calorie diet (via better food choices and also portion control or program like weight watchers) and exercise building up to or more than 30 minutes 5 days per week including some aerobic activity         Prediabetes    Lab Results  Component Value Date   HGBA1C 6.2 01/22/2018   disc imp of low glycemic diet and wt loss to prevent DM2       Welcome to Medicare preventive visit - Primary    Reviewed health habits including diet and exercise and skin cancer prevention Reviewed appropriate  screening tests for age  Also reviewed health mt list, fam hx and immunization status , as well as social and family history   Labs reviewed  Unfortunately-pt declines vaccines (flu /pna/shingles)  Ref for colonoscopy (if not affordable will ref  for cologuard) Ref for dexa  Disc imp of vit D supplementation Disc diet/exercise for wt loss and DM prev

## 2018-01-29 NOTE — Assessment & Plan Note (Signed)
Discussed how this problem influences overall health and the risks it imposes  Reviewed plan for weight loss with lower calorie diet (via better food choices and also portion control or program like weight watchers) and exercise building up to or more than 30 minutes 5 days per week including some aerobic activity    

## 2018-01-29 NOTE — Assessment & Plan Note (Signed)
Pt used cpap for a month-could not afford to rent the unit Looking to buy one on line when able and get back to it It did help her symptoms  Wt loss enc

## 2018-01-29 NOTE — Assessment & Plan Note (Signed)
Lab Results  Component Value Date   HGBA1C 6.2 01/22/2018   disc imp of low glycemic diet and wt loss to prevent DM2

## 2018-01-29 NOTE — Patient Instructions (Addendum)
Flu shots and pneumonia vaccines are very important  Let me know when you are ready to start   If you are interested in the new shingles vaccine (Shingrix) - call your local pharmacy to check on coverage and availability  If affordable, get on a wait list at your pharmacy to get the vaccine.  Take vitamin D 2000 iu over the counter (vitamin D3) every day   If /when you have a living will and power of attorney bring it in we will scan for chart (the blue packet is helpful if needed)   Get started with exercise - goal is to work up to 30 or more minutes 5 d per week   Goal to prevent diabetes Try to get most of your carbohydrates from produce (with the exception of white potatoes)  Eat less bread/pasta/rice/snack foods/cereals/sweets and other items from the middle of the grocery store (processed carbs)  Please try to get off sodas!

## 2018-01-29 NOTE — Assessment & Plan Note (Signed)
Ref for screening dexa  

## 2018-01-29 NOTE — Assessment & Plan Note (Signed)
Reviewed health habits including diet and exercise and skin cancer prevention Reviewed appropriate screening tests for age  Also reviewed health mt list, fam hx and immunization status , as well as social and family history   Labs reviewed  Unfortunately-pt declines vaccines (flu /pna/shingles)  Ref for colonoscopy (if not affordable will ref for cologuard) Ref for dexa  Disc imp of vit D supplementation Disc diet/exercise for wt loss and DM prev

## 2018-01-30 ENCOUNTER — Telehealth: Payer: Self-pay | Admitting: Family Medicine

## 2018-01-30 NOTE — Telephone Encounter (Signed)
Left message asking pt to call office Regarding bone density

## 2018-02-04 ENCOUNTER — Telehealth: Payer: Self-pay | Admitting: Family Medicine

## 2018-02-04 NOTE — Telephone Encounter (Signed)
Thanks for letting me know!

## 2018-02-04 NOTE — Telephone Encounter (Signed)
FYI Pt called to schedule her Flu shots and pneumonia vaccines 12/5.

## 2018-02-15 DIAGNOSIS — Z0100 Encounter for examination of eyes and vision without abnormal findings: Secondary | ICD-10-CM | POA: Diagnosis not present

## 2018-02-21 ENCOUNTER — Ambulatory Visit (INDEPENDENT_AMBULATORY_CARE_PROVIDER_SITE_OTHER): Payer: Medicare HMO | Admitting: *Deleted

## 2018-02-21 DIAGNOSIS — Z23 Encounter for immunization: Secondary | ICD-10-CM | POA: Diagnosis not present

## 2018-02-28 ENCOUNTER — Ambulatory Visit: Payer: Medicare HMO | Admitting: Gastroenterology

## 2018-04-04 ENCOUNTER — Ambulatory Visit: Payer: Medicare HMO | Admitting: Gastroenterology

## 2018-04-08 IMAGING — US US EXTREM LOW VENOUS*L*
1 series · 13 of 24 positions shown · non-contrast
Comparison: None.

CLINICAL DATA: Left calf pain following twisting injury 2 weeks
ago. Evaluate for DVT.



[Series 1: us extrem low venous*left* · 0.08mm/px · 13 of 34 slices shown]
[im 1/34]
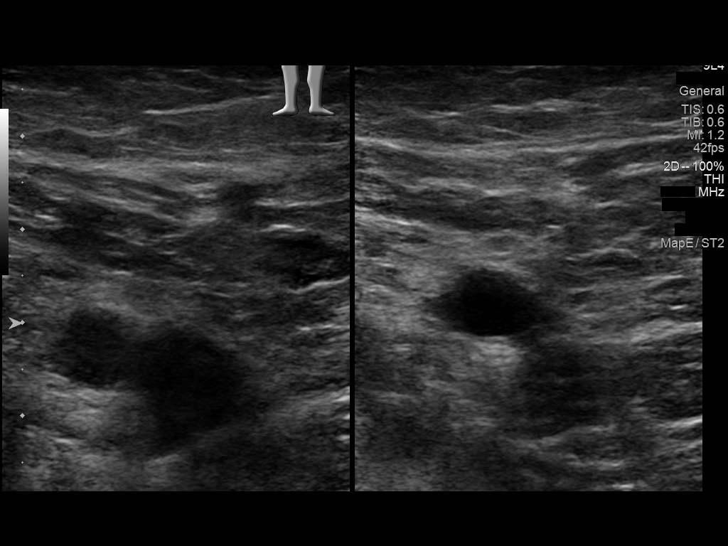
[im 3/34]
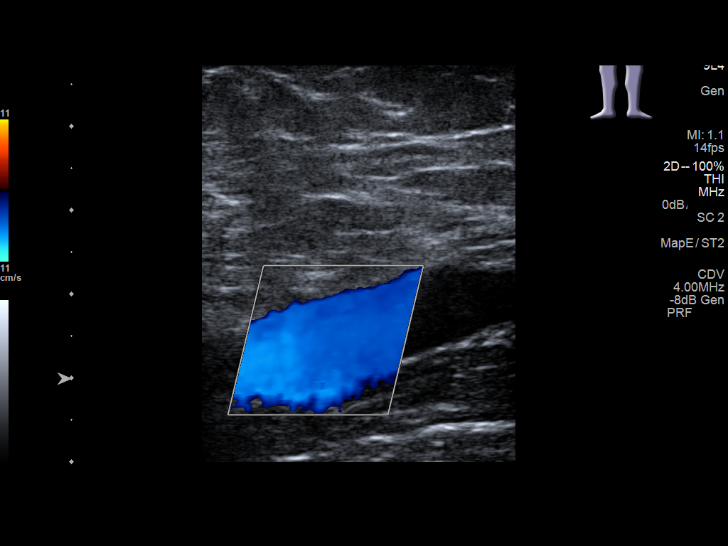
[im 6/34]
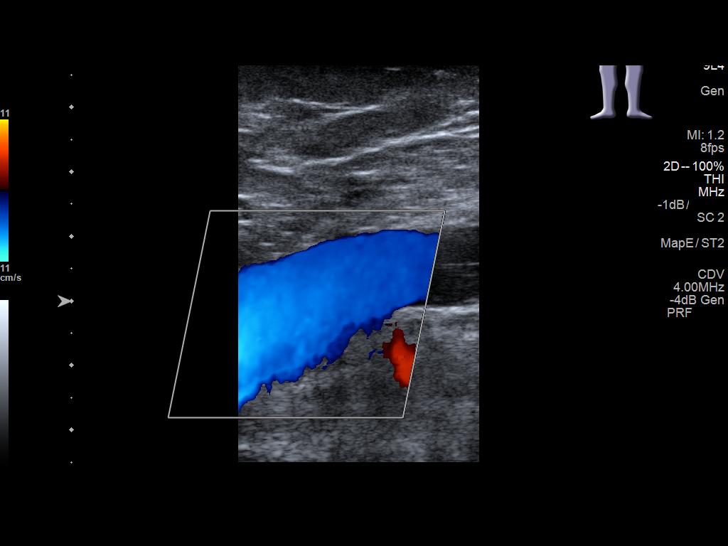
[im 9/34]
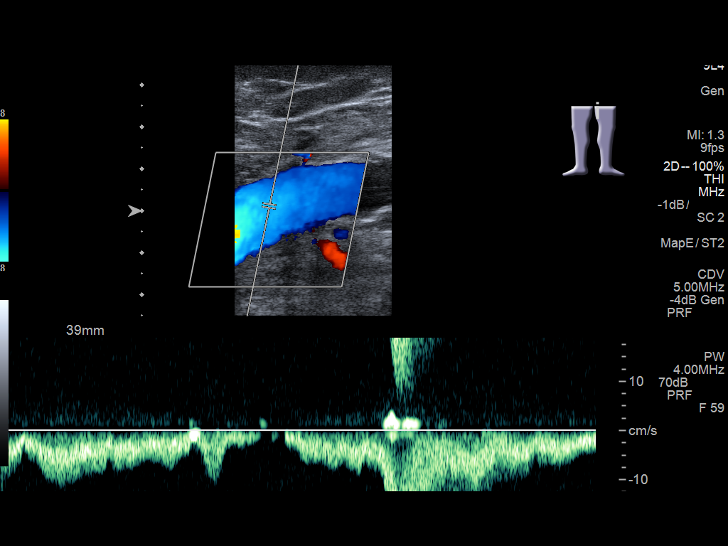
[im 12/34]
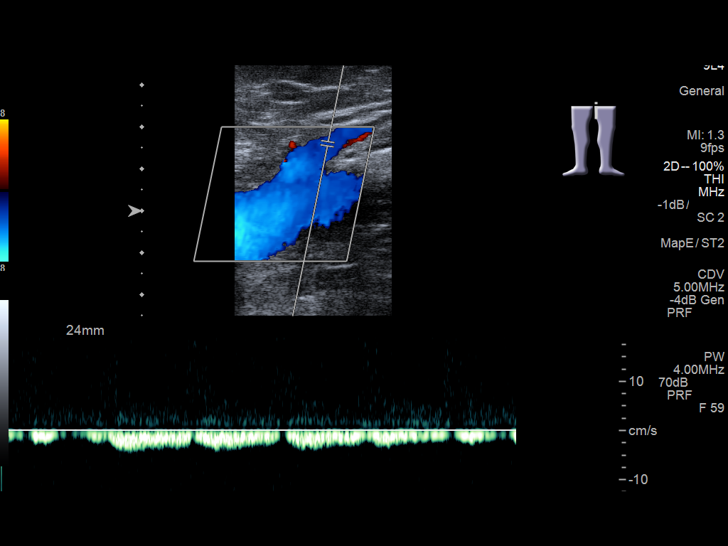
[im 15/34]
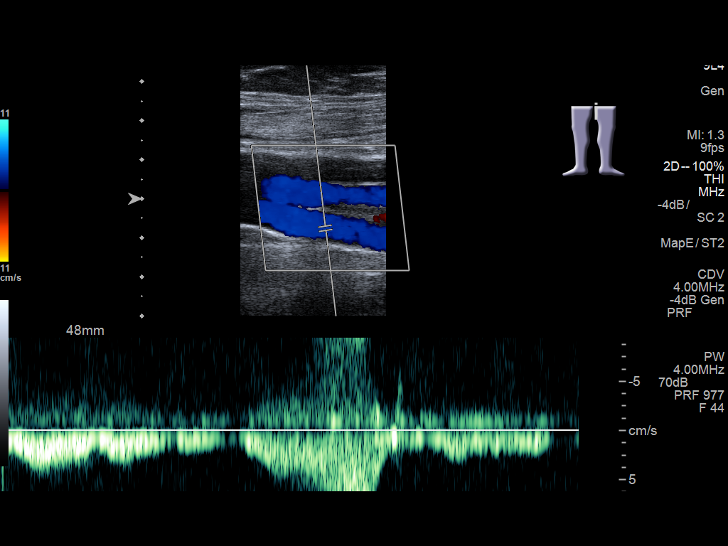
[im 18/34]
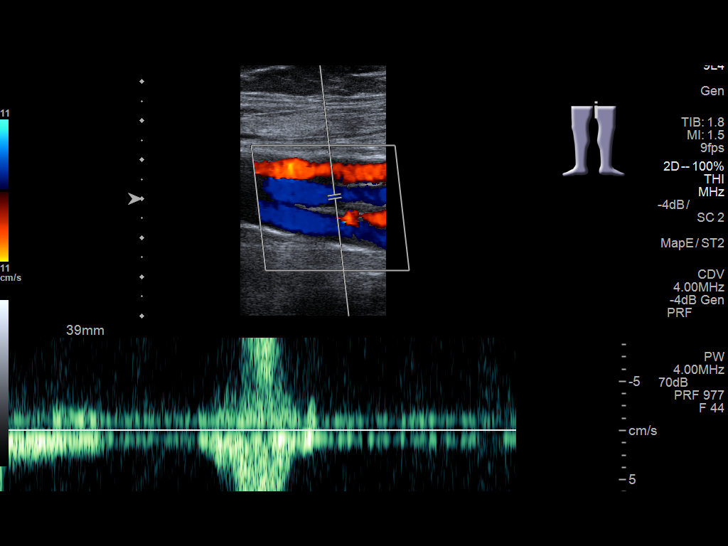
[im 19/34]
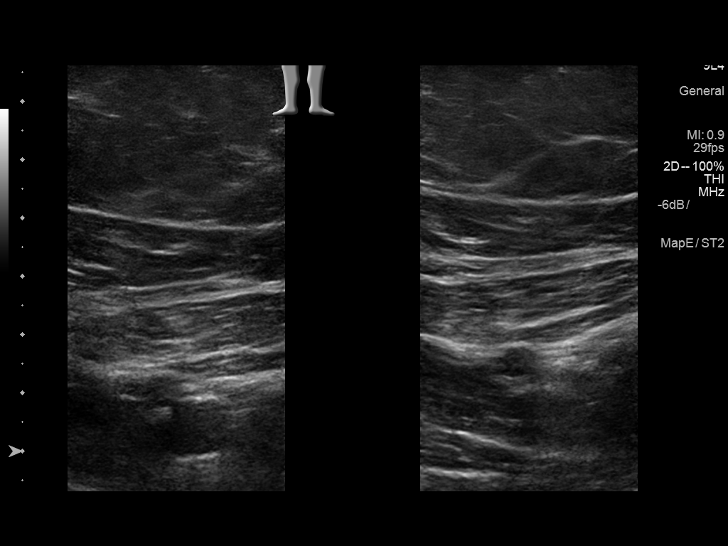
[im 22/34]
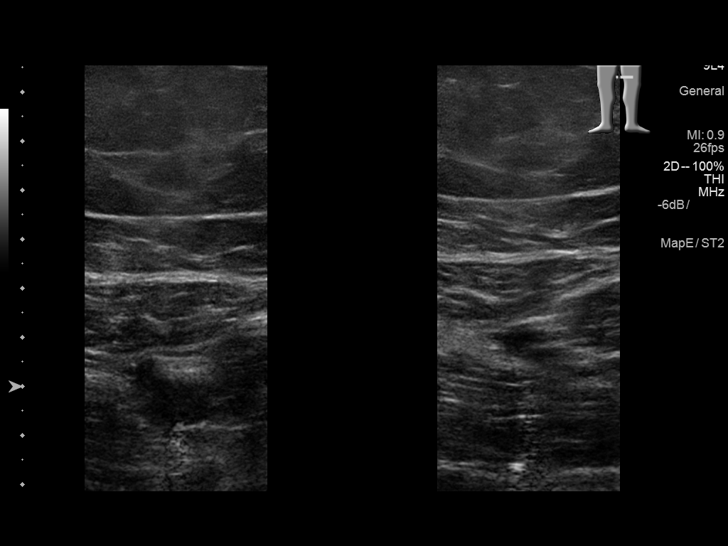
[im 25/34]
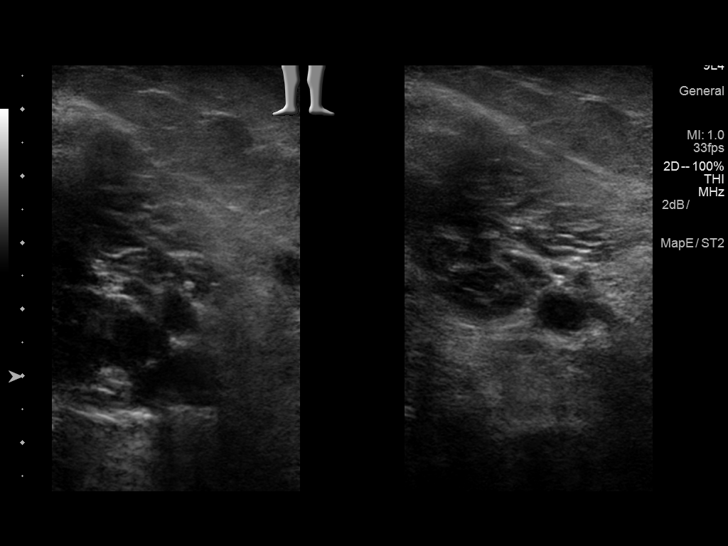
[im 28/34]
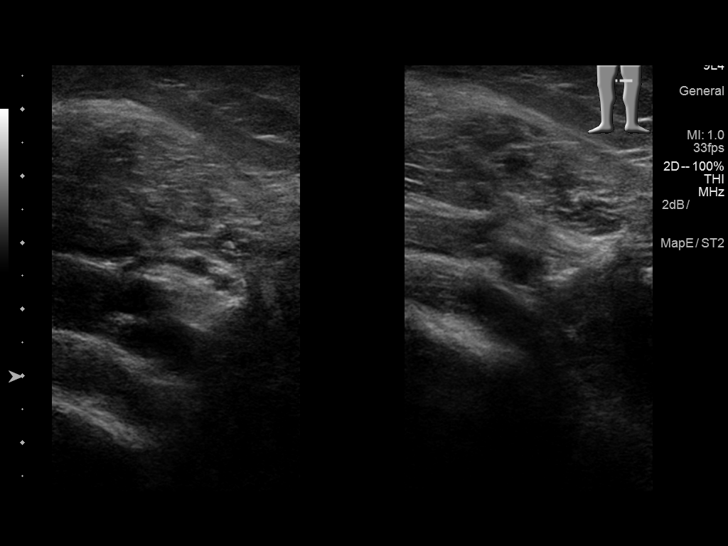
[im 31/34]
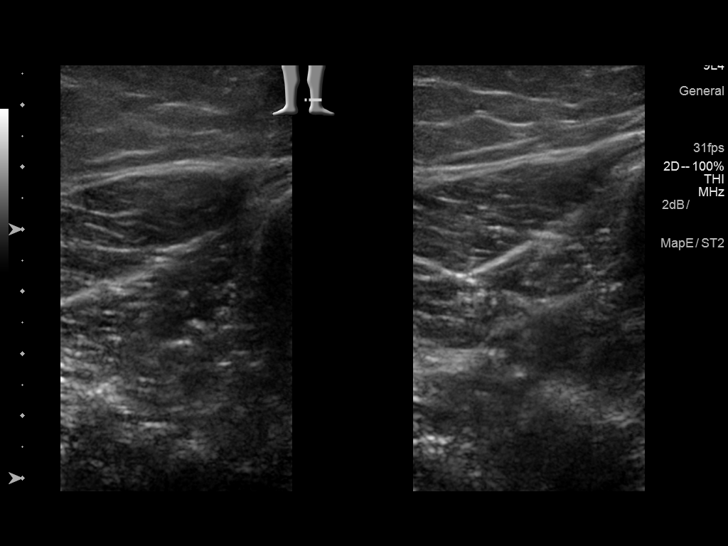
[im 34/34]
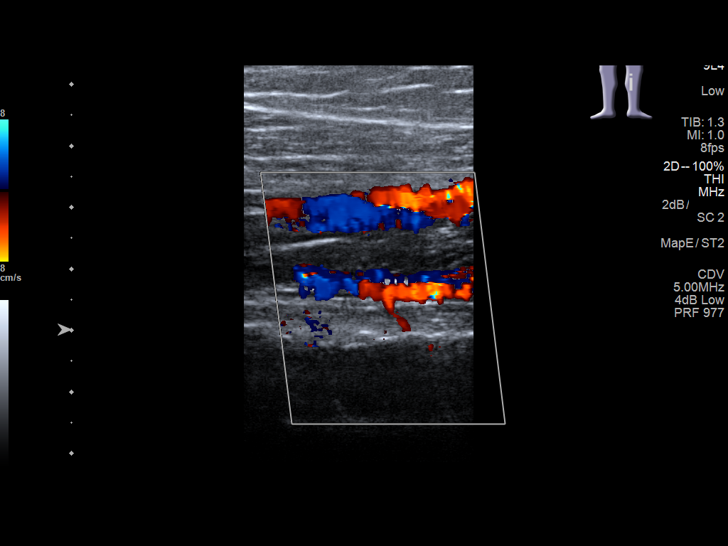

[13 of 24 positions shown; findings below may reference images not displayed]

FINDINGS: Contralateral Common Femoral Vein: Respiratory phasicity is normal
and symmetric with the symptomatic side. No evidence of thrombus.
Normal compressibility.

Common Femoral Vein: No evidence of thrombus. Normal
compressibility, respiratory phasicity and response to augmentation.

Saphenofemoral Junction: No evidence of thrombus. Normal
compressibility and flow on color Doppler imaging.

Profunda Femoral Vein: No evidence of thrombus. Normal
compressibility and flow on color Doppler imaging.

Femoral Vein: No evidence of thrombus. Normal compressibility,
respiratory phasicity and response to augmentation.

Popliteal Vein: No evidence of thrombus. Normal compressibility,
respiratory phasicity and response to augmentation.

Calf Veins: No evidence of thrombus. Normal compressibility and flow
on color Doppler imaging.

Superficial Great Saphenous Vein: No evidence of thrombus. Normal
compressibility and flow on color Doppler imaging.

Venous Reflux:  None.

Other Findings:  None.
IMPRESSION: No evidence of DVT within the left lower extremity.

## 2018-04-12 ENCOUNTER — Encounter: Payer: Self-pay | Admitting: Family Medicine

## 2018-04-12 ENCOUNTER — Ambulatory Visit (INDEPENDENT_AMBULATORY_CARE_PROVIDER_SITE_OTHER): Payer: Medicare HMO | Admitting: Family Medicine

## 2018-04-12 VITALS — BP 134/70 | HR 69 | Temp 98.3°F | Ht 61.25 in | Wt 298.2 lb

## 2018-04-12 DIAGNOSIS — H1033 Unspecified acute conjunctivitis, bilateral: Secondary | ICD-10-CM

## 2018-04-12 MED ORDER — POLYMYXIN B-TRIMETHOPRIM 10000-0.1 UNIT/ML-% OP SOLN: 1.0000 [drp] | Freq: Four times a day (QID) | OPHTHALMIC | 0 refills | Status: DC

## 2018-04-12 NOTE — Progress Notes (Signed)
Subjective:    Patient ID: Barbara Kramer, female    DOB: 03/07/1951, 68 y.o.   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 Acute conjunctivitis of both eyes 04/12/2018  . Welcome to Medicare preventive visit 01/29/2018  . OSA (obstructive sleep apnea) 01/29/2018  . Estrogen deficiency 01/29/2018  . Allergic rhinitis 05/15/2017  . Screening mammogram, encounter for 10/11/2016  . Snoring 10/11/2016  . Prediabetes 12/15/2014  . Osteoarthritis of left knee 10/16/2013  . Left knee pain 10/15/2013  . Colon cancer screening 11/20/2012  . Routine gynecological examination 11/17/2011  . Other screening mammogram 11/17/2011  . Low back pain 10/18/2010  . Routine general medical examination at a health care facility 10/06/2010  . 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 Morbid obesity ([place]) 08/02/2006  . ECZEMA 08/02/2006   Past Medical History:  Diagnosis Date  . Breast lump    left  . Eczema   . Family hx of colon cancer   . Kidney cysts    small, right 10/2002  . Normal cardiac stress test 9/10   exercise stress test normal but limited by exercise intolerance  . Obesity    Past Surgical History:  Procedure Laterality Date  . CHOLECYSTECTOMY  05/2002   [place]- gallstones, small renal cyst 05/2002  . FOOT SURGERY  11/1998   bunion  . PARTIAL HYSTERECTOMY  1998   fibroids, ovaries remain   Social History    Tobacco Use  . Smoking status: Former Smoker    Last attempt to quit: 03/20/1976    Years since quitting: 42.0  . Smokeless tobacco: Never Used  Substance Use Topics  . Alcohol use: No    Alcohol/week: 0.0 standard drinks  . Drug use: No   Family History  Problem Relation Age of Onset  . Colon cancer Mother   . Stomach cancer Mother   . Lung cancer Father        smoker  . Hypertension Sister   . Stroke Maternal Grandmother   . Stroke Paternal Grandmother   . Hypertension Sister    Allergies  Allergen Reactions  . Calcium Carbonate     REACTION: constipation  . Penicillins Rash    Has patient had a PCN reaction causing immediate rash, facial/tongue/throat swelling, SOB or lightheadedness with hypotension: no Has patient had a PCN reaction causing severe rash involving mucus membranes or skin necrosis: no Has patient had a PCN reaction that required hospitalization: no Has patient had a PCN reaction occurring within the last 10 years: no If all of the above answers are "NO", then may proceed with Cephalosporin use.    Current Outpatient Medications on File Prior to Visit  Medication Sig Dispense Refill  . [DISCONTINUED] famotidine (PEPCID) 20  MG tablet Take 1 tablet (20 mg total) by mouth 2 (two) times daily. 30 tablet 0   No current facility-administered medications on file prior to visit.     Review of Systems  Constitutional: Negative for activity change, appetite change, fatigue, fever and unexpected weight change.  HENT: Positive for postnasal drip and rhinorrhea. Negative for congestion, ear pain, sinus pressure and sore throat.   Eyes: Positive for discharge, redness and itching. Negative for photophobia, pain and visual disturbance.  Respiratory: Negative for cough, shortness of breath and wheezing.   Cardiovascular: Negative for chest pain and palpitations.  Gastrointestinal: Negative for abdominal pain, blood in stool, constipation and diarrhea.  Endocrine:  Negative for polydipsia and polyuria.  Genitourinary: Negative for dysuria, frequency and urgency.  Musculoskeletal: Negative for arthralgias, back pain and myalgias.  Skin: Negative for pallor and rash.  Allergic/Immunologic: Negative for environmental allergies.  Neurological: Negative for dizziness, syncope and headaches.  Hematological: Negative for adenopathy. Does not bruise/bleed easily.  Psychiatric/Behavioral: Negative for decreased concentration and dysphoric mood. The patient is not nervous/anxious.        Objective:   Physical Exam Constitutional:      General: She is not in acute distress.    Appearance: Normal appearance. She is obese. She is not ill-appearing.  HENT:     Head: Normocephalic and atraumatic.     Right Ear: Tympanic membrane and ear canal normal.     Left Ear: Tympanic membrane and ear canal normal.     Nose: Rhinorrhea present.     Mouth/Throat:     Mouth: Mucous membranes are moist.     Pharynx: Oropharynx is clear. No oropharyngeal exudate or posterior oropharyngeal erythema.  Eyes:     General:        Right eye: Discharge present.        Left eye: Discharge present.    Extraocular Movements: Extraocular movements intact.     Pupils: Pupils are equal, round, and reactive to light.     Comments: Conjunctival injection  Clear to cloudy d/c No fb seen in either eye  No signs of injury  Grossly nl vision   Cardiovascular:     Rate and Rhythm: Normal rate and regular rhythm.     Pulses: Normal pulses.  Pulmonary:     Effort: Pulmonary effort is normal. No respiratory distress.     Breath sounds: Normal breath sounds. No wheezing.  Skin:    General: Skin is warm and dry.     Findings: No erythema or rash.  Neurological:     General: No focal deficit present.     Mental Status: She is alert.     Cranial Nerves: No cranial nerve deficit.  Psychiatric:        Mood and Affect: Mood normal.           Assessment & Plan:   Problem List  Items Addressed This Visit      Other   Morbid obesity ([place])    Enc healthy diet and exercise       Acute conjunctivitis of both eyes - Primary    Mild - with exp to bacterial conjunctivitis and self tx with a day of abx drops  Cover with polytrim Disc poss of viral due to also having uri symptoms  Disc symptomatic care - see instructions on AVS  Disc hygiene - washing linens and hands  Avoid touching surfaces Use of warm or cool washcloth for d/c or crusting

## 2018-04-12 NOTE — Patient Instructions (Signed)
Wash hands frequently  Try not to touch or rub eyes Use drops as px until better  Wash linens daily until better (towel and pillow case)  Update if not starting to improve in a week or if worsening    A cool compress on closed eyes can help irritation and matting in the morning   For cold symptoms - get rest/fluids and treat symptoms

## 2018-04-14 NOTE — Assessment & Plan Note (Signed)
Enc healthy diet and exercise

## 2018-04-14 NOTE — Assessment & Plan Note (Signed)
Mild - with exp to bacterial conjunctivitis and self tx with a day of abx drops  Cover with polytrim Disc poss of viral due to also having uri symptoms  Disc symptomatic care - see instructions on AVS  Disc hygiene - washing linens and hands  Avoid touching surfaces Use of warm or cool washcloth for d/c or crusting

## 2018-04-17 ENCOUNTER — Other Ambulatory Visit: Payer: Medicare HMO

## 2018-04-25 ENCOUNTER — Ambulatory Visit: Payer: Medicare HMO | Admitting: Family Medicine

## 2018-05-27 ENCOUNTER — Ambulatory Visit (INDEPENDENT_AMBULATORY_CARE_PROVIDER_SITE_OTHER): Payer: Medicare HMO | Admitting: Family Medicine

## 2018-05-27 ENCOUNTER — Encounter: Payer: Self-pay | Admitting: Family Medicine

## 2018-05-27 VITALS — BP 132/74 | HR 73 | Temp 98.1°F | Ht 61.25 in | Wt 295.1 lb

## 2018-05-27 DIAGNOSIS — L308 Other specified dermatitis: Secondary | ICD-10-CM | POA: Diagnosis not present

## 2018-05-27 MED ORDER — TRIAMCINOLONE ACETONIDE 0.1 % EX CREA: 1.0000 "application " | TOPICAL_CREAM | Freq: Two times a day (BID) | CUTANEOUS | 1 refills | Status: DC

## 2018-05-27 NOTE — Patient Instructions (Signed)
Change to dove soap for sensitive skin  Change detergent to any "free" brand  Also stop the dryer sheets   Avoid hot water   Use a scent free moisturizer  For night time/ at home I like Eucerin (store brand) - no scent  For work - get one that is not quite as thick (like lubriderm)  Anything without color or fragrance   Avoid perfumes   Use the triamcinolone cream on the spots of dryness

## 2018-05-27 NOTE — Progress Notes (Signed)
Subjective:    Patient ID: Barbara Kramer, female    DOB: [birth year], 68 y.o.   MRN: [id number]  HPI Here for itching all over and spot on her face /hands   Splotches  Dry   She also has intermittent itching - arms  Usually late in evening (after 6/after she gets off work)  occ whelps come up   [employer] from Last 3 Encounters:  05/27/18 295 lb 2 oz (133.9 kg)  04/12/18 298 lb 4 oz (135.3 kg)  01/29/18 293 lb 8 oz (133.1 kg)   55.31 kg/m  Soap- different types / dove Delight Stare /safeguard Detergent - gain  Uses dryer sheets   Moisturizers - usually uses gold bond (is thick) occ vaseline  Usually helps the spots   Has to use school soap for hands and also hand sanitizer   Patient Active Problem List   Diagnosis Date Noted  . Acute conjunctivitis of both eyes 04/12/2018  . Welcome to Medicare preventive visit 01/29/2018  . OSA (obstructive sleep apnea) 01/29/2018  . Estrogen deficiency 01/29/2018  . Allergic rhinitis 05/15/2017  . Screening mammogram, encounter for 10/11/2016  . Snoring 10/11/2016  . Prediabetes 12/15/2014  . Osteoarthritis of left knee 10/16/2013  . Left knee pain 10/15/2013  . Colon cancer screening 11/20/2012  . Routine gynecological examination 11/17/2011  . Other screening mammogram 11/17/2011  . Low back pain 10/18/2010  . Routine general medical examination at a health care facility 10/06/2010  . 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 Morbid obesity ([place]) 08/02/2006  . Eczema 08/02/2006   Past Medical History:  Diagnosis Date  . Breast lump    left  . Eczema   . Family hx of colon cancer   . Kidney cysts    small, right 10/2002  . Normal cardiac stress test 9/10   exercise stress test normal but limited by exercise intolerance  . Obesity    Past Surgical History:  Procedure Laterality Date  . CHOLECYSTECTOMY  05/2002   [place]- gallstones, small renal cyst 05/2002  . FOOT SURGERY  11/1998   bunion  . PARTIAL HYSTERECTOMY  1998   fibroids, ovaries remain   Social History   Tobacco Use  . Smoking status: Former Smoker    Last attempt to quit: 03/20/1976    Years since quitting: 42.2  . Smokeless tobacco: Never Used  Substance Use Topics  . Alcohol use: No    Alcohol/week: 0.0 standard drinks  . Drug use: No   Family History  Problem Relation Age of Onset  . Colon cancer Mother   . Stomach cancer Mother   . Lung cancer Father        smoker  . Hypertension Sister   . Stroke Maternal Grandmother   . Stroke Paternal Grandmother   . Hypertension Sister    Allergies  Allergen Reactions  . Calcium Carbonate     REACTION: constipation  . Penicillins Rash    Has patient had a PCN reaction causing immediate rash, facial/tongue/throat swelling, SOB or lightheadedness with hypotension: no Has patient had a PCN reaction causing severe rash involving mucus membranes or skin necrosis: no Has patient had a PCN reaction that required hospitalization: no Has patient had a PCN reaction occurring within the last 10 years: no If all of the above answers are "NO", then may proceed with Cephalosporin use.    Current Outpatient Medications on File Prior to Visit  Medication Sig Dispense Refill  . trimethoprim-polymyxin b (POLYTRIM) ophthalmic solution  Place 1 drop into both eyes every 6 (six) hours. While awake 10 mL 0  . [DISCONTINUED] famotidine (PEPCID) 20 MG tablet Take 1 tablet (20 mg total) by mouth 2 (two) times daily. 30 tablet 0   No current facility-administered medications on file prior to visit.      Review of Systems  Constitutional: Negative for activity change, appetite change, fatigue, fever and unexpected weight change.  HENT: Negative for congestion, ear pain, rhinorrhea, sinus pressure and sore throat.   Eyes: Negative for pain, redness and visual disturbance.  Respiratory: Negative for cough, shortness of breath and wheezing.   Cardiovascular: Negative for chest pain and palpitations.    Gastrointestinal: Negative for abdominal pain, blood in stool, constipation and diarrhea.  Endocrine: Negative for polydipsia and polyuria.  Genitourinary: Negative for dysuria, frequency and urgency.  Musculoskeletal: Negative for arthralgias, back pain and myalgias.  Skin: Positive for rash. Negative for pallor and wound.  Allergic/Immunologic: Negative for environmental allergies.  Neurological: Negative for dizziness, syncope and headaches.  Hematological: Negative for adenopathy. Does not bruise/bleed easily.  Psychiatric/Behavioral: Negative for decreased concentration and dysphoric mood. The patient is not nervous/anxious.        Objective:   Physical Exam Constitutional:      General: She is not in acute distress.    Appearance: Normal appearance. She is obese. She is not ill-appearing.  HENT:     Head: Normocephalic and atraumatic.     Mouth/Throat:     Mouth: Mucous membranes are moist.     Pharynx: Oropharynx is clear.  Eyes:     General:        Right eye: No discharge.        Left eye: No discharge.     Conjunctiva/sclera: Conjunctivae normal.     Pupils: Pupils are equal, round, and reactive to light.  Neck:     Musculoskeletal: Normal range of motion.  Cardiovascular:     Rate and Rhythm: Normal rate and regular rhythm.  Pulmonary:     Effort: Pulmonary effort is normal. No respiratory distress.     Breath sounds: Normal breath sounds. No wheezing or rales.  Lymphadenopathy:     Cervical: No cervical adenopathy.  Skin:    General: Skin is warm and dry.     Findings: Rash present.     Comments: Some areas of hyperpigmentation on face/cheeks (stable) Spots of dryness on R temple/cheek/upper forehead and under nose on the L  No crust or drainage  Similar dry areas with scale on dorsal hands over and around MCP joints  Some dry skin on palms   No rash on arms but dry skin noted and some excoriation marks on L upper arm   Neurological:     Mental Status:  She is alert.     Cranial Nerves: No cranial nerve deficit.  Psychiatric:        Mood and Affect: Mood normal.           Assessment & Plan:   Problem List Items Addressed This Visit      Musculoskeletal and Integument   Eczema - Primary    Areas on face and hands  Dry skin and itching on arms  Disc product changes to make / dove soap for sens skin/moisturizers w/o color or fragrance/ stop fab softener/ change to free detergent  Avoid hot water  Triamcinolone px for dry areas prn  Avoid hot water Update if not starting to improve in a week or if  worsening

## 2018-05-27 NOTE — Assessment & Plan Note (Signed)
Areas on face and hands  Dry skin and itching on arms  Disc product changes to make / dove soap for sens skin/moisturizers w/o color or fragrance/ stop fab softener/ change to free detergent  Avoid hot water  Triamcinolone px for dry areas prn  Avoid hot water Update if not starting to improve in a week or if worsening

## 2018-09-09 ENCOUNTER — Telehealth: Payer: Self-pay

## 2018-09-09 NOTE — Telephone Encounter (Signed)
Pt calls with dizziness,last dizziness was few seconds last night; difficult for pt to describe if room spins or just light headed; pt is at work and cannot ck BP.last BP ck was sometime in March 2020.no dizziness now; lt leg feels like wants to "give out" in the lt knee; pt has arthritis; pt walked the track yesterday and today knee is not hurting this morning.few wks ago  heart palpitations at night x 1 stopped caffeine and no heart issues since stopped caffeine. Pt wants to know if she can get a stand up walker to help her walk;pt thinks will help leg and back. No covid symptoms,no travel and no known exposure to covid.pt wanted to wait until Dr Marliss Coots return to see her. Pt scheduled 30' appt 09/16/18 at 10 AM. If condition worsens prior to appt pt will cb for sooner appt. FYI to Dr Glori Bickers.

## 2018-09-09 NOTE — Telephone Encounter (Signed)
I will see her then If symptoms suddenly worsen - go to ED Thanks

## 2018-09-16 ENCOUNTER — Ambulatory Visit: Payer: Medicare HMO | Admitting: Family Medicine

## 2019-03-10 ENCOUNTER — Telehealth: Payer: Self-pay | Admitting: Family Medicine

## 2019-03-10 DIAGNOSIS — R7303 Prediabetes: Secondary | ICD-10-CM

## 2019-03-10 DIAGNOSIS — Z0000 Encounter for general adult medical examination without abnormal findings: Secondary | ICD-10-CM

## 2019-03-10 NOTE — Telephone Encounter (Signed)
-----   Message from Ellamae Sia sent at 03/05/2019 10:09 AM EST ----- Regarding: lab orders for Tuesday, 12.22.20 Patient is scheduled for CPX labs, please order future labs, Thanks , Karna Christmas

## 2019-03-11 ENCOUNTER — Other Ambulatory Visit: Payer: Self-pay

## 2019-03-11 ENCOUNTER — Ambulatory Visit (INDEPENDENT_AMBULATORY_CARE_PROVIDER_SITE_OTHER): Payer: Medicare HMO

## 2019-03-11 ENCOUNTER — Other Ambulatory Visit (INDEPENDENT_AMBULATORY_CARE_PROVIDER_SITE_OTHER): Payer: Medicare HMO

## 2019-03-11 DIAGNOSIS — R7303 Prediabetes: Secondary | ICD-10-CM | POA: Diagnosis not present

## 2019-03-11 DIAGNOSIS — Z0000 Encounter for general adult medical examination without abnormal findings: Secondary | ICD-10-CM | POA: Diagnosis not present

## 2019-03-11 LAB — CBC WITH DIFFERENTIAL/PLATELET
Basophils Absolute: 0 10*3/uL (ref 0.0–0.1)
Basophils Relative: 0.5 % (ref 0.0–3.0)
Eosinophils Absolute: 0.2 10*3/uL (ref 0.0–0.7)
Eosinophils Relative: 2 % (ref 0.0–5.0)
HCT: 40.5 % (ref 36.0–46.0)
Hemoglobin: 12.7 g/dL (ref 12.0–15.0)
Lymphocytes Relative: 37.5 % (ref 12.0–46.0)
Lymphs Abs: 3.3 10*3/uL (ref 0.7–4.0)
MCHC: 31.5 g/dL (ref 30.0–36.0)
MCV: 88.3 fl (ref 78.0–100.0)
Monocytes Absolute: 0.5 10*3/uL (ref 0.1–1.0)
Monocytes Relative: 5.9 % (ref 3.0–12.0)
Neutro Abs: 4.8 10*3/uL (ref 1.4–7.7)
Neutrophils Relative %: 54.1 % (ref 43.0–77.0)
Platelets: 281 10*3/uL (ref 150.0–400.0)
RBC: 4.59 Mil/uL (ref 3.87–5.11)
RDW: 15.4 % (ref 11.5–15.5)
WBC: 8.8 10*3/uL (ref 4.0–10.5)

## 2019-03-11 LAB — COMPREHENSIVE METABOLIC PANEL
ALT: 12 U/L (ref 0–35)
AST: 18 U/L (ref 0–37)
Albumin: 4.1 g/dL (ref 3.5–5.2)
Alkaline Phosphatase: 66 U/L (ref 39–117)
BUN: 12 mg/dL (ref 6–23)
CO2: 26 mEq/L (ref 19–32)
Calcium: 9.8 mg/dL (ref 8.4–10.5)
Chloride: 105 mEq/L (ref 96–112)
Creatinine, Ser: 0.99 mg/dL (ref 0.40–1.20)
GFR: 67.86 mL/min (ref >60.00)
Glucose, Bld: 96 mg/dL (ref 70–99)
Potassium: 4 mEq/L (ref 3.5–5.1)
Sodium: 138 mEq/L (ref 135–145)
Total Bilirubin: 0.4 mg/dL (ref 0.2–1.2)
Total Protein: 7.4 g/dL (ref 6.0–8.3)

## 2019-03-11 LAB — LIPID PANEL
Cholesterol: 143 mg/dL (ref 0–200)
HDL: 53 mg/dL (ref >39.00)
LDL Cholesterol: 80 mg/dL (ref 0–99)
NonHDL: 89.54
Total CHOL/HDL Ratio: 3
Triglycerides: 49 mg/dL (ref 0.0–149.0)
VLDL: 9.8 mg/dL (ref 0.0–40.0)

## 2019-03-11 LAB — TSH: TSH: 3.02 u[IU]/mL (ref 0.35–4.50)

## 2019-03-11 LAB — HEMOGLOBIN A1C: Hgb A1c MFr Bld: 6.1 % (ref 4.6–6.5)

## 2019-03-11 NOTE — Progress Notes (Signed)
Subjective:   Barbara Kramer is a 68 y.o. female who presents for Medicare Annual (Subsequent) preventive examination.  Review of Systems: N/A   This visit is being conducted through telemedicine via telephone at the nurse health advisor's home address due to the COVID-19 pandemic. This patient has given me verbal consent via doximity to conduct this visit, patient states they are participating from their home address. Patient and myself are on the telephone call. There is no referral for this visit. Some vital signs may be absent or patient reported.    Patient identification: identified by name, DOB, and current address   Cardiac Risk Factors include: advanced age (>38men, >84 women);Other (see comment), Risk factor comments: hyperglycemia     Objective:     Vitals: There were no vitals taken for this visit.  There is no height or weight on file to calculate BMI.  Advanced Directives 03/11/2019 01/25/2018  Does Patient Have a Medical Advance Directive? No No  Does patient want to make changes to medical advance directive? Yes (MAU/Ambulatory/Procedural Areas - Information given) -  Would patient like information on creating a medical advance directive? - No - Patient declined    Tobacco Social History   Tobacco Use  Smoking Status Former Smoker  . Quit date: 03/20/1976  . Years since quitting: 43.0  Smokeless Tobacco Never Used     Counseling given: Not Answered   Clinical Intake:  Pre-visit preparation completed: Yes  Pain : No/denies pain     Nutritional Risks: None Diabetes: No  How often do you need to have someone help you when you read instructions, pamphlets, or other written materials from your doctor or pharmacy?: 1 - Never What is the last grade level you completed in school?: 3 associate degrees  Interpreter Needed?: No  Information entered by :: CJohnson, LPN  Past Medical History:  Diagnosis Date  . Breast lump    left  . Eczema   .  Family hx of colon cancer   . Kidney cysts    small, right 10/2002  . Normal cardiac stress test 9/10   exercise stress test normal but limited by exercise intolerance  . Obesity    Past Surgical History:  Procedure Laterality Date  . CHOLECYSTECTOMY  05/2002   [place]- gallstones, small renal cyst 05/2002  . FOOT SURGERY  11/1998   bunion  . PARTIAL HYSTERECTOMY  1998   fibroids, ovaries remain   Family History  Problem Relation Age of Onset  . Colon cancer Mother   . Stomach cancer Mother   . Lung cancer Father        smoker  . Hypertension Sister   . Stroke Maternal Grandmother   . Stroke Paternal Grandmother   . Hypertension Sister    Social History   Socioeconomic History  . Marital status: Single    Spouse name: Not on file  . Number of children: 2  . Years of education: Not on file  . Highest education level: Not on file  Occupational History    Employer: [employer]  Tobacco Use  . Smoking status: Former Smoker    Quit date: 03/20/1976    Years since quitting: 43.0  . Smokeless tobacco: Never Used  Substance and Sexual Activity  . Alcohol use: No    Alcohol/week: 0.0 standard drinks  . Drug use: No  . Sexual activity: Not Currently  Other Topics Concern  . Not on file  Social History Narrative  . Not on  file   Social Determinants of Health   Financial Resource Strain: Low Risk   . Difficulty of Paying Living Expenses: Not hard at all  Food Insecurity: No Food Insecurity  . Worried About Charity fundraiser in the Last Year: Never true  . Ran Out of Food in the Last Year: Never true  Transportation Needs: No Transportation Needs  . Lack of Transportation (Medical): No  . Lack of Transportation (Non-Medical): No  Physical Activity: Inactive  . Days of Exercise per Week: 0 days  . Minutes of Exercise per Session: 0 min  Stress: No Stress Concern Present  . Feeling of Stress : Not at all  Social Connections:   . Frequency of Communication with  Friends and Family: Not on file  . Frequency of Social Gatherings with Friends and Family: Not on file  . Attends Religious Services: Not on file  . Active Member of Clubs or Organizations: Not on file  . Attends Archivist Meetings: Not on file  . Marital Status: Not on file    Outpatient Encounter Medications as of 03/11/2019  Medication Sig  . triamcinolone cream (KENALOG) 0.1 % Apply 1 application topically 2 (two) times daily. To affected areas  . trimethoprim-polymyxin b (POLYTRIM) ophthalmic solution Place 1 drop into both eyes every 6 (six) hours. While awake  . [DISCONTINUED] famotidine (PEPCID) 20 MG tablet Take 1 tablet (20 mg total) by mouth 2 (two) times daily.   No facility-administered encounter medications on file as of 03/11/2019.    Activities of Daily Living In your present state of health, do you have any difficulty performing the following activities: 03/11/2019  Hearing? N  Vision? Y  Comment worsening eye sight  Difficulty concentrating or making decisions? N  Walking or climbing stairs? N  Dressing or bathing? N  Doing errands, shopping? N  Preparing Food and eating ? N  Using the Toilet? N  In the past six months, have you accidently leaked urine? N  Do you have problems with loss of bowel control? N  Managing your Medications? N  Managing your Finances? N  Housekeeping or managing your Housekeeping? N  Some recent data might be hidden    Patient Care Team: Tower, Wynelle Fanny, MD as PCP - General (Family Medicine)    Assessment:   This is a routine wellness examination for Barbara Kramer.  Exercise Activities and Dietary recommendations Current Exercise Habits: The patient does not participate in regular exercise at present, Exercise limited by: None identified  Goals    . Patient Stated     03/11/2019, I will try to work on losing some weight.       Fall Risk Fall Risk  03/11/2019 01/29/2018  Falls in the past year? 0 0  Number falls in  past yr: 0 -  Injury with Fall? 0 -  Follow up Falls evaluation completed -   Is the patient's home free of loose throw rugs in walkways, pet beds, electrical cords, etc?   yes      Grab bars in the bathroom? no      Handrails on the stairs?   yes      Adequate lighting?   yes  Timed Get Up and Go performed: N/A  Depression Screen PHQ 2/9 Scores 03/11/2019 01/29/2018 10/11/2016 11/21/2012  PHQ - 2 Score 0 0 0 0  PHQ- 9 Score 0 - 2 -     Cognitive Function MMSE - Mini Mental State Exam 03/11/2019  Orientation  to time 5  Orientation to Place 5  Registration 3  Attention/ Calculation 5  Recall 3  Language- repeat 1       Mini Cog  Mini-Cog screen was completed. Maximum score is 22. A value of 0 denotes this part of the MMSE was not completed or the patient failed this part of the Mini-Cog screening.  Immunization History  Administered Date(s) Administered  . Influenza,inj,Quad PF,6+ Mos 02/21/2018  . Pneumococcal Conjugate-13 02/21/2018  . Td 08/15/2006  . Tdap 04/22/2015    Qualifies for Shingles Vaccine? yes  Screening Tests Health Maintenance  Topic Date Due  . COLONOSCOPY  01/29/2017  . DEXA SCAN  12/25/2017  . INFLUENZA VACCINE  10/19/2018  . MAMMOGRAM  01/03/2019  . PNA vac Low Risk Adult (2 of 2 - PPSV23) 02/22/2019  . TETANUS/TDAP  04/21/2025  . Hepatitis C Screening  Completed    Cancer Screenings: Lung: Low Dose CT Chest recommended if Age 39-80 years, 30 pack-year currently smoking OR have quit w/in 15years. Patient does not qualify. Breast:  Up to date on Mammogram? No, Patient wants this ordered and scheduled. Provider notified.    Up to date of Bone Density/Dexa? No, Patient wants this ordered and scheduled. Provider notified. Colorectal: Patient wants referral ordered to have this completed. Provider notified.   Additional Screenings:  Hepatitis C Screening: 10/19/2014     Plan:    Patient wants to work on losing some weight.    I have  personally reviewed and noted the following in the patient's chart:   . Medical and social history . Use of alcohol, tobacco or illicit drugs  . Current medications and supplements . Functional ability and status . Nutritional status . Physical activity . Advanced directives . List of other physicians . Hospitalizations, surgeries, and ER visits in previous 12 months . Vitals . Screenings to include cognitive, depression, and falls . Referrals and appointments  In addition, I have reviewed and discussed with patient certain preventive protocols, quality metrics, and best practice recommendations. A written personalized care plan for preventive services as well as general preventive health recommendations were provided to patient.     Andrez Grime, LPN  [id number]

## 2019-03-11 NOTE — Progress Notes (Signed)
PCP notes:  Health Maintenance: Needs pneumococcal 23 and flu vaccines  Please order- colonoscopy, mammogram and Dexa (Patient agreed to have these done)   Abnormal Screenings: none   Patient concerns: none   Nurse concerns: none   Next PCP appt.: 03/18/2019 @ 9:30 am

## 2019-03-11 NOTE — Patient Instructions (Signed)
[name] , Thank you for taking time to come for your Barbara Kramer. I appreciate your ongoing commitment to your health goals. Please review the following plan we discussed and let me know if I can assist you in the future.   Screening recommendations/referrals: Colonoscopy: will be scheduled Mammogram: will be scheduled Bone Density: will be scheduled  Recommended yearly ophthalmology/optometry Kramer for glaucoma screening and checkup Recommended yearly dental Kramer for hygiene and checkup  Vaccinations: Influenza vaccine: will get at physical Pneumococcal vaccine: will get at physical Tdap vaccine: Up to date, completed 04/22/2015 Shingles vaccine: discussed    Advanced directives: Advance directive discussed with you today. I have provided a copy for you to complete at home and have notarized. Once this is complete please bring a copy in to our office so we can scan it into your chart.  Conditions/risks identified: hyperglycemia  Next appointment: 03/17/2019 @ 10:15 am    Preventive Care 65 Years and Older, Female Preventive care refers to lifestyle choices and visits with your health care provider that can promote health and wellness. What does preventive care include?  A yearly physical exam. This is also called an annual well check.  Dental exams once or twice a year.  Routine eye exams. Ask your health care provider how often you should have your eyes checked.  Personal lifestyle choices, including:  Daily care of your teeth and gums.  Regular physical activity.  Eating a healthy diet.  Avoiding tobacco and drug use.  Limiting alcohol use.  Practicing safe sex.  Taking low-dose aspirin every day.  Taking vitamin and mineral supplements as recommended by your health care provider. What happens during an annual well check? The services and screenings done by your health care provider during your annual well check will depend on your age, overall  health, lifestyle risk factors, and family history of disease. Counseling  Your health care provider may ask you questions about your:  Alcohol use.  Tobacco use.  Drug use.  Emotional well-being.  Home and relationship well-being.  Sexual activity.  Eating habits.  History of falls.  Memory and ability to understand (cognition).  Work and work Statistician.  Reproductive health. Screening  You may have the following tests or measurements:  Height, weight, and BMI.  Blood pressure.  Lipid and cholesterol levels. These may be checked every 5 years, or more frequently if you are over 67 years old.  Skin check.  Lung cancer screening. You may have this screening every year starting at age 36 if you have a 30-pack-year history of smoking and currently smoke or have quit within the past 15 years.  Fecal occult blood test (FOBT) of the stool. You may have this test every year starting at age 67.  Flexible sigmoidoscopy or colonoscopy. You may have a sigmoidoscopy every 5 years or a colonoscopy every 10 years starting at age 27.  Hepatitis C blood test.  Hepatitis B blood test.  Sexually transmitted disease (STD) testing.  Diabetes screening. This is done by checking your blood sugar (glucose) after you have not eaten for a while (fasting). You may have this done every 1-3 years.  Bone density scan. This is done to screen for osteoporosis. You may have this done starting at age 53.  Mammogram. This may be done every 1-2 years. Talk to your health care provider about how often you should have regular mammograms. Talk with your health care provider about your test results, treatment options, and if necessary,  the need for more tests. Vaccines  Your health care provider may recommend certain vaccines, such as:  Influenza vaccine. This is recommended every year.  Tetanus, diphtheria, and acellular pertussis (Tdap, Td) vaccine. You may need a Td booster every 10  years.  Zoster vaccine. You may need this after age 66.  Pneumococcal 13-valent conjugate (PCV13) vaccine. One dose is recommended after age 8.  Pneumococcal polysaccharide (PPSV23) vaccine. One dose is recommended after age 75. Talk to your health care provider about which screenings and vaccines you need and how often you need them. This information is not intended to replace advice given to you by your health care provider. Make sure you discuss any questions you have with your health care provider. Document Released: 04/02/2015 Document Revised: 11/24/2015 Document Reviewed: 01/05/2015 Elsevier Interactive Patient Education  2017 [place] Prevention in the Home Falls can cause injuries. They can happen to people of all ages. There are many things you can do to make your home safe and to help prevent falls. What can I do on the outside of my home?  Regularly fix the edges of walkways and driveways and fix any cracks.  Remove anything that might make you trip as you walk through a door, such as a raised step or threshold.  Trim any bushes or trees on the path to your home.  Use bright outdoor lighting.  Clear any walking paths of anything that might make someone trip, such as rocks or tools.  Regularly check to see if handrails are loose or broken. Make sure that both sides of any steps have handrails.  Any raised decks and porches should have guardrails on the edges.  Have any leaves, snow, or ice cleared regularly.  Use sand or salt on walking paths during winter.  Clean up any spills in your garage right away. This includes oil or grease spills. What can I do in the bathroom?  Use night lights.  Install grab bars by the toilet and in the tub and shower. Do not use towel bars as grab bars.  Use non-skid mats or decals in the tub or shower.  If you need to sit down in the shower, use a plastic, non-slip stool.  Keep the floor dry. Clean up any water that  spills on the floor as soon as it happens.  Remove soap buildup in the tub or shower regularly.  Attach bath mats securely with double-sided non-slip rug tape.  Do not have throw rugs and other things on the floor that can make you trip. What can I do in the bedroom?  Use night lights.  Make sure that you have a light by your bed that is easy to reach.  Do not use any sheets or blankets that are too big for your bed. They should not hang down onto the floor.  Have a firm chair that has side arms. You can use this for support while you get dressed.  Do not have throw rugs and other things on the floor that can make you trip. What can I do in the kitchen?  Clean up any spills right away.  Avoid walking on wet floors.  Keep items that you use a lot in easy-to-reach places.  If you need to reach something above you, use a strong step stool that has a grab bar.  Keep electrical cords out of the way.  Do not use floor polish or wax that makes floors slippery. If you must use  wax, use non-skid floor wax.  Do not have throw rugs and other things on the floor that can make you trip. What can I do with my stairs?  Do not leave any items on the stairs.  Make sure that there are handrails on both sides of the stairs and use them. Fix handrails that are broken or loose. Make sure that handrails are as long as the stairways.  Check any carpeting to make sure that it is firmly attached to the stairs. Fix any carpet that is loose or worn.  Avoid having throw rugs at the top or bottom of the stairs. If you do have throw rugs, attach them to the floor with carpet tape.  Make sure that you have a light switch at the top of the stairs and the bottom of the stairs. If you do not have them, ask someone to add them for you. What else can I do to help prevent falls?  Wear shoes that:  Do not have high heels.  Have rubber bottoms.  Are comfortable and fit you well.  Are closed at the  toe. Do not wear sandals.  If you use a stepladder:  Make sure that it is fully opened. Do not climb a closed stepladder.  Make sure that both sides of the stepladder are locked into place.  Ask someone to hold it for you, if possible.  Clearly mark and make sure that you can see:  Any grab bars or handrails.  First and last steps.  Where the edge of each step is.  Use tools that help you move around (mobility aids) if they are needed. These include:  Canes.  Walkers.  Scooters.  Crutches.  Turn on the lights when you go into a dark area. Replace any light bulbs as soon as they burn out.  Set up your furniture so you have a clear path. Avoid moving your furniture around.  If any of your floors are uneven, fix them.  If there are any pets around you, be aware of where they are.  Review your medicines with your doctor. Some medicines can make you feel dizzy. This can increase your chance of falling. Ask your doctor what other things that you can do to help prevent falls. This information is not intended to replace advice given to you by your health care provider. Make sure you discuss any questions you have with your health care provider. Document Released: 12/31/2008 Document Revised: 08/12/2015 Document Reviewed: 04/10/2014 Elsevier Interactive Patient Education  2017 [employer].

## 2019-03-17 ENCOUNTER — Encounter: Payer: Medicare HMO | Admitting: Family Medicine

## 2019-03-18 ENCOUNTER — Other Ambulatory Visit: Payer: Self-pay

## 2019-03-18 ENCOUNTER — Encounter: Payer: Self-pay | Admitting: Family Medicine

## 2019-03-18 ENCOUNTER — Ambulatory Visit (INDEPENDENT_AMBULATORY_CARE_PROVIDER_SITE_OTHER): Payer: Medicare HMO | Admitting: Family Medicine

## 2019-03-18 VITALS — BP 136/72 | HR 75 | Temp 97.4°F | Ht 61.5 in | Wt 300.6 lb

## 2019-03-18 DIAGNOSIS — R7303 Prediabetes: Secondary | ICD-10-CM | POA: Diagnosis not present

## 2019-03-18 DIAGNOSIS — E2839 Other primary ovarian failure: Secondary | ICD-10-CM

## 2019-03-18 DIAGNOSIS — Z0000 Encounter for general adult medical examination without abnormal findings: Secondary | ICD-10-CM

## 2019-03-18 DIAGNOSIS — Z1231 Encounter for screening mammogram for malignant neoplasm of breast: Secondary | ICD-10-CM | POA: Diagnosis not present

## 2019-03-18 DIAGNOSIS — Z23 Encounter for immunization: Secondary | ICD-10-CM | POA: Diagnosis not present

## 2019-03-18 DIAGNOSIS — Z1211 Encounter for screening for malignant neoplasm of colon: Secondary | ICD-10-CM

## 2019-03-18 NOTE — Assessment & Plan Note (Signed)
Ref done for first dexa ([facility]) Pt will call to make first appt Disc imp of ca and D supplementation  Also exercise  No falls or fx

## 2019-03-18 NOTE — Assessment & Plan Note (Signed)
Pt's mother had colon cancer  Pt had colonoscopy 2008  Is interested in another one if her weight does not prohibit it /also depends on coverage Ref to GI for consult to discuss options

## 2019-03-18 NOTE — Patient Instructions (Addendum)
Eating out /processed foods will make it hard to loose weight  Try to get most of your carbohydrates from produce (with the exception of white potatoes)  Eat less bread/pasta/rice/snack foods/cereals/sweets and other items from the middle of the grocery store (processed carbs)   Also start adding exercise in -start with the 10 minutes  Start scheduling exercise into your day and get consistent   Try to get 1200-1500 mg of calcium per day with at least 1000 iu of vitamin D - for bone health   Flu shot today  Pneumonia vaccine today   Call the [facility] number to schedule your mammogram and bone density test (I did the referrals already)    If you are interested in the new shingles vaccine (Shingrix) - call your local pharmacy to check on coverage and availability  If affordable, get on a wait list at your pharmacy to get the vaccine.  If you become interested in a gynecologic evaluation please let me know and I will do a referral

## 2019-03-18 NOTE — Progress Notes (Signed)
Subjective:    Patient ID: Barbara Kramer, female    DOB: Jan 06, 1951, 68 y.o.   MRN: [id number]  This visit occurred during the SARS-CoV-2 public health emergency.  Safety protocols were in place, including screening questions prior to the visit, additional usage of staff PPE, and extensive cleaning of exam room while observing appropriate contact time as indicated for disinfecting solutions.    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  (works at home and needs to fit it in)  Had amw on 12/22 Noted needs pna 23 and flu vaccines  Also address colonoscopy, mamm, and dexa   Colon cancer screening 11/08 colonoscopy  Mother had h/o colon cancer   Has not had a bone density tests  Ready to schedule at [facility] No falls or fracture  Not taking ca and D  Flu shot given pna 23 given  Mammogram 10/19 - ref done and pt will schedule at [facility] Self breast exam -no lumps or changes   She has had a partial hysterectomy in the past   Zoster status - interested in shingrix if covered   BP Readings from Last 3 Encounters:  03/18/19 136/72  05/27/18 132/74  04/12/18 134/70   Pulse Readings from Last 3 Encounters:  03/18/19 75  05/27/18 73  04/12/18 69   Cholesterol Lab Results  Component Value Date   CHOL 143 03/11/2019   CHOL 151 01/22/2018   CHOL 143 10/05/2016   Lab Results  Component Value Date   HDL 53.00 03/11/2019   HDL 53.40 01/22/2018   HDL 51.70  10/05/2016   Lab Results  Component Value Date   LDLCALC 80 03/11/2019   LDLCALC 83 01/22/2018   LDLCALC 80 10/05/2016   Lab Results  Component Value Date   TRIG 49.0 03/11/2019   TRIG 70.0 01/22/2018   TRIG 60.0 10/05/2016   Lab Results  Component Value Date   CHOLHDL 3 03/11/2019   CHOLHDL 3 01/22/2018   CHOLHDL 3 10/05/2016   No results found for: LDLDIRECT Good genes/ stable cholesterol  Does eat too much fast food   Prediabetes Lab Results  Component Value Date   HGBA1C 6.1 03/11/2019  stable from 6.2  She is occasionally a sweet eater  Also likes bread   Feels like something is in L ear  She could not get wax out   Other labs Lab Results  Component Value Date   WBC 8.8 03/11/2019   HGB 12.7 03/11/2019   HCT 40.5 03/11/2019   MCV 88.3 03/11/2019   PLT 281.0 03/11/2019   Lab Results  Component Value Date   TSH 3.02 03/11/2019    Lab Results  Component Value Date   ALT 12 03/11/2019   AST 18 03/11/2019   ALKPHOS 66 03/11/2019   BILITOT 0.4 03/11/2019  Lab Results  Component Value Date   CREATININE 0.99 03/11/2019   BUN 12 03/11/2019   NA 138 03/11/2019   K 4.0 03/11/2019   CL 105 03/11/2019   CO2 26 03/11/2019    Patient Active Problem List   Diagnosis Date Noted  . Welcome to Medicare preventive visit 01/29/2018  . OSA (obstructive sleep apnea) 01/29/2018  . Estrogen deficiency 01/29/2018  . Allergic rhinitis 05/15/2017  . Screening mammogram, encounter for 10/11/2016  . Snoring 10/11/2016  . Prediabetes 12/15/2014  . Osteoarthritis of left knee 10/16/2013  . Left knee pain 10/15/2013  . Colon cancer screening 11/20/2012  . Routine gynecological examination 11/17/2011  . Other screening mammogram 11/17/2011  . Low back pain 10/18/2010  . Routine general medical examination at a health care facility 10/06/2010  . 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 Morbid obesity ([place]) 08/02/2006  . Eczema 08/02/2006   Past Medical History:    Diagnosis Date  . Breast lump    left  . Eczema   . Family hx of colon cancer   . Kidney cysts    small, right 10/2002  . Normal cardiac stress test 9/10   exercise stress test normal but limited by exercise intolerance  . Obesity    Past Surgical History:  Procedure Laterality Date  . CHOLECYSTECTOMY  05/2002   [place]- gallstones, small renal cyst 05/2002  . FOOT SURGERY  11/1998   bunion  . PARTIAL HYSTERECTOMY  1998   fibroids, ovaries remain   Social History   Tobacco Use  . Smoking status: Former Smoker    Quit date: 03/20/1976    Years since quitting: 43.0  . Smokeless tobacco: Never Used  Substance Use Topics  . Alcohol use: No    Alcohol/week: 0.0 standard drinks  . Drug use: No   Family History  Problem Relation Age of Onset  . Colon cancer Mother   . Stomach cancer Mother   . Lung cancer Father        smoker  . Hypertension Sister   . Stroke Maternal Grandmother   . Stroke Paternal Grandmother   . Hypertension Sister    Allergies  Allergen Reactions  . Calcium Carbonate     REACTION: constipation  . Penicillins Rash    Has patient had a PCN reaction causing immediate rash, facial/tongue/throat swelling, SOB or lightheadedness with hypotension: no Has patient had a PCN reaction causing severe rash involving mucus membranes or skin necrosis: no Has patient had a PCN reaction that required hospitalization: no Has patient had a PCN reaction occurring within the last 10 years: no If all of the above answers are "NO", then may proceed with Cephalosporin use.    Current Outpatient Medications on File Prior to Visit  Medication Sig Dispense Refill  . [DISCONTINUED] famotidine (PEPCID) 20 MG tablet Take 1 tablet (20 mg total) by mouth 2 (two) times daily. 30 tablet 0   No current facility-administered medications on file prior to visit.    Review of Systems  Constitutional: Negative for activity change, appetite change, fatigue, fever and unexpected weight  change.  HENT: Negative for congestion, ear pain, rhinorrhea, sinus pressure and sore throat.   Eyes: Negative for pain, redness and visual disturbance.  Respiratory: Negative for cough, shortness of breath and wheezing.   Cardiovascular: Negative for chest pain and palpitations.  Gastrointestinal: Negative for abdominal pain, blood in stool, constipation and diarrhea.  Endocrine: Negative for polydipsia and polyuria.  Genitourinary: Negative for dysuria, frequency and urgency.  Musculoskeletal: Positive for arthralgias and back pain. Negative for myalgias.  Skin: Negative for pallor and rash.  Allergic/Immunologic: Negative for environmental allergies.  Neurological: Negative for dizziness, syncope and headaches.  Hematological: Negative for adenopathy. Does not bruise/bleed easily.  Psychiatric/Behavioral: Negative for decreased concentration and dysphoric mood. The patient is not nervous/anxious.        Objective:   Physical Exam Constitutional:      General: She is not in acute distress.    Appearance: Normal appearance. She is well-developed. She is obese. She is not ill-appearing or diaphoretic.  HENT:     Head: Normocephalic and atraumatic.     Right Ear: Tympanic membrane, ear canal and external ear normal.     Left Ear: Tympanic membrane, ear canal and external ear normal.     Nose: Nose normal. No congestion.     Mouth/Throat:     Mouth: Mucous membranes are moist.     Pharynx: Oropharynx is clear. No posterior oropharyngeal erythema.  Eyes:     General: No scleral icterus.    Extraocular Movements: Extraocular movements intact.     Conjunctiva/sclera: Conjunctivae normal.     Pupils: Pupils are equal, round, and reactive to light.  Neck:     Thyroid: No thyromegaly.     Vascular: No carotid bruit or JVD.  Cardiovascular:     Rate and Rhythm: Normal rate and regular rhythm.     Pulses: Normal pulses.     Heart sounds: Normal heart sounds. No gallop.   Pulmonary:      Effort: Pulmonary effort is normal. No respiratory distress.     Breath sounds: Normal breath sounds. No wheezing.     Comments: Good air exch Chest:     Chest wall: No tenderness.  Abdominal:     General: Bowel sounds are normal. There is no distension or abdominal bruit.     Palpations: Abdomen is soft. There is no mass.     Tenderness: There is no abdominal tenderness.     Hernia: No hernia is present.  Genitourinary:    Comments: Breast exam: No mass, nodules, thickening, tenderness, bulging, retraction, inflamation, nipple discharge or skin changes noted.  No axillary or clavicular LA.     Musculoskeletal:        General: No tenderness. Normal range of motion.     Cervical back: Normal range of motion and neck supple. No rigidity. No muscular tenderness.     Right lower leg: No edema.     Left lower leg: No edema.  Lymphadenopathy:     Cervical: No cervical adenopathy.  Skin:    General: Skin is warm and dry.     Coloration: Skin is not pale.     Findings: No erythema or rash.     Comments: Some sks and lentigines on face  Neurological:     Mental Status: She is alert. Mental status is at baseline.     Cranial Nerves: No cranial nerve deficit.     Motor: No abnormal muscle tone.     Coordination: Coordination normal.     Gait: Gait normal.     Deep Tendon Reflexes: Reflexes are normal and symmetric. Reflexes normal.  Psychiatric:        Mood and Affect: Mood normal.        Cognition and Memory: Cognition and memory normal.     Comments: Pleasant            Assessment & Plan:   Problem List  Items Addressed This Visit      Other   Morbid obesity ([place])    Discussed how this problem influences overall health and the risks it imposes  Reviewed plan for weight loss with lower calorie diet (via better food choices and also portion control or program like weight watchers) and exercise building up to or more than 30 minutes 5 days per week including some aerobic  activity   Plan made to eat more often/small portions Also avoid processed food/eating out and assembling more meals with produce at home Will start a 15 minute fitness video program and use her machine for exercise (urged to schedule it into her day)      Routine general medical examination at a health care facility - Primary    Reviewed health habits including diet and exercise and skin cancer prevention Reviewed appropriate screening tests for age  Also reviewed health mt list, fam hx and immunization status , as well as social and family history   See HPI Labs reviewed  amw reviewed  Flu vaccine and pna 23 vaccine today  Discussion of wt loss plan Ref done to GI to discuss colon screening options (mother had colon cancer) - she desires colonoscopy if safe to do with her current weight  Ref for dexa -pt will schedule  Counseled on ca and D intake for bone health  Ref for mammogram  Pt will schedule these both at [facility]  Discussed shigrix vaccine- she will look into coverage  Disc ref to gyn for exam in the future (has had a partial hysterectomy)         Relevant Orders   Flu Vaccine QUAD High Dose(Fluad) (Completed)   Pneumococcal polysaccharide vaccine 23-valent greater than or equal to 2yo subcutaneous/IM (Completed)   Colon cancer screening    Pt's mother had colon cancer  Pt had colonoscopy 2008  Is interested in another one if her weight does not prohibit it /also depends on coverage Ref to GI for consult to discuss options       Relevant Orders   Ambulatory referral to Gastroenterology   Prediabetes    Stable Lab Results  Component Value Date   HGBA1C 6.1 03/11/2019   disc imp of low glycemic diet and wt loss to prevent DM2  Would no doubt improve with better diet and wt loss       Screening mammogram, encounter for    Ref done for screening mammogram  Enc self breast exams Pt will schedule at [facility]      Relevant Orders   MM 3D SCREEN BREAST BILATERAL     Estrogen deficiency    Ref done for first dexa ([facility]) Pt will call to make first appt Disc imp of ca and D supplementation  Also exercise  No falls or fx      Relevant Orders   DG Bone Density    Other Visit Diagnoses    Need for influenza vaccination       Relevant Orders   Flu Vaccine QUAD High Dose(Fluad) (Completed)   Need for 23-polyvalent pneumococcal polysaccharide vaccine       Relevant Orders   Pneumococcal polysaccharide vaccine 23-valent greater than or equal to 2yo subcutaneous/IM (Completed)

## 2019-03-18 NOTE — Assessment & Plan Note (Signed)
Ref done for screening mammogram  Enc self breast exams Pt will schedule at [facility]

## 2019-03-18 NOTE — Assessment & Plan Note (Signed)
Reviewed health habits including diet and exercise and skin cancer prevention Reviewed appropriate screening tests for age  Also reviewed health mt list, fam hx and immunization status , as well as social and family history   See HPI Labs reviewed  amw reviewed  Flu vaccine and pna 23 vaccine today  Discussion of wt loss plan Ref done to GI to discuss colon screening options (mother had colon cancer) - she desires colonoscopy if safe to do with her current weight  Ref for dexa -pt will schedule  Counseled on ca and D intake for bone health  Ref for mammogram  Pt will schedule these both at [facility]  Discussed shigrix vaccine- she will look into coverage  Disc ref to gyn for exam in the future (has had a partial hysterectomy)

## 2019-03-18 NOTE — Assessment & Plan Note (Signed)
Stable Lab Results  Component Value Date   HGBA1C 6.1 03/11/2019   disc imp of low glycemic diet and wt loss to prevent DM2  Would no doubt improve with better diet and wt loss

## 2019-03-18 NOTE — Assessment & Plan Note (Signed)
Discussed how this problem influences overall health and the risks it imposes  Reviewed plan for weight loss with lower calorie diet (via better food choices and also portion control or program like weight watchers) and exercise building up to or more than 30 minutes 5 days per week including some aerobic activity   Plan made to eat more often/small portions Also avoid processed food/eating out and assembling more meals with produce at home Will start a 15 minute fitness video program and use her machine for exercise (urged to schedule it into her day)

## 2019-03-20 ENCOUNTER — Other Ambulatory Visit: Payer: Self-pay

## 2019-03-20 DIAGNOSIS — Z1231 Encounter for screening mammogram for malignant neoplasm of breast: Secondary | ICD-10-CM

## 2019-03-20 DIAGNOSIS — R29890 Loss of height: Secondary | ICD-10-CM | POA: Diagnosis not present

## 2019-03-20 DIAGNOSIS — Z780 Asymptomatic menopausal state: Secondary | ICD-10-CM | POA: Diagnosis not present

## 2019-03-20 DIAGNOSIS — E2839 Other primary ovarian failure: Secondary | ICD-10-CM

## 2019-03-20 DIAGNOSIS — Z8262 Family history of osteoporosis: Secondary | ICD-10-CM | POA: Diagnosis not present

## 2019-03-20 DIAGNOSIS — Z90710 Acquired absence of both cervix and uterus: Secondary | ICD-10-CM | POA: Diagnosis not present

## 2019-03-20 LAB — HM DEXA SCAN
HM Dexa Scan: NORMAL
HM Dexa Scan: NORMAL

## 2019-03-26 ENCOUNTER — Encounter: Payer: Self-pay | Admitting: Family Medicine

## 2019-04-09 ENCOUNTER — Encounter: Payer: Self-pay | Admitting: Family Medicine

## 2019-04-10 ENCOUNTER — Encounter: Payer: Self-pay | Admitting: Gastroenterology

## 2019-04-14 DIAGNOSIS — Z20828 Contact with and (suspected) exposure to other viral communicable diseases: Secondary | ICD-10-CM | POA: Diagnosis not present

## 2019-05-13 ENCOUNTER — Other Ambulatory Visit: Payer: Self-pay

## 2019-05-13 ENCOUNTER — Ambulatory Visit: Payer: Medicare HMO | Admitting: Gastroenterology

## 2019-05-13 ENCOUNTER — Encounter: Payer: Self-pay | Admitting: Gastroenterology

## 2019-05-13 VITALS — BP 126/68 | HR 85 | Temp 98.3°F | Ht 62.0 in | Wt 304.0 lb

## 2019-05-13 DIAGNOSIS — K219 Gastro-esophageal reflux disease without esophagitis: Secondary | ICD-10-CM

## 2019-05-13 DIAGNOSIS — Z800 Family history of malignant neoplasm of digestive organs: Secondary | ICD-10-CM

## 2019-05-13 MED ORDER — SUPREP BOWEL PREP KIT 17.5-3.13-1.6 GM/177ML PO SOLN: ORAL | 0 refills | Status: DC

## 2019-05-13 MED ORDER — FAMOTIDINE 20 MG PO TABS: ORAL_TABLET | ORAL | 3 refills | Status: DC

## 2019-05-13 NOTE — Patient Instructions (Addendum)
If you are age 67 or older, your body mass index should be between 23-30. Your Body mass index is 55.6 kg/m. If this is out of the aforementioned range listed, please consider follow up with your Primary Care Provider.  If you are age 5 or younger, your body mass index should be between 19-25. Your Body mass index is 55.6 kg/m. If this is out of the aformentioned range listed, please consider follow up with your Primary Care Provider.    You have been scheduled for a colonoscopy. Please follow written instructions given to you at your visit today.  Please pick up your prep supplies at the pharmacy within the next 1-3 days. If you use inhalers (even only as needed), please bring them with you on the day of your procedure. Your physician has requested that you go to www.startemmi.com and enter the access code given to you at your visit today. This web site gives a general overview about your procedure. However, you should still follow specific instructions given to you by our office regarding your preparation for the procedure.  Due to recent COVID-19 restrictions implemented by our local and state authorities and in an effort to keep both patients and staff as safe as possible, our hospital system now requires COVID-19 testing prior to any scheduled hospital procedure. Please go to our [facility] location drive thru testing site ([address], [place], [place] [address]) on Friday, 07-11-19 at  10:30am. There will be multiple testing areas, the first checkpoint being for pre-procedure/surgery testing. Get into the right (yellow) lane that leads to the PAT testing team. You will not be billed at the time of testing but may receive a bill later depending on your insurance. The approximate cost of the test is $100. You must agree to quarantine from the time of your testing until the procedure date on 07-15-19 . This should include staying at home with ONLY the people you live with. Avoid take-out,  grocery store shopping or leaving the house for any non-emergent reason. Failure to have your COVID-19 test done on the date and time you have been scheduled will result in cancellation of procedure. Please call our office at [phone] if you have any questions.   We have sent the following medications to your pharmacy for you to pick up at your convenience: pepcid 20mg : Take once to twice daily as needed  Thank you for entrusting me with your care and for choosing [facility], Dr. Dorado Cellar

## 2019-05-13 NOTE — Progress Notes (Signed)
HPI :  69 year old female with a history of BMI 98, family history of colon cancer, arthritis, referred here by Dr. Loura Pardon to discuss her family history of colon cancer and reflux.  Mother had colon cancer diagnosed she thinks in her 43s, she passed away from it at age 43.  Her daughter actually also has had colon polyps diagnosed at age 84s.  The patient last had a colonoscopy in 2008 with Dr. Maurene Capes which was normal.  She denies any bowel troubles, no blood in her stools, no abdominal pains.  She denies any problems with anesthesia in the past.  She has a history of GERD with regurgitation and water brash at times at night.  Not much pyrosis.  No dysphagia.  No family history of esophageal or gastric cancer.  She has not been taking anything for this.  Symptoms appear mild and intermittent.   Colonoscopy 01/2007 - normal, Dr. Olevia Perches    Past Medical History:  Diagnosis Date  . Arthritis   . Breast lump    left  . Eczema   . Family hx of colon cancer   . Kidney cysts    small, right 10/2002  . Normal cardiac stress test 9/10   exercise stress test normal but limited by exercise intolerance  . Obesity      Past Surgical History:  Procedure Laterality Date  . CHOLECYSTECTOMY  05/2002   [place]- gallstones, small renal cyst 05/2002  . FOOT SURGERY  11/1998   bunion  . PARTIAL HYSTERECTOMY  1998   fibroids, ovaries remain   Family History  Problem Relation Age of Onset  . Colon cancer Mother   . Stomach cancer Mother   . Lung cancer Father        smoker  . Hypertension Sister   . Stroke Maternal Grandmother   . Stroke Paternal Grandmother   . Hypertension Sister   . Colon polyps Daughter    Social History   Tobacco Use  . Smoking status: Former Smoker    Quit date: 03/20/1976    Years since quitting: 43.1  . Smokeless tobacco: Never Used  Substance Use Topics  . Alcohol use: No    Alcohol/week: 0.0 standard drinks  . Drug use: No   No current outpatient  medications on file.   No current facility-administered medications for this visit.   Allergies  Allergen Reactions  . Calcium Carbonate     REACTION: constipation  . Penicillins Rash    Has patient had a PCN reaction causing immediate rash, facial/tongue/throat swelling, SOB or lightheadedness with hypotension: no Has patient had a PCN reaction causing severe rash involving mucus membranes or skin necrosis: no Has patient had a PCN reaction that required hospitalization: no Has patient had a PCN reaction occurring within the last 10 years: no If all of the above answers are "NO", then may proceed with Cephalosporin use.      Review of Systems: All systems reviewed and negative except where noted in HPI.   Lab Results  Component Value Date   WBC 8.8 03/11/2019   HGB 12.7 03/11/2019   HCT 40.5 03/11/2019   MCV 88.3 03/11/2019   PLT 281.0 03/11/2019    Lab Results  Component Value Date   CREATININE 0.99 03/11/2019   BUN 12 03/11/2019   NA 138 03/11/2019   K 4.0 03/11/2019   CL 105 03/11/2019   CO2 26 03/11/2019    Lab Results  Component Value Date   ALT 12  03/11/2019   AST 18 03/11/2019   ALKPHOS 66 03/11/2019   BILITOT 0.4 03/11/2019   No results found for: INR, PROTIME   Physical Exam: BP 126/68   Pulse 85   Temp 98.3 F (36.8 C)   Ht 5\' 2"  (1.575 m)   Wt (!) 304 lb (137.9 kg)   BMI 55.60 kg/m  Constitutional: Pleasant,well-developed, female in no acute distress. HEENT: Normocephalic and atraumatic. Conjunctivae are normal. No scleral icterus. Neck supple.  Cardiovascular: Normal rate, regular rhythm.  Pulmonary/chest: Effort normal and breath sounds normal. No wheezing, rales or rhonchi. Abdominal: Soft, protuberant, nontender. There are no masses palpable.  Extremities: no edema Lymphadenopathy: No cervical adenopathy noted. Neurological: Alert and oriented to person place and time. Skin: Skin is warm and dry. No rashes noted. Psychiatric: Normal  mood and affect. Behavior is normal.   ASSESSMENT AND PLAN: 69 year old female here for new patient assessment the following:  Family history of colon cancer / morbid obesity - patient is asymptomatic but has strong family history of colon cancer and is overdue for screening colonoscopy.  We discussed colonoscopy and what it entails, risks and benefits of the exam and that of anesthesia.  Given her BMI greater than 50, her case must be done at the hospital with anesthesia support there.  We discussed this for a bit and she is agreeable to proceed with colonoscopy at the hospital following discussion.  This will likely be done in April given limited openings at the hospital right now, can move her up if openings arise sooner in the interim.  Further recommendations pending results she agreed.  GERD - mild intermittent reflux symptoms that appear to bother her most at night.  Discussed options, will try Pepcid 20 mg once to twice a day as needed, she may want to try taking this prior to bed initially to see if that helps reduce nocturnal symptoms.  She can follow-up as needed if symptoms are not improved with this.  [place] Cellar, MD [facility]  CC: [place], Wynelle Fanny, MD

## 2019-06-06 ENCOUNTER — Ambulatory Visit: Payer: Medicare HMO | Attending: Internal Medicine

## 2019-06-06 DIAGNOSIS — Z23 Encounter for immunization: Secondary | ICD-10-CM

## 2019-06-06 NOTE — Progress Notes (Signed)
   Covid-19 Vaccination Clinic  Name:  Barbara Kramer    MRN: [id number] DOB: 07-21-1950  06/06/2019  Barbara Kramer was observed post Covid-19 immunization for 15 minutes without incident. She was provided with Vaccine Information Sheet and instruction to access the V-Safe system.   Barbara Kramer was instructed to call 911 with any severe reactions post vaccine: Marland Kitchen Difficulty breathing  . Swelling of face and throat  . A fast heartbeat  . A bad rash all over body  . Dizziness and weakness   Immunizations Administered    Name Date Dose VIS Date Route   Pfizer COVID-19 Vaccine 06/06/2019 12:49 PM 0.3 mL 02/28/2019 Intramuscular   Manufacturer: [place]   Lot: [id number]   [place]: [id number]

## 2019-06-27 ENCOUNTER — Ambulatory Visit: Payer: Medicare HMO | Attending: Internal Medicine

## 2019-06-27 DIAGNOSIS — Z23 Encounter for immunization: Secondary | ICD-10-CM

## 2019-06-27 NOTE — Progress Notes (Signed)
   Covid-19 Vaccination Clinic  Name:  MURPHEE TOUPIN    MRN: [id number] DOB: 03-09-1951  06/27/2019  Ms. Eull was observed post Covid-19 immunization for 30 minutes based on pre-vaccination screening without incident. She was provided with Vaccine Information Sheet and instruction to access the V-Safe system.   Ms. Drilling was instructed to call 911 with any severe reactions post vaccine: Marland Kitchen Difficulty breathing  . Swelling of face and throat  . A fast heartbeat  . A bad rash all over body  . Dizziness and weakness   Immunizations Administered    Name Date Dose VIS Date Route   Pfizer COVID-19 Vaccine 06/27/2019 12:44 PM 0.3 mL 02/28/2019 Intramuscular   Manufacturer: [place]   Lot: [id number]   [place]: [id number]

## 2019-07-11 ENCOUNTER — Other Ambulatory Visit (HOSPITAL_COMMUNITY)
Admission: RE | Admit: 2019-07-11 | Discharge: 2019-07-11 | Disposition: A | Discharge status: Home / Self Care | Payer: Medicare HMO | Source: Ambulatory Visit | Attending: Gastroenterology | Admitting: Gastroenterology

## 2019-07-11 DIAGNOSIS — Z20822 Contact with and (suspected) exposure to covid-19: Secondary | ICD-10-CM | POA: Insufficient documentation

## 2019-07-11 DIAGNOSIS — Z01812 Encounter for preprocedural laboratory examination: Secondary | ICD-10-CM | POA: Diagnosis not present

## 2019-07-11 LAB — SARS CORONAVIRUS 2 (TAT 6-24 HRS): SARS Coronavirus 2: NEGATIVE

## 2019-07-14 NOTE — Anesthesia Preprocedure Evaluation (Addendum)
Anesthesia Evaluation  Patient identified by MRN, date of birth, ID band Patient awake    Reviewed: Allergy & Precautions, NPO status , Patient's Chart, lab work & pertinent test results  Airway Mallampati: II  TM Distance: >3 FB Neck ROM: Full    Dental  (+) Chipped,    Pulmonary sleep apnea , former smoker,    Pulmonary exam normal breath sounds clear to auscultation       Cardiovascular negative cardio ROS Normal cardiovascular exam Rhythm:Regular Rate:Normal     Neuro/Psych negative neurological ROS  negative psych ROS   GI/Hepatic Neg liver ROS, Bowel prep,  Endo/Other  Morbid obesity (SUPER)  Renal/GU negative Renal ROS     Musculoskeletal negative musculoskeletal ROS (+)   Abdominal (+) + obese,   Peds  Hematology negative hematology ROS (+)   Anesthesia Other Findings FAMILY HISTORY OF COLON CANCER  MORBID OBESITY  Reproductive/Obstetrics                            Anesthesia Physical Anesthesia Plan  ASA: IV  Anesthesia Plan: MAC   Post-op Pain Management:    Induction: Intravenous  PONV Risk Score and Plan: 2 and Propofol infusion and Treatment may vary due to age or medical condition  Airway Management Planned: Simple Face Mask  Additional Equipment:   Intra-op Plan:   Post-operative Plan:   Informed Consent: I have reviewed the patients History and Physical, chart, labs and discussed the procedure including the risks, benefits and alternatives for the proposed anesthesia with the patient or authorized representative who has indicated his/her understanding and acceptance.     Dental advisory given  Plan Discussed with: CRNA  Anesthesia Plan Comments:        Anesthesia Quick Evaluation

## 2019-07-15 ENCOUNTER — Encounter (HOSPITAL_COMMUNITY): Payer: Self-pay | Admitting: Gastroenterology

## 2019-07-15 ENCOUNTER — Encounter (HOSPITAL_COMMUNITY)
Admission: RE | Disposition: A | Discharge status: Home / Self Care | Payer: Self-pay | Source: Ambulatory Visit | Attending: Gastroenterology

## 2019-07-15 ENCOUNTER — Other Ambulatory Visit: Payer: Self-pay

## 2019-07-15 ENCOUNTER — Ambulatory Visit (HOSPITAL_COMMUNITY): Payer: Medicare HMO | Admitting: Anesthesiology

## 2019-07-15 ENCOUNTER — Ambulatory Visit (HOSPITAL_COMMUNITY)
Admission: RE | Admit: 2019-07-15 | Discharge: 2019-07-15 | Disposition: A | Discharge status: Home / Self Care | Payer: Medicare HMO | Source: Ambulatory Visit | Attending: Gastroenterology | Admitting: Gastroenterology

## 2019-07-15 DIAGNOSIS — G4730 Sleep apnea, unspecified: Secondary | ICD-10-CM | POA: Insufficient documentation

## 2019-07-15 DIAGNOSIS — M1990 Unspecified osteoarthritis, unspecified site: Secondary | ICD-10-CM | POA: Diagnosis not present

## 2019-07-15 DIAGNOSIS — K621 Rectal polyp: Secondary | ICD-10-CM | POA: Diagnosis not present

## 2019-07-15 DIAGNOSIS — Z87891 Personal history of nicotine dependence: Secondary | ICD-10-CM | POA: Insufficient documentation

## 2019-07-15 DIAGNOSIS — K219 Gastro-esophageal reflux disease without esophagitis: Secondary | ICD-10-CM

## 2019-07-15 DIAGNOSIS — D123 Benign neoplasm of transverse colon: Secondary | ICD-10-CM | POA: Diagnosis not present

## 2019-07-15 DIAGNOSIS — Z880 Allergy status to penicillin: Secondary | ICD-10-CM | POA: Insufficient documentation

## 2019-07-15 DIAGNOSIS — K635 Polyp of colon: Secondary | ICD-10-CM | POA: Diagnosis not present

## 2019-07-15 DIAGNOSIS — Z800 Family history of malignant neoplasm of digestive organs: Secondary | ICD-10-CM | POA: Diagnosis not present

## 2019-07-15 DIAGNOSIS — Z6841 Body Mass Index (BMI) 40.0 and over, adult: Secondary | ICD-10-CM | POA: Insufficient documentation

## 2019-07-15 DIAGNOSIS — D126 Benign neoplasm of colon, unspecified: Secondary | ICD-10-CM

## 2019-07-15 DIAGNOSIS — Z888 Allergy status to other drugs, medicaments and biological substances status: Secondary | ICD-10-CM | POA: Diagnosis not present

## 2019-07-15 DIAGNOSIS — K648 Other hemorrhoids: Secondary | ICD-10-CM | POA: Insufficient documentation

## 2019-07-15 DIAGNOSIS — Z1211 Encounter for screening for malignant neoplasm of colon: Secondary | ICD-10-CM

## 2019-07-15 HISTORY — PX: COLONOSCOPY WITH PROPOFOL: SHX5780

## 2019-07-15 HISTORY — PX: POLYPECTOMY: SHX5525

## 2019-07-15 SURGERY — COLONOSCOPY WITH PROPOFOL
Anesthesia: Monitor Anesthesia Care

## 2019-07-15 MED ORDER — PROPOFOL 10 MG/ML IV BOLUS
INTRAVENOUS | Status: DC | PRN
  Administered 2019-07-15: 30 mg via INTRAVENOUS
  Administered 2019-07-15: 20 mg via INTRAVENOUS

## 2019-07-15 MED ORDER — LIDOCAINE 2% (20 MG/ML) 5 ML SYRINGE
INTRAMUSCULAR | Status: DC | PRN
  Administered 2019-07-15: 100 mg via INTRAVENOUS

## 2019-07-15 MED ORDER — SODIUM CHLORIDE 0.9 % IV SOLN
INTRAVENOUS | Status: DC
  Administered 2019-07-15: 1000 mL via INTRAVENOUS

## 2019-07-15 MED ORDER — LACTATED RINGERS IV SOLN: INTRAVENOUS | Status: DC | PRN

## 2019-07-15 MED ORDER — ONDANSETRON HCL 4 MG/2ML IJ SOLN
INTRAMUSCULAR | Status: DC | PRN
  Administered 2019-07-15: 4 mg via INTRAVENOUS

## 2019-07-15 MED ORDER — GLYCOPYRROLATE PF 0.2 MG/ML IJ SOSY
PREFILLED_SYRINGE | INTRAMUSCULAR | Status: DC | PRN
  Administered 2019-07-15: .2 mg via INTRAVENOUS

## 2019-07-15 MED ORDER — PROPOFOL 10 MG/ML IV BOLUS
INTRAVENOUS | Status: AC
  Filled 2019-07-15: qty 20

## 2019-07-15 MED ORDER — PROPOFOL 500 MG/50ML IV EMUL
INTRAVENOUS | Status: DC | PRN
  Administered 2019-07-15: 100 ug/kg/min via INTRAVENOUS

## 2019-07-15 SURGICAL SUPPLY — 21 items

## 2019-07-15 NOTE — Op Note (Signed)
[facility] Patient Name: Barbara Kramer Procedure Date: 07/15/2019 MRN: [id number] Attending MD: Carlota Raspberry. Havery Moros , MD Date of Birth: Dec 17, 1950 CSN: [id number] Age: 69 Admit Type: Outpatient Procedure:                Colonoscopy Indications:              Screening in patient at increased risk: Family                            history of 1st-degree relative with colorectal                            cancer (mother age 20s) Providers:                Carlota Raspberry. Havery Moros, MD, Josie Dixon, RN,                            Cherylynn Ridges, Technician Referring MD:              Medicines:                Monitored Anesthesia Care Complications:            No immediate complications. Estimated blood loss:                            Minimal. Estimated Blood Loss:     Estimated blood loss was minimal. Procedure:                Pre-Anesthesia Assessment:                           - Prior to the procedure, a History and Physical                            was performed, and patient medications and                            allergies were reviewed. The patient's tolerance of                            previous anesthesia was also reviewed. The risks                            and benefits of the procedure and the sedation                            options and risks were discussed with the patient.                            All questions were answered, and informed consent                            was obtained. Prior Anticoagulants: The patient has                            taken no previous  anticoagulant or antiplatelet                            agents. ASA Grade Assessment: III - A patient with                            severe systemic disease. After reviewing the risks                            and benefits, the patient was deemed in                            satisfactory condition to undergo the procedure.                           After obtaining informed  consent, the colonoscope                            was passed under direct vision. Throughout the                            procedure, the patient's blood pressure, pulse, and                            oxygen saturations were monitored continuously. The                            PCF-H190DL ([id number] Olympus pediatric colonscope                            was introduced through the anus and advanced to the                            the cecum, identified by appendiceal orifice and                            ileocecal valve. The colonoscopy was performed                            without difficulty. The patient tolerated the                            procedure well. The quality of the bowel                            preparation was fair. The ileocecal valve,                            appendiceal orifice, and rectum were photographed. Scope In: 8:30:15 AM Scope Out: 9:10:13 AM Scope Withdrawal Time: 0 hours 29 minutes 23 seconds  Total Procedure Duration: 0 hours 39 minutes 58 seconds  Findings:      The perianal and digital rectal examinations were normal.      A 12 mm polyp was found in the hepatic flexure. The  polyp was sessile.       The polyp was removed with a cold snare. Resection and retrieval were       complete.      A 4 mm polyp was found in the recto-sigmoid colon. The polyp was       sessile. The polyp was removed with a cold snare. Resection and       retrieval were complete.      A large amount of liquid stool was found in the entire colon, making       visualization difficult. Lavage of the colon was performed using copious       amounts of sterile water, resulting in clearance with adequate       visualization in the majority of the colon. Cecum was hard to clear but       no obvious lesions noted following lavage. .      Internal hemorrhoids were found during retroflexion.      The exam was otherwise without abnormality. Impression:               - Preparation of  the colon was fair, extensive                            lavage performed to obtain mostly adequate views.                           - One 12 mm polyp at the hepatic flexure, removed                            with a cold snare. Resected and retrieved.                           - One 4 mm polyp at the recto-sigmoid colon,                            removed with a cold snare. Resected and retrieved.                           - Internal hemorrhoids.                           - The examination was otherwise normal. Moderate Sedation:      No moderate sedation, case performed with MAC Recommendation:           - Patient has a contact number available for                            emergencies. The signs and symptoms of potential                            delayed complications were discussed with the                            patient. Return to normal activities tomorrow.                            Written discharge instructions were provided to  the                            patient.                           - Resume previous diet.                           - Continue present medications.                           - Await pathology results with further                            recommendations. Procedure Code(s):        --- Professional ---                           [phone], Colonoscopy, flexible; with removal of                            tumor(s), polyp(s), or other lesion(s) by snare                            technique Diagnosis Code(s):        --- Professional ---                           Z80.0, Family history of malignant neoplasm of                            digestive organs                           K64.8, Other hemorrhoids                           K63.5, Polyp of colon CPT copyright 2019 American Medical Association. All rights reserved. The codes documented in this report are preliminary and upon coder review may  be revised to meet current compliance requirements. Remo Lipps  P. Alonte Wulff, MD 07/15/2019 9:18:48 AM This report has been signed electronically. Number of Addenda: 0

## 2019-07-15 NOTE — Anesthesia Postprocedure Evaluation (Signed)
Anesthesia Post Note  Patient: Barbara Kramer  Procedure(s) Performed: COLONOSCOPY WITH PROPOFOL (N/A ) POLYPECTOMY     Patient location during evaluation: Endoscopy Anesthesia Type: MAC Level of consciousness: awake and alert Pain management: pain level controlled Vital Signs Assessment: post-procedure vital signs reviewed and stable Respiratory status: spontaneous breathing, nonlabored ventilation, respiratory function stable and patient connected to nasal cannula oxygen Cardiovascular status: stable and blood pressure returned to baseline Postop Assessment: no apparent nausea or vomiting Anesthetic complications: no    Last Vitals:  Vitals:   07/15/19 0930 07/15/19 0940  BP: 124/64 134/71  Pulse: 84 89  Resp: 15 19  Temp:    SpO2: 100% 99%    Last Pain:  Vitals:   07/15/19 0940  TempSrc:   PainSc: 0-No pain                 Mehr Depaoli P Laquon Emel

## 2019-07-15 NOTE — Transfer of Care (Signed)
Immediate Anesthesia Transfer of Care Note  Patient: Barbara Kramer  Procedure(s) Performed: COLONOSCOPY WITH PROPOFOL (N/A ) POLYPECTOMY  Patient Location: Endoscopy Unit  Anesthesia Type:MAC  Level of Consciousness: awake, drowsy and patient cooperative  Airway & Oxygen Therapy: Patient Spontanous Breathing and Patient connected to face mask  Post-op Assessment: Report given to RN, Post -op Vital signs reviewed and stable and Patient moving all extremities X 4  Post vital signs: Reviewed and stable  Last Vitals:  Vitals Value Taken Time  BP    Temp    Pulse    Resp    SpO2      Last Pain:  Vitals:   07/15/19 0753  TempSrc: Oral  PainSc: 0-No pain         Complications: No apparent anesthesia complications

## 2019-07-15 NOTE — Discharge Instructions (Signed)
YOU HAD AN ENDOSCOPIC PROCEDURE TODAY: Refer to the procedure report and other information in the discharge instructions given to you for any specific questions about what was found during the examination. If this information does not answer your questions, please call [place] office at [phone] to clarify.  ° °YOU SHOULD EXPECT: Some feelings of bloating in the abdomen. Passage of more gas than usual. Walking can help get rid of the air that was put into your GI tract during the procedure and reduce the bloating. If you had a lower endoscopy (such as a colonoscopy or flexible sigmoidoscopy) you may notice spotting of blood in your stool or on the toilet paper. Some abdominal soreness may be present for a day or two, also. ° °DIET: Your first meal following the procedure should be a light meal and then it is ok to progress to your normal diet. A half-sandwich or bowl of soup is an example of a good first meal. Heavy or fried foods are harder to digest and may make you feel nauseous or bloated. Drink plenty of fluids but you should avoid alcoholic beverages for 24 hours. If you had a esophageal dilation, please see attached instructions for diet.   ° °ACTIVITY: Your care partner should take you home directly after the procedure. You should plan to take it easy, moving slowly for the rest of the day. You can resume normal activity the day after the procedure however YOU SHOULD NOT DRIVE, use power tools, machinery or perform tasks that involve climbing or major physical exertion for 24 hours (because of the sedation medicines used during the test).  ° °SYMPTOMS TO REPORT IMMEDIATELY: °A gastroenterologist can be reached at any hour. Please call [phone]  for any of the following symptoms:  °Following lower endoscopy (colonoscopy, flexible sigmoidoscopy) °Excessive amounts of blood in the stool  °Significant tenderness, worsening of abdominal pains  °Swelling of the abdomen that is new, acute  °Fever of 100° or  higher  °Following upper endoscopy (EGD, EUS, ERCP, esophageal dilation) °Vomiting of blood or coffee ground material  °New, significant abdominal pain  °New, significant chest pain or pain under the shoulder blades  °Painful or persistently difficult swallowing  °New shortness of breath  °Black, tarry-looking or red, bloody stools ° °FOLLOW UP:  °If any biopsies were taken you will be contacted by phone or by letter within the next 1-3 weeks. Call [phone]  if you have not heard about the biopsies in 3 weeks.  °Please also call with any specific questions about appointments or follow up tests. ° °

## 2019-07-15 NOTE — H&P (Signed)
HPI :  69 y/o female with morbid obesity - BMI - > 55 - strong family history of colon cancer, here for screening colonoscopy. Otherwise asymptomatic. No complaints today. See note from February for details.     Past Medical History:  Diagnosis Date  . Arthritis   . Breast lump    left  . Eczema   . Family hx of colon cancer   . Kidney cysts    small, right 10/2002  . Normal cardiac stress test 9/10   exercise stress test normal but limited by exercise intolerance  . Obesity      Past Surgical History:  Procedure Laterality Date  . CHOLECYSTECTOMY  05/2002   [place]- gallstones, small renal cyst 05/2002  . FOOT SURGERY  11/1998   bunion  . PARTIAL HYSTERECTOMY  1998   fibroids, ovaries remain   Family History  Problem Relation Age of Onset  . Colon cancer Mother   . Stomach cancer Mother   . Lung cancer Father        smoker  . Hypertension Sister   . Stroke Maternal Grandmother   . Stroke Paternal Grandmother   . Hypertension Sister   . Colon polyps Daughter    Social History   Tobacco Use  . Smoking status: Former Smoker    Quit date: 03/20/1976    Years since quitting: 43.3  . Smokeless tobacco: Never Used  Substance Use Topics  . Alcohol use: No    Alcohol/week: 0.0 standard drinks  . Drug use: No   Current Facility-Administered Medications  Medication Dose Route Frequency Provider Last Rate Last Admin  . 0.9 %  sodium chloride infusion   Intravenous Continuous Dereon Williamsen, Carlota Raspberry, MD 20 mL/hr at 07/15/19 0815 1,000 mL at 07/15/19 0815   Facility-Administered Medications Ordered in Other Encounters  Medication Dose Route Frequency Provider Last Rate Last Admin  . lactated ringers infusion   Intravenous Continuous PRN Mitzie Na, CRNA   New Bag at 07/15/19 [phone]   Allergies  Allergen Reactions  . Calcium Carbonate     REACTION: constipation  . Penicillins Rash    Has patient had a PCN reaction causing immediate rash, facial/tongue/throat swelling,  SOB or lightheadedness with hypotension: no Has patient had a PCN reaction causing severe rash involving mucus membranes or skin necrosis: no Has patient had a PCN reaction that required hospitalization: no Has patient had a PCN reaction occurring within the last 10 years: no If all of the above answers are "NO", then may proceed with Cephalosporin use.      Review of Systems: All systems reviewed and negative except where noted in HPI.    No results found.  Physical Exam: BP (!) 169/76   Pulse 78   Temp 98.2 F (36.8 C) (Oral)   Resp 15   Ht 5\' 2"  (1.575 m)   Wt (!) 137.9 kg   SpO2 100%   BMI 55.60 kg/m  Constitutional: Pleasant,well-developed, female in no acute distress. Cardiovascular: Normal rate, regular rhythm.  Pulmonary/chest: Effort normal and breath sounds normal.  Abdominal: Soft, nondistended, nontender.  Extremities: no edema Neurological: Alert and oriented to person place and time. Skin: Skin is warm and dry. No rashes noted. Psychiatric: Normal mood and affect. Behavior is normal.   ASSESSMENT AND PLAN: 69 y/o female with strong family history of colon cancer here for screening colonoscopy. BMI > 50, case done at the hospital for anesthesia support. I have discussed risks / benefits and she  wishes to proceed.   Enterprise Cellar, MD [facility]        No ref. provider found

## 2019-07-15 NOTE — Interval H&P Note (Signed)
History and Physical Interval Note:  07/15/2019 8:27 AM  Barbara Kramer  has presented today for surgery, with the diagnosis of FAMILY HISTORY OF COLON CANCER AND MORBID OBESITY.  The various methods of treatment have been discussed with the patient and family. After consideration of risks, benefits and other options for treatment, the patient has consented to  Procedure(s): COLONOSCOPY WITH PROPOFOL (N/A) as a surgical intervention.  The patient's history has been reviewed, patient examined, no change in status, stable for surgery.  I have reviewed the patient's chart and labs.  Questions were answered to the patient's satisfaction.     Barbara Kramer

## 2019-07-16 ENCOUNTER — Encounter: Payer: Self-pay | Admitting: *Deleted

## 2019-07-16 LAB — SURGICAL PATHOLOGY

## 2019-08-04 ENCOUNTER — Telehealth: Payer: Self-pay | Admitting: Family Medicine

## 2019-08-04 DIAGNOSIS — Z0279 Encounter for issue of other medical certificate: Secondary | ICD-10-CM

## 2019-08-04 NOTE — Telephone Encounter (Signed)
I left a message on patient's voice mail form is ready for pick up and $20 charge.

## 2019-08-04 NOTE — Telephone Encounter (Signed)
Pt dropped off disability parking placard form to be filled out. Placed in [place] tower.

## 2019-08-04 NOTE — Telephone Encounter (Signed)
Placed in Dr. Marliss Coots office.

## 2019-08-07 DIAGNOSIS — Z0100 Encounter for examination of eyes and vision without abnormal findings: Secondary | ICD-10-CM | POA: Diagnosis not present

## 2019-08-07 DIAGNOSIS — H524 Presbyopia: Secondary | ICD-10-CM | POA: Diagnosis not present

## 2019-11-07 ENCOUNTER — Ambulatory Visit: Payer: Medicare HMO | Admitting: Pulmonary Disease

## 2019-11-14 ENCOUNTER — Ambulatory Visit: Payer: Medicare HMO | Admitting: Pulmonary Disease

## 2019-11-17 ENCOUNTER — Encounter: Payer: Self-pay | Admitting: Adult Health

## 2019-11-17 ENCOUNTER — Telehealth: Payer: Self-pay | Admitting: Family Medicine

## 2019-11-17 ENCOUNTER — Other Ambulatory Visit: Payer: Self-pay

## 2019-11-17 ENCOUNTER — Ambulatory Visit: Payer: Medicare HMO | Admitting: Adult Health

## 2019-11-17 VITALS — BP 132/68 | HR 67 | Temp 97.9°F | Ht 62.0 in | Wt 301.4 lb

## 2019-11-17 DIAGNOSIS — G4733 Obstructive sleep apnea (adult) (pediatric): Secondary | ICD-10-CM

## 2019-11-17 NOTE — Telephone Encounter (Signed)
Ref done  Will route to [facility] 

## 2019-11-17 NOTE — Progress Notes (Signed)
Reviewed and agree with assessment/plan.   Tilman Mcclaren, MD [facility] 11/17/2019, 2:47 PM Pager:  [phone]  

## 2019-11-17 NOTE — Progress Notes (Signed)
@Patient  ID: Barbara Kramer, female    DOB: 06-05-1950, 69 y.o.   MRN: [id number]  Chief Complaint  Patient presents with  . Follow-up    OSA    Referring provider: Abner Greenspan, MD  HPI: 69 yo female followed for Sleep Apnea   TEST/EVENTS :  HST on 08/10/2017 showed an AHI of 27.  11/17/2019 Follow up: OSA  Patient presents for a follow-up visit for obstructive sleep apnea.  Patient was seen last in June 2019.  She has been diagnosed with moderate obstructive sleep apnea with previous home sleep study May 2019 showing AHI 27.  Patient has started CPAP.  However was unable to keep it due to high cost with poor insurance coverage.  Patient says that she has now switched insurances and her new insurance says they will cover CPAP.  She wants to restart it as she has ongoing daytime sleepiness, restless sleep, snoring.  She feels very tired during the day with low energy.  Never feels refreshed.  Patient's BMI is 55.       Allergies  Allergen Reactions  . Calcium Carbonate     REACTION: constipation  . Penicillins Rash    Has patient had a PCN reaction causing immediate rash, facial/tongue/throat swelling, SOB or lightheadedness with hypotension: no Has patient had a PCN reaction causing severe rash involving mucus membranes or skin necrosis: no Has patient had a PCN reaction that required hospitalization: no Has patient had a PCN reaction occurring within the last 10 years: no If all of the above answers are "NO", then may proceed with Cephalosporin use.     Immunization History  Administered Date(s) Administered  . Fluad Quad(high Dose 65+) 03/18/2019  . Influenza,inj,Quad PF,6+ Mos 02/21/2018  . PFIZER SARS-COV-2 Vaccination 06/06/2019, 06/27/2019  . Pneumococcal Conjugate-13 02/21/2018  . Pneumococcal Polysaccharide-23 03/18/2019  . Td 08/15/2006  . Tdap 04/22/2015    Past Medical History:  Diagnosis Date  . Arthritis   . Breast lump    left  . Eczema   .  Family hx of colon cancer   . Kidney cysts    small, right 10/2002  . Normal cardiac stress test 9/10   exercise stress test normal but limited by exercise intolerance  . Obesity     Tobacco History: Social History   Tobacco Use  Smoking Status Former Smoker  . Packs/day: 0.25  . Years: 5.00  . Pack years: 1.25  . Types: Cigarettes  . Quit date: 03/20/1976  . Years since quitting: 43.6  Smokeless Tobacco Never Used   Counseling given: Not Answered   No outpatient medications prior to visit.   No facility-administered medications prior to visit.     Review of Systems:   Constitutional:   No  weight loss, night sweats,  Fevers, chills,  +fatigue, or  lassitude.  HEENT:   No headaches,  Difficulty swallowing,  Tooth/dental problems, or  Sore throat,                No sneezing, itching, ear ache, nasal congestion, post nasal drip,   CV:  No chest pain,  Orthopnea, PND, swelling in lower extremities, anasarca, dizziness, palpitations, syncope.   GI  No heartburn, indigestion, abdominal pain, nausea, vomiting, diarrhea, change in bowel habits, loss of appetite, bloody stools.   Resp: No shortness of breath with exertion or at rest.  No excess mucus, no productive cough,  No non-productive cough,  No coughing up of blood.  No change  in color of mucus.  No wheezing.  No chest wall deformity  Skin: no rash or lesions.  GU: no dysuria, change in color of urine, no urgency or frequency.  No flank pain, no hematuria   MS:  No joint pain or swelling.  No decreased range of motion.  No back pain.    Physical Exam  BP 132/68 (BP Location: Left Arm, Cuff Size: Large)   Pulse 67   Temp 97.9 F (36.6 C) (Oral)   Ht 5\' 2"  (1.575 m)   Wt (!) 301 lb 6.4 oz (136.7 kg)   SpO2 99% Comment: RA  BMI 55.13 kg/m   GEN: A/Ox3; pleasant , NAD, well nourished , BMI 55    HEENT:  [place]/AT,  , NOSE-clear, THROAT-clear, no lesions, no postnasal drip or exudate noted.  Class II-III MP  airway  NECK:  Supple w/ fair ROM; no JVD; normal carotid impulses w/o bruits; no thyromegaly or nodules palpated; no lymphadenopathy.    RESP  Clear  P & A; w/o, wheezes/ rales/ or rhonchi. no accessory muscle use, no dullness to percussion  CARD:  RRR, no m/r/g, +tr peripheral edema, pulses intact, no cyanosis or clubbing.  GI:   Soft & nt; nml bowel sounds; no organomegaly or masses detected.   Musco: Warm bil, no deformities or joint swelling noted.   Neuro: alert, no focal deficits noted.    Skin: Warm, no lesions or rashes    Lab Results:  CBC  BNP No results found for: BNP  ProBNP No results found for: PROBNP  Imaging: No results found.    No flowsheet data found.  No results found for: NITRICOXIDE      Assessment & Plan:   OSA (obstructive sleep apnea)  Moderate obstructive sleep apnea with significant symptom burden with daytime sleepiness snoring and restless sleep.  Patient would greatly benefit from CPAP.  Sleep study is very recent in the last 2 years.  Would proceed with restarting CPAP.  As her new insurance as it will cover.  Order for auto CPAP 14 to 20 cm H2O.  Plan  Patient Instructions  Restart CPAP At bedtime   Wear all night (at least 4hrs or more)  Work on healthy weight  Do not drive if sleepy  Follow up with Dr. Halford Chessman  Or Jeliyah Middlebrooks in 2-3 months and As needed         Morbid obesity ([place]) Healthy weight loss     Rexene Edison, NP 11/17/2019

## 2019-11-17 NOTE — Assessment & Plan Note (Signed)
Healthy weight loss 

## 2019-11-17 NOTE — Patient Instructions (Signed)
Restart CPAP At bedtime   Wear all night (at least 4hrs or more)  Work on healthy weight  Do not drive if sleepy  Follow up with Dr. Halford Chessman  Or Zuleyka Kloc in 2-3 months and As needed

## 2019-11-17 NOTE — Telephone Encounter (Signed)
Referral Request - Has patient seen PCP for this complaint? Yes.   *If NO, is insurance requiring patient see PCP for this issue before PCP can refer them? Referral for which specialty: Pulmonary  Preferred provider/office: [facility] Reason for referral: Patient stated she saw PCP for this issue in 2019. But new insurance is requiring that PCP have referral placed for them to cover her appointment for today. Patient stated as long as one is placed insurance will cover. Please advise.

## 2019-11-17 NOTE — Assessment & Plan Note (Signed)
  Moderate obstructive sleep apnea with significant symptom burden with daytime sleepiness snoring and restless sleep.  Patient would greatly benefit from CPAP.  Sleep study is very recent in the last 2 years.  Would proceed with restarting CPAP.  As her new insurance as it will cover.  Order for auto CPAP 14 to 20 cm H2O.  Plan  Patient Instructions  Restart CPAP At bedtime   Wear all night (at least 4hrs or more)  Work on healthy weight  Do not drive if sleepy  Follow up with Dr. Halford Chessman  Or Zafirah Vanzee in 2-3 months and As needed

## 2019-11-17 NOTE — Telephone Encounter (Signed)
Referral sent to [facility]  Nothing further needed.

## 2019-11-19 ENCOUNTER — Encounter: Payer: Self-pay | Admitting: Family Medicine

## 2020-01-15 DIAGNOSIS — G4733 Obstructive sleep apnea (adult) (pediatric): Secondary | ICD-10-CM | POA: Diagnosis not present

## 2020-02-15 DIAGNOSIS — G4733 Obstructive sleep apnea (adult) (pediatric): Secondary | ICD-10-CM | POA: Diagnosis not present

## 2020-03-01 DIAGNOSIS — G4733 Obstructive sleep apnea (adult) (pediatric): Secondary | ICD-10-CM | POA: Diagnosis not present

## 2020-03-14 ENCOUNTER — Telehealth: Payer: Self-pay | Admitting: Family Medicine

## 2020-03-14 DIAGNOSIS — Z0000 Encounter for general adult medical examination without abnormal findings: Secondary | ICD-10-CM

## 2020-03-14 DIAGNOSIS — R7303 Prediabetes: Secondary | ICD-10-CM

## 2020-03-14 NOTE — Telephone Encounter (Signed)
-----   Message from Ellamae Sia sent at 03/02/2020  3:55 PM EST ----- Regarding: Lab orders for Monday, 12.27.21 Patient is scheduled for CPX labs, please order future labs, Thanks , Karna Christmas

## 2020-03-16 ENCOUNTER — Other Ambulatory Visit: Payer: Medicare HMO

## 2020-03-16 ENCOUNTER — Other Ambulatory Visit (INDEPENDENT_AMBULATORY_CARE_PROVIDER_SITE_OTHER): Payer: Medicare HMO

## 2020-03-16 ENCOUNTER — Other Ambulatory Visit: Payer: Self-pay

## 2020-03-16 DIAGNOSIS — Z0000 Encounter for general adult medical examination without abnormal findings: Secondary | ICD-10-CM | POA: Diagnosis not present

## 2020-03-16 DIAGNOSIS — R7303 Prediabetes: Secondary | ICD-10-CM | POA: Diagnosis not present

## 2020-03-16 DIAGNOSIS — G4733 Obstructive sleep apnea (adult) (pediatric): Secondary | ICD-10-CM | POA: Diagnosis not present

## 2020-03-16 LAB — CBC WITH DIFFERENTIAL/PLATELET
Basophils Absolute: 0 10*3/uL (ref 0.0–0.1)
Basophils Relative: 0.6 % (ref 0.0–3.0)
Eosinophils Absolute: 0.3 10*3/uL (ref 0.0–0.7)
Eosinophils Relative: 4 % (ref 0.0–5.0)
HCT: 40.1 % (ref 36.0–46.0)
Hemoglobin: 13.1 g/dL (ref 12.0–15.0)
Lymphocytes Relative: 26.9 % (ref 12.0–46.0)
Lymphs Abs: 2.3 10*3/uL (ref 0.7–4.0)
MCHC: 32.6 g/dL (ref 30.0–36.0)
MCV: 85.4 fl (ref 78.0–100.0)
Monocytes Absolute: 0.5 10*3/uL (ref 0.1–1.0)
Monocytes Relative: 6.1 % (ref 3.0–12.0)
Neutro Abs: 5.5 10*3/uL (ref 1.4–7.7)
Neutrophils Relative %: 62.4 % (ref 43.0–77.0)
Platelets: 256 10*3/uL (ref 150.0–400.0)
RBC: 4.7 Mil/uL (ref 3.87–5.11)
RDW: 14.8 % (ref 11.5–15.5)
WBC: 8.7 10*3/uL (ref 4.0–10.5)

## 2020-03-16 LAB — LIPID PANEL
Cholesterol: 163 mg/dL (ref 0–200)
HDL: 58.4 mg/dL (ref >39.00)
LDL Cholesterol: 92 mg/dL (ref 0–99)
NonHDL: 104.6
Total CHOL/HDL Ratio: 3
Triglycerides: 64 mg/dL (ref 0.0–149.0)
VLDL: 12.8 mg/dL (ref 0.0–40.0)

## 2020-03-16 LAB — COMPREHENSIVE METABOLIC PANEL
ALT: 11 U/L (ref 0–35)
AST: 14 U/L (ref 0–37)
Albumin: 4.3 g/dL (ref 3.5–5.2)
Alkaline Phosphatase: 69 U/L (ref 39–117)
BUN: 16 mg/dL (ref 6–23)
CO2: 27 mEq/L (ref 19–32)
Calcium: 10 mg/dL (ref 8.4–10.5)
Chloride: 101 mEq/L (ref 96–112)
Creatinine, Ser: 1.01 mg/dL (ref 0.40–1.20)
GFR: 57.72 mL/min — ABNORMAL LOW (ref >60.00)
Glucose, Bld: 91 mg/dL (ref 70–99)
Potassium: 4.2 mEq/L (ref 3.5–5.1)
Sodium: 135 mEq/L (ref 135–145)
Total Bilirubin: 0.4 mg/dL (ref 0.2–1.2)
Total Protein: 7.6 g/dL (ref 6.0–8.3)

## 2020-03-16 LAB — TSH: TSH: 2.25 u[IU]/mL (ref 0.35–4.50)

## 2020-03-17 LAB — HEMOGLOBIN A1C
Hgb A1c MFr Bld: 4.8 % of total Hgb (ref <5.7)
Mean Plasma Glucose: 91 mg/dL
eAG (mmol/L): 5 mmol/L

## 2020-03-23 ENCOUNTER — Encounter: Payer: Self-pay | Admitting: Family Medicine

## 2020-03-23 ENCOUNTER — Telehealth (INDEPENDENT_AMBULATORY_CARE_PROVIDER_SITE_OTHER): Payer: Medicare HMO | Admitting: Family Medicine

## 2020-03-23 DIAGNOSIS — J3489 Other specified disorders of nose and nasal sinuses: Secondary | ICD-10-CM

## 2020-03-23 DIAGNOSIS — R202 Paresthesia of skin: Secondary | ICD-10-CM | POA: Diagnosis not present

## 2020-03-23 NOTE — Assessment & Plan Note (Signed)
Pt started drinking more water to help with wt loss and does feel better (noted slt inc in GFR) Discussed how this problem influences overall health and the risks it imposes  Reviewed plan for weight loss with lower calorie diet (via better food choices and also portion control or program like weight watchers) and exercise building up to or more than 30 minutes 5 days per week including some aerobic activity   Will disc further at upcoming annual exam

## 2020-03-23 NOTE — Assessment & Plan Note (Signed)
Only at night in certain positions Not with reped movement at all  Suspect this is circulatory/positional  inst to change sleep position if possible  Will disc further at in office visit  Last labs rev

## 2020-03-23 NOTE — Patient Instructions (Signed)
The office will call to set up an appt for annual exam   Keep drinking more water!

## 2020-03-23 NOTE — Progress Notes (Signed)
Virtual Visit via Video Note  I connected with [name] on 03/23/20 at 10:30 AM EST by a video enabled telemedicine application and verified that I am speaking with the correct person using two identifiers.  Location: Patient: home Provider: office   I discussed the limitations of evaluation and management by telemedicine and the availability of in person appointments. The patient expressed understanding and agreed to proceed.  Parties involved in encounter  Patient:Barbara Kramer   Provider:  Roxy Manns MD   History of Present Illness: Pt presents with concerns re: hands and nose  Fingers feel numb on and off  (normally all fingers)  No pain or burning  Unsure if any tingling  About a month  When in bed or in recliner  No loss of strength   One day last week R wrist hurt   Exercising fingers helps   Uses cpap at night  (pref to sleep in recliner)   She feels like she has a knot in R nostril No pain/no throbbing  No congestion    Had some drainage a week ago-better now   Drinking more water since she saw her labs with GFR of 57.7 This may help her loose wt   Patient Active Problem List   Diagnosis Date Noted  . Hand paresthesia 03/23/2020  . Lesion of nostril 03/23/2020  . Family history of colon cancer in mother   . Benign neoplasm of colon   . Welcome to Medicare preventive visit 01/29/2018  . OSA (obstructive sleep apnea) 01/29/2018  . Estrogen deficiency 01/29/2018  . Allergic rhinitis 05/15/2017  . Screening mammogram, encounter for 10/11/2016  . Snoring 10/11/2016  . Prediabetes 12/15/2014  . Osteoarthritis of left knee 10/16/2013  . Left knee pain 10/15/2013  . Colon cancer screening 11/20/2012  . Routine gynecological examination 11/17/2011  . Other screening mammogram 11/17/2011  . Low back pain 10/18/2010  . Routine general medical examination at a health care facility 10/06/2010  . 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 Morbid obesity  (HCC) 08/02/2006  . Eczema 08/02/2006   Past Medical History:  Diagnosis Date  . Arthritis   . Breast lump    left  . Eczema   . Family hx of colon cancer   . Kidney cysts    small, right 10/2002  . Normal cardiac stress test 9/10   exercise stress test normal but limited by exercise intolerance  . Obesity    Past Surgical History:  Procedure Laterality Date  . CHOLECYSTECTOMY  05/2002   [place]- gallstones, small renal cyst 05/2002  . COLONOSCOPY WITH PROPOFOL N/A 07/15/2019   Procedure: COLONOSCOPY WITH PROPOFOL;  Surgeon: Benancio Deeds, MD;  Location: [facility];  Service: Gastroenterology;  Laterality: N/A;  . FOOT SURGERY  11/1998   bunion  . PARTIAL HYSTERECTOMY  1998   fibroids, ovaries remain  . POLYPECTOMY  07/15/2019   Procedure: POLYPECTOMY;  Surgeon: Benancio Deeds, MD;  Location: [facility];  Service: Gastroenterology;;   Social History   Tobacco Use  . Smoking status: Former Smoker    Packs/day: 0.25    Years: 5.00    Pack years: 1.25    Types: Cigarettes    Quit date: 03/20/1976    Years since quitting: 44.0  . Smokeless tobacco: Never Used  Substance Use Topics  . Alcohol use: No    Alcohol/week: 0.0 standard drinks  . Drug use: No   Family History  Problem Relation Age of Onset  . Colon cancer  Mother   . Stomach cancer Mother   . Lung cancer Father        smoker  . Hypertension Sister   . Stroke Maternal Grandmother   . Stroke Paternal Grandmother   . Hypertension Sister   . Colon polyps Daughter    Allergies  Allergen Reactions  . Calcium Carbonate     REACTION: constipation  . Penicillins Rash    Has patient had a PCN reaction causing immediate rash, facial/tongue/throat swelling, SOB or lightheadedness with hypotension: no Has patient had a PCN reaction causing severe rash involving mucus membranes or skin necrosis: no Has patient had a PCN reaction that required hospitalization: no Has patient had a PCN reaction occurring  within the last 10 years: no If all of the above answers are "NO", then may proceed with Cephalosporin use.    No current outpatient medications on file prior to visit.   No current facility-administered medications on file prior to visit.   Review of Systems  Constitutional: Negative for chills, fever and malaise/fatigue.  HENT: Negative for congestion, ear pain, sinus pain and sore throat.        Lump in R nostril   Eyes: Negative for blurred vision, discharge and redness.  Respiratory: Negative for cough, shortness of breath and stridor.   Cardiovascular: Negative for chest pain, palpitations and leg swelling.  Gastrointestinal: Negative for abdominal pain, diarrhea, nausea and vomiting.  Musculoskeletal: Negative for myalgias.  Skin: Negative for rash.  Neurological: Positive for sensory change. Negative for dizziness, tingling, tremors, focal weakness, seizures, weakness and headaches.       Numbness/fingers on and off  Psychiatric/Behavioral: The patient is not nervous/anxious.     Observations/Objective: Patient appears well, in no distress Weight is baseline  No facial swelling or asymmetry Normal voice-not hoarse and no slurred speech No obvious tremor or mobility impairment Moving neck and UEs normally Able to hear the call well  No cough or shortness of breath during interview  Talkative and mentally sharp with no cognitive changes No skin changes on face or neck , no rash or pallor Affect is normal    Assessment and Plan: Problem List Items Addressed This Visit      Other   Morbid obesity ([place])    Pt started drinking more water to help with wt loss and does feel better (noted slt inc in GFR) Discussed how this problem influences overall health and the risks it imposes  Reviewed plan for weight loss with lower calorie diet (via better food choices and also portion control or program like weight watchers) and exercise building up to or more than 30 minutes 5 days  per week including some aerobic activity   Will disc further at upcoming annual exam      Hand paresthesia - Primary    Only at night in certain positions Not with reped movement at all  Suspect this is circulatory/positional  inst to change sleep position if possible  Will disc further at in office visit  Last labs rev      Lesion of nostril    Pt notices a difference in R nostril- ? Like a knot  No pain  No congestion at all or rhinorrhea  Will f/u for in office visit to check  inst to alert if this enlarges or becomes painful          Follow Up Instructions: The office will call to set up an appt for annual exam   Keep drinking  more water!   I discussed the assessment and treatment plan with the patient. The patient was provided an opportunity to ask questions and all were answered. The patient agreed with the plan and demonstrated an understanding of the instructions.   The patient was advised to call back or seek an in-person evaluation if the symptoms worsen or if the condition fails to improve as anticipated.     Loura Pardon, MD

## 2020-03-23 NOTE — Assessment & Plan Note (Signed)
Pt notices a difference in R nostril- ? Like a knot  No pain  No congestion at all or rhinorrhea  Will f/u for in office visit to check  inst to alert if this enlarges or becomes painful

## 2020-04-16 DIAGNOSIS — G4733 Obstructive sleep apnea (adult) (pediatric): Secondary | ICD-10-CM | POA: Diagnosis not present

## 2020-04-28 ENCOUNTER — Other Ambulatory Visit: Payer: Self-pay

## 2020-04-28 ENCOUNTER — Ambulatory Visit (INDEPENDENT_AMBULATORY_CARE_PROVIDER_SITE_OTHER): Payer: Medicare HMO | Admitting: Family Medicine

## 2020-04-28 ENCOUNTER — Encounter: Payer: Self-pay | Admitting: Family Medicine

## 2020-04-28 VITALS — BP 152/78 | HR 79 | Temp 97.0°F | Ht 62.0 in | Wt 303.5 lb

## 2020-04-28 DIAGNOSIS — G8929 Other chronic pain: Secondary | ICD-10-CM

## 2020-04-28 DIAGNOSIS — M1712 Unilateral primary osteoarthritis, left knee: Secondary | ICD-10-CM

## 2020-04-28 DIAGNOSIS — M25562 Pain in left knee: Secondary | ICD-10-CM | POA: Diagnosis not present

## 2020-04-28 DIAGNOSIS — G4733 Obstructive sleep apnea (adult) (pediatric): Secondary | ICD-10-CM | POA: Diagnosis not present

## 2020-04-28 NOTE — Assessment & Plan Note (Signed)
Discussed how this problem influences overall health and the risks it imposes  Reviewed plan for weight loss with lower calorie diet (via better food choices and also portion control or program like weight watchers) and exercise building up to or more than 30 minutes 5 days per week including some aerobic activity   Exercise is limited with knee OA pain  Plans to try a pedaler to start  Low glycemic diet recommended

## 2020-04-28 NOTE — Assessment & Plan Note (Signed)
In the setting of morbid obesity  Rev xray from 7/15 and bone spurring noted Difficult to assess swelling or effusion on exam  rom is limited  Recommend tylenol and voltaren gel topical for symptoms  Ref made to orthopedics Trial of bike/pedaler for exercise may help along with weight loss

## 2020-04-28 NOTE — Progress Notes (Signed)
Subjective:    Patient ID: Barbara Kramer, female    DOB: 05-03-1950, 70 y.o.   MRN: [id number]  This visit occurred during the SARS-CoV-2 public health emergency.  Safety protocols were in place, including screening questions prior to the visit, additional usage of staff PPE, and extensive cleaning of exam room while observing appropriate contact time as indicated for disinfecting solutions.    HPI Pt presents with Left leg problems  Wt Readings from Last 3 Encounters:  04/28/20 (!) 303 lb 8 oz (137.7 kg)  11/17/19 (!) 301 lb 6.4 oz (136.7 kg)  07/15/19 (!) 304 lb 0.2 oz (137.9 kg)   55.51 kg/m  Pain in L leg intermittent for quite a while  It can be medial/lateral and posterior   Pain is sometimes sharp  Some dull ache (more constant)  Position wise- more comfortable when extended (in recliner) or propped up on side of desk  Hurts more to walk than to sit  Also more to go up/down steps   Low back bothers her also - this is bothered by change in gait from the knee problem  She leans to the left side    Knows she has arthritis in her left knee  Stiff in am also   Saw Dr Ninfa Linden -ortho years ago who recommended it   Feels heavier to lift (but not weak)  Had to put a lot of weight on it getting out of her car   Her car seat has a problem/not able to adjust it   Last L knee pain from 7/15  Narrative  CLINICAL DATA: Lateral knee pain   EXAM:  LEFT KNEE - COMPLETE 4+ VIEW   COMPARISON: None.   FINDINGS:  Medial and lateral compartmental marginal spurring. Minimal patellar  spurring. Suspected knee effusion. Body habitus reduces diagnostic  sensitivity and specificity.   Degenerative medial compartmental articular space narrowing.   IMPRESSION:  1. Tricompartmental spurring, most striking in the medial  compartment with there is also some articular space narrowing.  2. Small to moderate knee effusion.      Is interested in a pedaler   No otc med   No ice or heat   Patient Active Problem List   Diagnosis Date Noted  . Hand paresthesia 03/23/2020  . Lesion of nostril 03/23/2020  . Family history of colon cancer in mother   . Benign neoplasm of colon   . Welcome to Medicare preventive visit 01/29/2018  . OSA (obstructive sleep apnea) 01/29/2018  . Estrogen deficiency 01/29/2018  . Allergic rhinitis 05/15/2017  . Screening mammogram, encounter for 10/11/2016  . Snoring 10/11/2016  . Prediabetes 12/15/2014  . Osteoarthritis of left knee 10/16/2013  . Left knee pain 10/15/2013  . Colon cancer screening 11/20/2012  . Routine gynecological examination 11/17/2011  . Other screening mammogram 11/17/2011  . Low back pain 10/18/2010  . Routine general medical examination at a health care facility 10/06/2010  . 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 Morbid obesity ([place]) 08/02/2006  . Eczema 08/02/2006   Past Medical History:  Diagnosis Date  . Arthritis   . Breast lump    left  . Eczema   . Family hx of colon cancer   . Kidney cysts    small, right 10/2002  . Normal cardiac stress test 9/10   exercise stress test normal but limited by exercise intolerance  . Obesity    Past Surgical History:  Procedure Laterality Date  . CHOLECYSTECTOMY  05/2002   [place]-  gallstones, small renal cyst 05/2002  . COLONOSCOPY WITH PROPOFOL N/A 07/15/2019   Procedure: COLONOSCOPY WITH PROPOFOL;  Surgeon: Yetta Flock, MD;  Location: [facility];  Service: Gastroenterology;  Laterality: N/A;  . FOOT SURGERY  11/1998   bunion  . PARTIAL HYSTERECTOMY  1998   fibroids, ovaries remain  . POLYPECTOMY  07/15/2019   Procedure: POLYPECTOMY;  Surgeon: Yetta Flock, MD;  Location: [facility];  Service: Gastroenterology;;   Social History   Tobacco Use  . Smoking status: Former Smoker    Packs/day: 0.25    Years: 5.00    Pack years: 1.25    Types: Cigarettes    Quit date: 03/20/1976    Years since quitting: 44.1  . Smokeless tobacco:  Never Used  Substance Use Topics  . Alcohol use: No    Alcohol/week: 0.0 standard drinks  . Drug use: No   Family History  Problem Relation Age of Onset  . Colon cancer Mother   . Stomach cancer Mother   . Lung cancer Father        smoker  . Hypertension Sister   . Stroke Maternal Grandmother   . Stroke Paternal Grandmother   . Hypertension Sister   . Colon polyps Daughter    Allergies  Allergen Reactions  . Calcium Carbonate     REACTION: constipation  . Penicillins Rash    Has patient had a PCN reaction causing immediate rash, facial/tongue/throat swelling, SOB or lightheadedness with hypotension: no Has patient had a PCN reaction causing severe rash involving mucus membranes or skin necrosis: no Has patient had a PCN reaction that required hospitalization: no Has patient had a PCN reaction occurring within the last 10 years: no If all of the above answers are "NO", then may proceed with Cephalosporin use.    No current outpatient medications on file prior to visit.   No current facility-administered medications on file prior to visit.    Review of Systems  Constitutional: Negative for activity change, appetite change, fatigue, fever and unexpected weight change.  HENT: Negative for congestion, ear pain, rhinorrhea, sinus pressure and sore throat.   Eyes: Negative for pain, redness and visual disturbance.  Respiratory: Negative for cough, shortness of breath and wheezing.   Cardiovascular: Negative for chest pain and palpitations.  Gastrointestinal: Negative for abdominal pain, blood in stool, constipation and diarrhea.  Endocrine: Negative for polydipsia and polyuria.  Genitourinary: Negative for dysuria, frequency and urgency.  Musculoskeletal: Positive for arthralgias. Negative for back pain and myalgias.       Pain in knees- worse on L   Skin: Negative for pallor and rash.  Allergic/Immunologic: Negative for environmental allergies.  Neurological: Negative for  dizziness, syncope and headaches.  Hematological: Negative for adenopathy. Does not bruise/bleed easily.  Psychiatric/Behavioral: Negative for decreased concentration and dysphoric mood. The patient is not nervous/anxious.        Objective:   Physical Exam Constitutional:      General: She is not in acute distress.    Appearance: Normal appearance. She is obese. She is not ill-appearing.  Eyes:     General: No scleral icterus.    Conjunctiva/sclera: Conjunctivae normal.     Pupils: Pupils are equal, round, and reactive to light.  Cardiovascular:     Rate and Rhythm: Normal rate and regular rhythm.     Heart sounds: Normal heart sounds.  Pulmonary:     Effort: Pulmonary effort is normal. No respiratory distress.     Breath sounds: Normal  breath sounds.  Musculoskeletal:     Cervical back: Normal range of motion.     Comments: L knee Due to body habitus, difficult to assess for swelling or effusion  (fatty deposits med and laterally) No warmth to the touch  Mild crepitus  ROM:  Flex-90 deg with pain  Ext -full  [place]ray= some lateral pain  Bounce test -nl  Stability: Anterior drawer-not unstable Lachman exam   Tenderness -lateral joint line  Gait -slow/labored    Skin:    General: Skin is warm and dry.     Findings: No erythema or rash.  Neurological:     Mental Status: She is alert.     Sensory: No sensory deficit.     Coordination: Coordination normal.  Psychiatric:        Mood and Affect: Mood normal.           Assessment & Plan:   Problem List Items Addressed This Visit      Musculoskeletal and Integument   Osteoarthritis of left knee - Primary    In the setting of morbid obesity  Rev xray from 7/15 and bone spurring noted Difficult to assess swelling or effusion on exam  rom is limited  Recommend tylenol and voltaren gel topical for symptoms  Ref made to orthopedics Trial of bike/pedaler for exercise may help along with weight loss        Relevant Orders   Ambulatory referral to Orthopedic Surgery     Other   Morbid obesity ([place])    Discussed how this problem influences overall health and the risks it imposes  Reviewed plan for weight loss with lower calorie diet (via better food choices and also portion control or program like weight watchers) and exercise building up to or more than 30 minutes 5 days per week including some aerobic activity   Exercise is limited with knee OA pain  Plans to try a pedaler to start  Low glycemic diet recommended      Left knee pain

## 2020-04-28 NOTE — Patient Instructions (Signed)
Try the pedaling machine for exercise and strength   Ice for 10 minutes at a time may help knee Elevate when you can   Try voltaren (diclofenac) gel 1% over the counter on the knee Tylenol may help as well   The office will call to set up the orthopedic appointment

## 2020-05-04 ENCOUNTER — Encounter: Payer: Self-pay | Admitting: Family Medicine

## 2020-05-04 DIAGNOSIS — Z1231 Encounter for screening mammogram for malignant neoplasm of breast: Secondary | ICD-10-CM | POA: Diagnosis not present

## 2020-05-05 DIAGNOSIS — H524 Presbyopia: Secondary | ICD-10-CM | POA: Diagnosis not present

## 2020-05-10 ENCOUNTER — Encounter: Payer: Self-pay | Admitting: Family Medicine

## 2020-05-10 ENCOUNTER — Ambulatory Visit (INDEPENDENT_AMBULATORY_CARE_PROVIDER_SITE_OTHER): Payer: Medicare HMO

## 2020-05-10 ENCOUNTER — Ambulatory Visit: Payer: Medicare HMO | Admitting: Orthopaedic Surgery

## 2020-05-10 VITALS — Ht 62.0 in | Wt 303.5 lb

## 2020-05-10 DIAGNOSIS — G8929 Other chronic pain: Secondary | ICD-10-CM

## 2020-05-10 DIAGNOSIS — M25562 Pain in left knee: Secondary | ICD-10-CM

## 2020-05-10 DIAGNOSIS — Z6841 Body Mass Index (BMI) 40.0 and over, adult: Secondary | ICD-10-CM

## 2020-05-10 DIAGNOSIS — M1712 Unilateral primary osteoarthritis, left knee: Secondary | ICD-10-CM

## 2020-05-10 NOTE — Progress Notes (Signed)
Office Visit Note   Patient: Barbara Kramer           Date of Birth: Dec 28, 1950           MRN: [id number] Visit Date: 05/10/2020              Requested by: Tower, Wynelle Fanny, MD [place],  [place] [id number]   PCP: Abner Greenspan, MD   Assessment & Plan: Visit Diagnoses:  1. Chronic pain of left knee   2. Morbid obesity with body mass index (BMI) of 50.0 to 59.9 in adult ([place])   3. Unilateral primary osteoarthritis, left knee     Plan: I spoke to her in length about her left knee.  I I spoke to her about weight loss in detail.  I described what is going on with her knee.  We talked about conservative treatment measures including quad strengthening exercises as well as weight loss and potentially even steroid injections or hyaluronic acid injections.  For now, she would like to try some Voltaren gel and work on quad training exercises.  All question concerns were answered and addressed.  Follow-up can be as needed.  Follow-Up Instructions: Return if symptoms worsen or fail to improve.   Orders:  Orders Placed This Encounter  Procedures  . XR Knee 1-2 Views Left   No orders of the defined types were placed in this encounter.     Procedures: No procedures performed   Clinical Data: No additional findings.   Subjective: Chief Complaint  Patient presents with  . Left Knee - Pain  The patient is a very pleasant 70 year old female who comes in with left knee pain that has been getting worse in terms of pain for many years now.  She states both knees bother her but the left knee is worse.  It does not hurt every day but it is behind the knee that hurts.  Sometimes swells and spasms on her.  She says sometimes it is stiff enough that she cannot walk on her knee.  She is not diabetic.  She has never had surgery on her knees nor has she had any type of injections.  Her main comorbidity is that her BMI is 55.51.  She does try to use a cane occasionally when she  walks.  She has never injured the knees from what she is aware of.  HPI  Review of Systems She currently denies any headache, chest pain, shortness of breath, fever, chills, nausea, vomiting  Objective: Vital Signs: Ht 5\' 2"  (1.575 m)   Wt (!) 303 lb 8 oz (137.7 kg)   BMI 55.51 kg/m   Physical Exam She is alert and orient x3 and in no acute distress Ortho Exam Examination of her left knee shows mild medial joint line tenderness with no effusion.  There is a large soft tissue envelope around her left knee.  It is hard to get a ligamentous exam due to the size of her knee. Specialty Comments:  No specialty comments available.  Imaging: XR Knee 1-2 Views Left  Result Date: 05/10/2020 2 views left knee show moderate tricompartmental arthritis with varus malalignment and significant narrowing of the medial compartment.    PMFS History: Patient Active Problem List   Diagnosis Date Noted  . Unilateral primary osteoarthritis, left knee 05/10/2020  . Morbid obesity with body mass index (BMI) of 50.0 to 59.9 in adult [facility]) 05/10/2020  . Hand paresthesia 03/23/2020  . Lesion of  nostril 03/23/2020  . Family history of colon cancer in mother   . Benign neoplasm of colon   . Welcome to Medicare preventive visit 01/29/2018  . OSA (obstructive sleep apnea) 01/29/2018  . Estrogen deficiency 01/29/2018  . Allergic rhinitis 05/15/2017  . Screening mammogram, encounter for 10/11/2016  . Snoring 10/11/2016  . Prediabetes 12/15/2014  . Osteoarthritis of left knee 10/16/2013  . Left knee pain 10/15/2013  . Colon cancer screening 11/20/2012  . Routine gynecological examination 11/17/2011  . Other screening mammogram 11/17/2011  . Low back pain 10/18/2010  . Routine general medical examination at a health care facility 10/06/2010  . 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 Morbid obesity ([place]) 08/02/2006  . Eczema 08/02/2006   Past Medical History:  Diagnosis Date  . Arthritis   . Breast  lump    left  . Eczema   . Family hx of colon cancer   . Kidney cysts    small, right 10/2002  . Normal cardiac stress test 9/10   exercise stress test normal but limited by exercise intolerance  . Obesity     Family History  Problem Relation Age of Onset  . Colon cancer Mother   . Stomach cancer Mother   . Lung cancer Father        smoker  . Hypertension Sister   . Stroke Maternal Grandmother   . Stroke Paternal Grandmother   . Hypertension Sister   . Colon polyps Daughter     Past Surgical History:  Procedure Laterality Date  . CHOLECYSTECTOMY  05/2002   [place]- gallstones, small renal cyst 05/2002  . COLONOSCOPY WITH PROPOFOL N/A 07/15/2019   Procedure: COLONOSCOPY WITH PROPOFOL;  Surgeon: Yetta Flock, MD;  Location: [facility];  Service: Gastroenterology;  Laterality: N/A;  . FOOT SURGERY  11/1998   bunion  . PARTIAL HYSTERECTOMY  1998   fibroids, ovaries remain  . POLYPECTOMY  07/15/2019   Procedure: POLYPECTOMY;  Surgeon: Yetta Flock, MD;  Location: [facility];  Service: Gastroenterology;;   Social History   Occupational History    Employer: [employer]  Tobacco Use  . Smoking status: Former Smoker    Packs/day: 0.25    Years: 5.00    Pack years: 1.25    Types: Cigarettes    Quit date: 03/20/1976    Years since quitting: 44.1  . Smokeless tobacco: Never Used  Substance and Sexual Activity  . Alcohol use: No    Alcohol/week: 0.0 standard drinks  . Drug use: No  . Sexual activity: Not Currently

## 2020-05-14 ENCOUNTER — Encounter: Payer: Self-pay | Admitting: Family Medicine

## 2020-05-14 ENCOUNTER — Ambulatory Visit (INDEPENDENT_AMBULATORY_CARE_PROVIDER_SITE_OTHER): Payer: Medicare HMO | Admitting: Family Medicine

## 2020-05-14 ENCOUNTER — Other Ambulatory Visit: Payer: Self-pay

## 2020-05-14 VITALS — BP 128/70 | HR 63 | Temp 96.9°F | Ht 61.25 in | Wt 299.1 lb

## 2020-05-14 DIAGNOSIS — R0602 Shortness of breath: Secondary | ICD-10-CM | POA: Insufficient documentation

## 2020-05-14 DIAGNOSIS — Z23 Encounter for immunization: Secondary | ICD-10-CM

## 2020-05-14 DIAGNOSIS — R7303 Prediabetes: Secondary | ICD-10-CM | POA: Diagnosis not present

## 2020-05-14 DIAGNOSIS — Z800 Family history of malignant neoplasm of digestive organs: Secondary | ICD-10-CM | POA: Diagnosis not present

## 2020-05-14 DIAGNOSIS — Z0000 Encounter for general adult medical examination without abnormal findings: Secondary | ICD-10-CM

## 2020-05-14 DIAGNOSIS — Z1211 Encounter for screening for malignant neoplasm of colon: Secondary | ICD-10-CM | POA: Diagnosis not present

## 2020-05-14 DIAGNOSIS — Z6841 Body Mass Index (BMI) 40.0 and over, adult: Secondary | ICD-10-CM

## 2020-05-14 NOTE — Patient Instructions (Addendum)
Flu shot today   I do recommend a covid booster COVID-19 Vaccine Information can be found at: ShippingScam.co.uk For questions related to vaccine distribution or appointments, please email [email or web address] or call [phone].    If you are interested in the new shingles vaccine (Shingrix) - call your local pharmacy to check on coverage and availability  If affordable, get on a wait list at your pharmacy to get the vaccine.  Try to get 1200-1500 mg of calcium per day with at least 1000 iu of vitamin D - for bone health   Get debrox over the counter- use as directed for ear wax   Labs look stable   Go forward with the weight clinic as planned

## 2020-05-14 NOTE — Progress Notes (Signed)
Subjective:    Patient ID: Barbara Kramer, female    DOB: [birth year], 70 y.o.   MRN: [id number]  This visit occurred during the SARS-CoV-2 public health emergency.  Safety protocols were in place, including screening questions prior to the visit, additional usage of staff PPE, and extensive cleaning of exam room while observing appropriate contact time as indicated for disinfecting solutions.    HPI Pt presents for amw and health mt exam   I have personally reviewed the Medicare Annual Wellness questionnaire and have noted 1. The patient's medical and social history 2. Their use of alcohol, tobacco or illicit drugs 3. Their current medications and supplements 4. The patient's functional ability including ADL's, fall risks, home safety risks and hearing or visual             impairment. 5. Diet and physical activities 6. Evidence for depression or mood disorders  The patients weight, height, BMI have been recorded in the chart and visual acuity is per eye clinic.  I have made referrals, counseling and provided education to the patient based review of the above and I have provided the pt with a written personalized care plan for preventive services. Reviewed and updated provider list, see scanned forms.  See scanned forms.  Routine anticipatory guidance given to patient.  See health maintenance. Colon cancer screening colonoscopy 4/21   fam hx colon cancer in mother Breast cancer screening mammogram last week [facility] and normal (pending report) Self breast exam-no lumps Flu vaccine -today  Tetanus vaccine Tdap 2/17 Pneumovax completed  Zoster vaccine--interested  covid vaccinated pending booster Dexa 12/20 -normal Falls-none Fractures-none  Supplements-none  Exercise -none recently , planning to get a pedaler   Advance directive-utd  Cognitive function addressed- see scanned forms- and if abnormal then additional documentation follows.  No concerns , able to handle own  affairs without problem  PMH and SH reviewed  Meds, vitals, and allergies reviewed.   ROS: See HPI.  Otherwise negative.    Weight : Wt Readings from Last 3 Encounters:  05/14/20 299 lb 2 oz (135.7 kg)  05/10/20 (!) 303 lb 8 oz (137.7 kg)  04/28/20 (!) 303 lb 8 oz (137.7 kg)   56.06 kg/m Drinking water and down 3 lbs  Diet is fair - but still eats fried foods  [facility]   A sharp fleeting pain occurred in chest one day when she was driving  Would like to see a cardiologist  Some sob with exertion    Hearing/vision:  Hearing Screening   125Hz  250Hz  500Hz  1000Hz  2000Hz  3000Hz  4000Hz  6000Hz  8000Hz   Right ear:   40 40 40  40    Left ear:   0 40 0  0    Vision Screening Comments: [facility] in [place] in Feb 2022 (eye exam)  Pt state ear canals are small  No problems that she knows of -hears well   Care team Endora Teresi-pcp [facility]-ortho Parrett NP-pulm  [facility]- GI    BP Readings from Last 3 Encounters:  05/14/20 128/70  04/28/20 (!) 152/78  11/17/19 132/68   Pulse Readings from Last 3 Encounters:  05/14/20 63  04/28/20 79  11/17/19 67    Lab Results  Component Value Date   CREATININE 1.01 03/16/2020   BUN 16 03/16/2020   NA 135 03/16/2020   K 4.2 03/16/2020   CL 101 03/16/2020   CO2 27 03/16/2020   Prediabetes Lab Results  Component Value Date   HGBA1C 4.8 03/16/2020   Cholesterol Lab Results  Component Value Date   CHOL 163 03/16/2020   CHOL 143 03/11/2019   CHOL 151 01/22/2018   Lab Results  Component Value Date   HDL 58.40 03/16/2020   HDL 53.00 03/11/2019   HDL 53.40 01/22/2018   Lab Results  Component Value Date   LDLCALC 92 03/16/2020   LDLCALC 80 03/11/2019   LDLCALC 83 01/22/2018   Lab Results  Component Value Date   TRIG 64.0 03/16/2020   TRIG 49.0 03/11/2019   TRIG 70.0 01/22/2018   Lab Results  Component Value Date   CHOLHDL 3 03/16/2020   CHOLHDL 3 03/11/2019    CHOLHDL 3 01/22/2018   No results found for: LDLDIRECT  Lab Results  Component Value Date   WBC 8.7 03/16/2020   HGB 13.1 03/16/2020   HCT 40.1 03/16/2020   MCV 85.4 03/16/2020   PLT 256.0 03/16/2020   Lab Results  Component Value Date   TSH 2.25 03/16/2020    Lab Results  Component Value Date   ALT 11 03/16/2020   AST 14 03/16/2020   ALKPHOS 69 03/16/2020   BILITOT 0.4 03/16/2020     Patient Active Problem List   Diagnosis Date Noted  . Medicare annual wellness visit, subsequent 05/14/2020  . SOB (shortness of breath) on exertion 05/14/2020  . Unilateral primary osteoarthritis, left knee 05/10/2020  . Morbid obesity with body mass index (BMI) of 50.0 to 59.9 in adult [facility]) 05/10/2020  . Hand paresthesia 03/23/2020  . Family history of colon cancer in mother   . Benign neoplasm of colon   . Welcome to Medicare preventive visit 01/29/2018  . OSA (obstructive sleep apnea) 01/29/2018  . Estrogen deficiency 01/29/2018  . Allergic rhinitis 05/15/2017  . Screening mammogram, encounter for 10/11/2016  . Snoring 10/11/2016  . Prediabetes 12/15/2014  . Osteoarthritis of left knee 10/16/2013  . Left knee pain 10/15/2013  . Colon cancer screening 11/20/2012  . Routine gynecological examination 11/17/2011  . Other screening mammogram 11/17/2011  . Low back pain 10/18/2010  . Routine general medical examination at a health care facility 10/06/2010  . 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 Eczema 08/02/2006   Past Medical History:  Diagnosis Date  . Arthritis   . Breast lump    left  . Eczema   . Family hx of colon cancer   . Kidney cysts    small, right 10/2002  . Normal cardiac stress test 9/10   exercise stress test normal but limited by exercise intolerance  . Obesity    Past Surgical History:  Procedure Laterality Date  . CHOLECYSTECTOMY  05/2002   [place]- gallstones, small renal cyst 05/2002  . COLONOSCOPY WITH PROPOFOL N/A 07/15/2019   Procedure: COLONOSCOPY WITH  PROPOFOL;  Surgeon: Yetta Flock, MD;  Location: [facility];  Service: Gastroenterology;  Laterality: N/A;  . FOOT SURGERY  11/1998   bunion  . PARTIAL HYSTERECTOMY  1998   fibroids, ovaries remain  . POLYPECTOMY  07/15/2019   Procedure: POLYPECTOMY;  Surgeon: Yetta Flock, MD;  Location: [facility];  Service: Gastroenterology;;   Social History   Tobacco Use  . Smoking status: Former Smoker    Packs/day: 0.25    Years: 5.00    Pack years: 1.25    Types: Cigarettes    Quit date: 03/20/1976    Years since quitting: 44.1  . Smokeless tobacco: Never Used  Substance Use Topics  . Alcohol  use: No    Alcohol/week: 0.0 standard drinks  . Drug use: No   Family History  Problem Relation Age of Onset  . Colon cancer Mother   . Stomach cancer Mother   . Lung cancer Father        smoker  . Hypertension Sister   . Dementia Sister   . Stroke Maternal Grandmother   . Stroke Paternal Grandmother   . Hypertension Sister   . Colon polyps Daughter    Allergies  Allergen Reactions  . Calcium Carbonate     REACTION: constipation  . Penicillins Rash    Has patient had a PCN reaction causing immediate rash, facial/tongue/throat swelling, SOB or lightheadedness with hypotension: no Has patient had a PCN reaction causing severe rash involving mucus membranes or skin necrosis: no Has patient had a PCN reaction that required hospitalization: no Has patient had a PCN reaction occurring within the last 10 years: no If all of the above answers are "NO", then may proceed with Cephalosporin use.    No current outpatient medications on file prior to visit.   No current facility-administered medications on file prior to visit.    Review of Systems  Constitutional: Positive for fatigue. Negative for activity change, appetite change, fever and unexpected weight change.  HENT: Negative for congestion, ear pain, rhinorrhea, sinus pressure and sore throat.   Eyes: Negative for pain,  redness and visual disturbance.  Respiratory: Positive for shortness of breath. Negative for cough, chest tightness and wheezing.   Cardiovascular: Negative for chest pain and palpitations.  Gastrointestinal: Negative for abdominal pain, blood in stool, constipation and diarrhea.  Endocrine: Negative for polydipsia and polyuria.  Genitourinary: Negative for dysuria, frequency and urgency.  Musculoskeletal: Positive for arthralgias. Negative for back pain and myalgias.  Skin: Negative for pallor and rash.  Allergic/Immunologic: Negative for environmental allergies.  Neurological: Negative for dizziness, syncope and headaches.  Hematological: Negative for adenopathy. Does not bruise/bleed easily.  Psychiatric/Behavioral: Negative for decreased concentration and dysphoric mood. The patient is not nervous/anxious.        Objective:   Physical Exam Constitutional:      General: She is not in acute distress.    Appearance: Normal appearance. She is well-developed. She is obese. She is not ill-appearing or diaphoretic.  HENT:     Head: Normocephalic and atraumatic.     Right Ear: Tympanic membrane, ear canal and external ear normal.     Left Ear: Tympanic membrane, ear canal and external ear normal.     Nose: Nose normal. No congestion.     Mouth/Throat:     Mouth: Mucous membranes are moist.     Pharynx: Oropharynx is clear. No posterior oropharyngeal erythema.  Eyes:     General: No scleral icterus.    Extraocular Movements: Extraocular movements intact.     Conjunctiva/sclera: Conjunctivae normal.     Pupils: Pupils are equal, round, and reactive to light.  Neck:     Thyroid: No thyromegaly.     Vascular: No carotid bruit or JVD.  Cardiovascular:     Rate and Rhythm: Normal rate and regular rhythm.     Pulses: Normal pulses.     Heart sounds: Normal heart sounds. No gallop.   Pulmonary:     Effort: Pulmonary effort is normal. No respiratory distress.     Breath sounds: Normal  breath sounds. No wheezing.     Comments: Good air exch Chest:     Chest wall: No tenderness.  Abdominal:  General: Bowel sounds are normal. There is no distension or abdominal bruit.     Palpations: Abdomen is soft. There is no mass.     Tenderness: There is no abdominal tenderness.     Hernia: No hernia is present.  Genitourinary:    Comments: Breast exam: No mass, nodules, thickening, tenderness, bulging, retraction, inflamation, nipple discharge or skin changes noted.  No axillary or clavicular LA.     Musculoskeletal:        General: No tenderness. Normal range of motion.     Cervical back: Normal range of motion and neck supple. No rigidity. No muscular tenderness.     Right lower leg: No edema.     Left lower leg: No edema.     Comments: No kyphosis   Lymphadenopathy:     Cervical: No cervical adenopathy.  Skin:    General: Skin is warm and dry.     Coloration: Skin is not pale.     Findings: No erythema or rash.     Comments: Few skin tags  Neurological:     Mental Status: She is alert. Mental status is at baseline.     Cranial Nerves: No cranial nerve deficit.     Motor: No abnormal muscle tone.     Coordination: Coordination normal.     Gait: Gait normal.     Deep Tendon Reflexes: Reflexes are normal and symmetric. Reflexes normal.  Psychiatric:        Mood and Affect: Mood normal.        Cognition and Memory: Cognition and memory normal.           Assessment & Plan:   Problem List Items Addressed This Visit      Other   Routine general medical examination at a health care facility    Reviewed health habits including diet and exercise and skin cancer prevention Reviewed appropriate screening tests for age  Also reviewed health mt list, fam hx and immunization status , as well as social and family history   See HPI Labs reviewed  Pending mammogram report frm last week Flu vaccine given  Considering covid booster, encouraged to get it  Nl dexa 12/20   No falls or fx  inst to take ca and D and exercise as tolerated Advance directive is utd  No cognitive concerns  [facility] Hearing screen affected by cerumen impaction, adv re: tx of this utd eye/vision care       Relevant Orders   Flu Vaccine QUAD High Dose(Fluad) (Completed)   Colon cancer screening    Colonoscopy done 4/21  Family hx of colon cancer in mother       Prediabetes    Lab Results  Component Value Date   HGBA1C 4.8 03/16/2020   Reassuring disc imp of low glycemic diet and wt loss to prevent DM2  Wt loss plan discussed      Family history of colon cancer in mother    Colonoscopy utd 4/21      Morbid obesity with body mass index (BMI) of 50.0 to 59.9 in adult [facility])    Discussed how this problem influences overall health and the risks it imposes  Reviewed plan for weight loss with lower calorie diet (via better food choices and also portion control or program like weight watchers) and exercise building up to or more than 30 minutes 5 days per week including some aerobic activity   [facility] visit  [facility]  Interested in knee replacement (this req wt loss for safe surgery)      Medicare annual wellness visit, subsequent - Primary    Reviewed health habits including diet and exercise and skin cancer prevention Reviewed appropriate screening tests for age  Also reviewed health mt list, fam hx and immunization status , as well as social and family history   See HPI Labs reviewed  Pending mammogram report frm last week Flu vaccine given  Considering covid booster, encouraged to get it  Nl dexa 12/20  No falls or fx  inst to take ca and D and exercise as tolerated Advance directive is utd  No cognitive concerns  Planning visit to [facility] Hearing screen affected by cerumen impaction, adv re: tx of this utd eye/vision care        SOB  (shortness of breath) on exertion    In part due to morbid obesity and deconditioning Pt desires cardiology eval as well  One episode of fleeting cp at rest  Ref done to cardiology      Relevant Orders   Ambulatory referral to Cardiology   RESOLVED: Morbid obesity ([place])    Discussed how this problem influences overall health and the risks it imposes  Reviewed plan for weight loss with lower calorie diet (via better food choices and also portion control or program like weight watchers) and exercise building up to or more than 30 minutes 5 days per week including some aerobic activity   Planning a visit to the [facility]       Other Visit Diagnoses    Need for influenza vaccination       Relevant Orders   Flu Vaccine QUAD High Dose(Fluad) (Completed)

## 2020-05-15 ENCOUNTER — Encounter: Payer: Self-pay | Admitting: Family Medicine

## 2020-05-15 NOTE — Assessment & Plan Note (Signed)
Reviewed health habits including diet and exercise and skin cancer prevention Reviewed appropriate screening tests for age  Also reviewed health mt list, fam hx and immunization status , as well as social and family history   See HPI Labs reviewed  Pending mammogram report frm last week Flu vaccine given  Considering covid booster, encouraged to get it  Nl dexa 12/20  No falls or fx  inst to take ca and D and exercise as tolerated Advance directive is utd  No cognitive concerns  Planning visit to [facility] obesity Hearing screen affected by cerumen impaction, adv re: tx of this utd eye/vision care

## 2020-05-15 NOTE — Assessment & Plan Note (Signed)
Colonoscopy utd 4/21

## 2020-05-15 NOTE — Assessment & Plan Note (Signed)
Lab Results  Component Value Date   HGBA1C 4.8 03/16/2020   Reassuring disc imp of low glycemic diet and wt loss to prevent DM2  Wt loss plan discussed

## 2020-05-15 NOTE — Assessment & Plan Note (Signed)
Discussed how this problem influences overall health and the risks it imposes  Reviewed plan for weight loss with lower calorie diet (via better food choices and also portion control or program like weight watchers) and exercise building up to or more than 30 minutes 5 days per week including some aerobic activity   Planning visit to [facility]  Interested in knee replacement (this req wt loss for safe surgery)

## 2020-05-15 NOTE — Assessment & Plan Note (Signed)
In part due to morbid obesity and deconditioning Pt desires cardiology eval as well  One episode of fleeting cp at rest  Ref done to cardiology

## 2020-05-15 NOTE — Assessment & Plan Note (Signed)
Colonoscopy done 4/21  Family hx of colon cancer in mother

## 2020-05-15 NOTE — Assessment & Plan Note (Signed)
Discussed how this problem influences overall health and the risks it imposes  Reviewed plan for weight loss with lower calorie diet (via better food choices and also portion control or program like weight watchers) and exercise building up to or more than 30 minutes 5 days per week including some aerobic activity   Planning a visit to the [facility]

## 2020-05-17 ENCOUNTER — Encounter: Payer: Self-pay | Admitting: Cardiology

## 2020-05-17 ENCOUNTER — Other Ambulatory Visit: Payer: Self-pay

## 2020-05-17 ENCOUNTER — Ambulatory Visit: Payer: Medicare HMO | Admitting: Cardiology

## 2020-05-17 VITALS — BP 144/62 | HR 86 | Ht 63.0 in | Wt 301.0 lb

## 2020-05-17 DIAGNOSIS — R0600 Dyspnea, unspecified: Secondary | ICD-10-CM

## 2020-05-17 DIAGNOSIS — R072 Precordial pain: Secondary | ICD-10-CM

## 2020-05-17 DIAGNOSIS — G4733 Obstructive sleep apnea (adult) (pediatric): Secondary | ICD-10-CM | POA: Diagnosis not present

## 2020-05-17 DIAGNOSIS — R079 Chest pain, unspecified: Secondary | ICD-10-CM

## 2020-05-17 DIAGNOSIS — R0609 Other forms of dyspnea: Secondary | ICD-10-CM

## 2020-05-17 NOTE — Progress Notes (Signed)
Cardiology Office Note:    Date:  05/17/2020   ID:  Barbara Kramer, DOB June 07, 1950, MRN [id number]  PCP:  Abner Greenspan, MD   [place]  Cardiologist:  Kate Sable, MD  Advanced Practice Provider:  No care team member to display Electrophysiologist:  None       Referring MD: Abner Greenspan, MD   Chief Complaint  Patient presents with  . Other    C/o sharp chest pain with exertion and SOB. Meds reviewed verbally with pt.    History of Present Illness:    Barbara Kramer is a 70 y.o. female with a hx of obesity who presents due to dyspnea on exertion and chest pain.  She had an episode of chest discomfort about 2 weeks ago after being frustrated or anxious when she could not find her current case.  Symptoms lasted a few seconds and then resolved.  Has not had any further episodes since.  Endorsed having shortness of breath especially with exertion for some time now which she attributes to her weight.  Denies any history of hypertension, hyperlipidemia.  Her blood pressures are usually well controlled at home.  Denies any immediate family history of heart disease.  Past Medical History:  Diagnosis Date  . Arthritis   . Breast lump    left  . Eczema   . Family hx of colon cancer   . Kidney cysts    small, right 10/2002  . Normal cardiac stress test 9/10   exercise stress test normal but limited by exercise intolerance  . Obesity     Past Surgical History:  Procedure Laterality Date  . CHOLECYSTECTOMY  05/2002   [place]- gallstones, small renal cyst 05/2002  . COLONOSCOPY WITH PROPOFOL N/A 07/15/2019   Procedure: COLONOSCOPY WITH PROPOFOL;  Surgeon: Yetta Flock, MD;  Location: [facility];  Service: Gastroenterology;  Laterality: N/A;  . FOOT SURGERY  11/1998   bunion  . PARTIAL HYSTERECTOMY  1998   fibroids, ovaries remain  . POLYPECTOMY  07/15/2019   Procedure: POLYPECTOMY;  Surgeon: Yetta Flock, MD;  Location: [facility];  Service: Gastroenterology;;    Current Medications: No outpatient medications have been marked as taking for the 05/17/20 encounter (Office Visit) with Kate Sable, MD.     Allergies:   Calcium carbonate and Penicillins   Social History   Socioeconomic History  . Marital status: Single    Spouse name: Not on file  . Number of children: 2  . Years of education: Not on file  . Highest education level: Not on file  Occupational History    Employer: [employer]  Tobacco Use  . Smoking status: Former Smoker    Packs/day: 0.25    Years: 5.00    Pack years: 1.25    Types: Cigarettes    Quit date: 03/20/1976    Years since quitting: 44.1  . Smokeless tobacco: Never Used  Substance and Sexual Activity  . Alcohol use: No    Alcohol/week: 0.0 standard drinks  . Drug use: No  . Sexual activity: Not Currently  Other Topics Concern  . Not on file  Social History Narrative  . Not on file   Social Determinants of Health   Financial Resource Strain: Not on file  Food Insecurity: Not on file  Transportation Needs: Not on file  Physical Activity: Not on file  Stress: Not on file  Social Connections: Not on file     Family  History: The patient's family history includes Colon cancer in her mother; Colon polyps in her daughter; Dementia in her sister; Heart attack in her maternal grandfather; Hypertension in her sister and sister; Lung cancer in her father; Stomach cancer in her mother; Stroke in her maternal grandmother and paternal grandmother.  ROS:   Please see the history of present illness.     All other systems reviewed and are negative.  EKGs/Labs/Other Studies Reviewed:    The following studies were reviewed today:   EKG:  EKG is  ordered today.  The ekg ordered today demonstrates normal sinus rhythm  Recent Labs: 03/16/2020: ALT 11; BUN 16; Creatinine, Ser 1.01; Hemoglobin 13.1; Platelets 256.0; Potassium 4.2; Sodium 135; TSH 2.25  Recent  Lipid Panel    Component Value Date/Time   CHOL 163 03/16/2020 1147   TRIG 64.0 03/16/2020 1147   HDL 58.40 03/16/2020 1147   CHOLHDL 3 03/16/2020 1147   VLDL 12.8 03/16/2020 1147   LDLCALC 92 03/16/2020 1147     Risk Assessment/Calculations:      Physical Exam:    VS:  BP (!) 144/62 (BP Location: Right Arm, Patient Position: Sitting, Cuff Size: Large)   Pulse 86   Ht 5\' 3"  (1.6 m)   Wt (!) 301 lb (136.5 kg)   SpO2 99%   BMI 53.32 kg/m     Wt Readings from Last 3 Encounters:  05/17/20 (!) 301 lb (136.5 kg)  05/14/20 299 lb 2 oz (135.7 kg)  05/10/20 (!) 303 lb 8 oz (137.7 kg)     GEN:  Well nourished, well developed in no acute distress HEENT: Normal NECK: No JVD; No carotid bruits LYMPHATICS: No lymphadenopathy CARDIAC: RRR, no murmurs, rubs, gallops RESPIRATORY:  Clear to auscultation without rales, wheezing or rhonchi  ABDOMEN: Soft, non-tender, distended MUSCULOSKELETAL:  No edema; No deformity  SKIN: Warm and dry NEUROLOGIC:  Alert and oriented x 3 PSYCHIATRIC:  Normal affect   ASSESSMENT:    1. Precordial pain   2. Dyspnea on exertion   3. Morbid obesity ([place])   4. Chest pain, unspecified type    PLAN:    In order of problems listed above:  1. Chest pain, and shortness of breath with exertion.  Obtain Lexiscan Myoview. 2. Dyspnea on exertion, get echocardiogram, Lexiscan Myoview as above.  Obesity/deconditioning could be contributing to patient's symptoms. 3. Patient is morbidly obese, low-calorie diet, weight loss advised.  Follow-up after echo and Myoview.   Shared Decision Making/Informed Consent The risks [chest pain, shortness of breath, cardiac arrhythmias, dizziness, blood pressure fluctuations, myocardial infarction, stroke/transient ischemic attack, nausea, vomiting, allergic reaction, radiation exposure, metallic taste sensation and life-threatening complications (estimated to be 1 in 10,000)], benefits (risk stratification, diagnosing  coronary artery disease, treatment guidance) and alternatives of a nuclear stress test were discussed in detail with Barbara Kramer and she agrees to proceed.   Medication Adjustments/Labs and Tests Ordered: Current medicines are reviewed at length with the patient today.  Concerns regarding medicines are outlined above.  Orders Placed This Encounter  Procedures  . NM Myocar Multi W/Spect W/Wall Motion / EF  . EKG 12-Lead  . ECHOCARDIOGRAM COMPLETE   No orders of the defined types were placed in this encounter.   Patient Instructions  Medication Instructions:  Your physician recommends that you continue on your current medications as directed. Please refer to the Current Medication list given to you today.  *If you need a refill on your cardiac medications before your next appointment, please  call your pharmacy*    Testing/Procedures: Your physician has requested that you have an echocardiogram. Echocardiography is a painless test that uses sound waves to create images of your heart. It provides your doctor with information about the size and shape of your heart and how well your heart's chambers and valves are working. This procedure takes approximately one hour. There are no restrictions for this procedure.  [place]  Your caregiver has ordered a Lexicographer. The purpose of this test is to evaluate the blood supply to your heart muscle. This procedure is referred to as a "Non-Invasive Stress Test." This is because other than having an IV started in your vein, nothing is inserted or "invades" your body. Cardiac stress tests are done to find areas of poor blood flow to the heart by determining the extent of coronary artery disease (CAD). Some patients exercise on a treadmill, which naturally increases the blood flow to your heart, while others who are  unable to walk on a treadmill due to physical limitations have a pharmacologic/chemical stress agent called Lexiscan . This medicine  will mimic walking on a treadmill by temporarily increasing your coronary blood flow.   Please note: these test may take anywhere between 2-4 hours to complete  PLEASE REPORT TO [place] AT THE FIRST DESK WILL DIRECT YOU WHERE TO GO  Date of Procedure:_____________________________________  Arrival Time for Procedure:______________________________  Instructions regarding medication:   ____ : Hold diabetes medication morning of procedure  ____:  Hold betablocker(s) night before procedure and morning of procedure  ____:  Hold other medications as follows:_________________________________________________________________________________________________________________________________________________________________________________________________________________________________________________________________________________________  PLEASE NOTIFY THE OFFICE AT LEAST 24 HOURS IN ADVANCE IF YOU ARE UNABLE TO KEEP YOUR APPOINTMENT.  [phone] AND  PLEASE NOTIFY NUCLEAR MEDICINE AT [facility] AT LEAST 24 HOURS IN ADVANCE IF YOU ARE UNABLE TO KEEP YOUR APPOINTMENT. [phone]  How to prepare for your Myoview test:  4. Do not eat or drink after midnight 5. No caffeine for 24 hours prior to test 6. No smoking 24 hours prior to test. 7. Your medication may be taken with water.  If your doctor stopped a medication because of this test, do not take that medication. 8. Ladies, please do not wear dresses.  Skirts or pants are appropriate. Please wear a short sleeve shirt. 9. No perfume, cologne or lotion. 10. Wear comfortable walking shoes. No heels!     Follow-Up: At [facility], you and your health needs are our priority.  As part of our continuing mission to provide you with exceptional heart care, we have created designated Provider Care Teams.  These Care Teams include your primary Cardiologist (physician) and Advanced Practice Providers (APPs -   Physician Assistants and Nurse Practitioners) who all work together to provide you with the care you need, when you need it.  We recommend signing up for the patient portal called "MyChart".  Sign up information is provided on this After Visit Summary.  MyChart is used to connect with patients for Virtual Visits (Telemedicine).  Patients are able to view lab/test results, encounter notes, upcoming appointments, etc.  Non-urgent messages can be sent to your provider as well.   To learn more about what you can do with MyChart, go to [email or web address].    Your next appointment:   After testing  The format for your next appointment:   In Person  Provider:   You may see Kate Sable, MD or one of the following Advanced Practice Providers  on your designated Care Team:    Murray Hodgkins, NP  Christell Faith, PA-C  Marrianne Mood, PA-C  Cadence Allen, [place]  Laurann Montana, NP       Signed, Kate Sable, MD  05/17/2020 4:03 PM    [place]

## 2020-05-17 NOTE — Patient Instructions (Addendum)
Medication Instructions:  Your physician recommends that you continue on your current medications as directed. Please refer to the Current Medication list given to you today.  *If you need a refill on your cardiac medications before your next appointment, please call your pharmacy*    Testing/Procedures: Your physician has requested that you have an echocardiogram. Echocardiography is a painless test that uses sound waves to create images of your heart. It provides your doctor with information about the size and shape of your heart and how well your heart's chambers and valves are working. This procedure takes approximately one hour. There are no restrictions for this procedure.  [place]  Your caregiver has ordered a Lexicographer. The purpose of this test is to evaluate the blood supply to your heart muscle. This procedure is referred to as a "Non-Invasive Stress Test." This is because other than having an IV started in your vein, nothing is inserted or "invades" your body. Cardiac stress tests are done to find areas of poor blood flow to the heart by determining the extent of coronary artery disease (CAD). Some patients exercise on a treadmill, which naturally increases the blood flow to your heart, while others who are  unable to walk on a treadmill due to physical limitations have a pharmacologic/chemical stress agent called Lexiscan . This medicine will mimic walking on a treadmill by temporarily increasing your coronary blood flow.   Please note: these test may take anywhere between 2-4 hours to complete  PLEASE REPORT TO [place] AT THE FIRST DESK WILL DIRECT YOU WHERE TO GO  Date of Procedure:_____________________________________  Arrival Time for Procedure:______________________________  Instructions regarding medication:   ____ : Hold diabetes medication morning of procedure  ____:  Hold betablocker(s) night before procedure and morning  of procedure  ____:  Hold other medications as follows:_________________________________________________________________________________________________________________________________________________________________________________________________________________________________________________________________________________________  PLEASE NOTIFY THE OFFICE AT LEAST 24 HOURS IN ADVANCE IF YOU ARE UNABLE TO KEEP YOUR APPOINTMENT.  [phone] AND  PLEASE NOTIFY NUCLEAR MEDICINE AT [facility] AT LEAST 24 HOURS IN ADVANCE IF YOU ARE UNABLE TO KEEP YOUR APPOINTMENT. [phone]  How to prepare for your Myoview test:  1. Do not eat or drink after midnight 2. No caffeine for 24 hours prior to test 3. No smoking 24 hours prior to test. 4. Your medication may be taken with water.  If your doctor stopped a medication because of this test, do not take that medication. 5. Ladies, please do not wear dresses.  Skirts or pants are appropriate. Please wear a short sleeve shirt. 6. No perfume, cologne or lotion. 7. Wear comfortable walking shoes. No heels!     Follow-Up: At [facility], you and your health needs are our priority.  As part of our continuing mission to provide you with exceptional heart care, we have created designated Provider Care Teams.  These Care Teams include your primary Cardiologist (physician) and Advanced Practice Providers (APPs -  Physician Assistants and Nurse Practitioners) who all work together to provide you with the care you need, when you need it.  We recommend signing up for the patient portal called "MyChart".  Sign up information is provided on this After Visit Summary.  MyChart is used to connect with patients for Virtual Visits (Telemedicine).  Patients are able to view lab/test results, encounter notes, upcoming appointments, etc.  Non-urgent messages can be sent to your provider as well.   To learn more about what you can do with MyChart, go to  [email or web address].    Your next appointment:   After testing  The format for your next appointment:   In Person  Provider:   You may see Kate Sable, MD or one of the following Advanced Practice Providers on your designated Care Team:    Murray Hodgkins, NP  Christell Faith, PA-C  Marrianne Mood, PA-C  Cadence Riple[place], [place]  Laurann Montana, NP

## 2020-05-20 ENCOUNTER — Encounter (INDEPENDENT_AMBULATORY_CARE_PROVIDER_SITE_OTHER): Payer: Self-pay | Admitting: Family Medicine

## 2020-05-20 ENCOUNTER — Ambulatory Visit (INDEPENDENT_AMBULATORY_CARE_PROVIDER_SITE_OTHER): Payer: Medicare HMO | Admitting: Family Medicine

## 2020-05-20 ENCOUNTER — Other Ambulatory Visit: Payer: Self-pay

## 2020-05-20 VITALS — BP 156/77 | HR 68 | Temp 97.4°F | Ht 62.0 in | Wt 300.0 lb

## 2020-05-20 DIAGNOSIS — Z1331 Encounter for screening for depression: Secondary | ICD-10-CM

## 2020-05-20 DIAGNOSIS — R5383 Other fatigue: Secondary | ICD-10-CM

## 2020-05-20 DIAGNOSIS — R0602 Shortness of breath: Secondary | ICD-10-CM | POA: Diagnosis not present

## 2020-05-20 DIAGNOSIS — R030 Elevated blood-pressure reading, without diagnosis of hypertension: Secondary | ICD-10-CM | POA: Diagnosis not present

## 2020-05-20 DIAGNOSIS — E559 Vitamin D deficiency, unspecified: Secondary | ICD-10-CM | POA: Diagnosis not present

## 2020-05-20 DIAGNOSIS — R739 Hyperglycemia, unspecified: Secondary | ICD-10-CM | POA: Diagnosis not present

## 2020-05-20 DIAGNOSIS — Z6841 Body Mass Index (BMI) 40.0 and over, adult: Secondary | ICD-10-CM | POA: Diagnosis not present

## 2020-05-20 DIAGNOSIS — G4733 Obstructive sleep apnea (adult) (pediatric): Secondary | ICD-10-CM

## 2020-05-20 DIAGNOSIS — Z0289 Encounter for other administrative examinations: Secondary | ICD-10-CM

## 2020-05-21 ENCOUNTER — Ambulatory Visit (INDEPENDENT_AMBULATORY_CARE_PROVIDER_SITE_OTHER): Payer: Medicare HMO

## 2020-05-21 DIAGNOSIS — R0600 Dyspnea, unspecified: Secondary | ICD-10-CM

## 2020-05-21 DIAGNOSIS — R079 Chest pain, unspecified: Secondary | ICD-10-CM | POA: Diagnosis not present

## 2020-05-21 DIAGNOSIS — R0609 Other forms of dyspnea: Secondary | ICD-10-CM

## 2020-05-21 DIAGNOSIS — R072 Precordial pain: Secondary | ICD-10-CM | POA: Diagnosis not present

## 2020-05-21 LAB — ECHOCARDIOGRAM COMPLETE
AR max vel: 2.09 cm2
AV Area VTI: 2.04 cm2
AV Area mean vel: 2.08 cm2
AV Mean grad: 5 mmHg
AV Peak grad: 9.4 mmHg
Ao pk vel: 1.53 m/s
Area-P 1/2: 3.48 cm2
S' Lateral: 2.8 cm

## 2020-05-21 LAB — VITAMIN D 25 HYDROXY (VIT D DEFICIENCY, FRACTURES): Vit D, 25-Hydroxy: 10.9 ng/mL — ABNORMAL LOW (ref 30.0–100.0)

## 2020-05-21 LAB — T3: T3, Total: 102 ng/dL (ref 71–180)

## 2020-05-21 LAB — INSULIN, RANDOM: INSULIN: 6.5 u[IU]/mL (ref 2.6–24.9)

## 2020-05-21 LAB — T4: T4, Total: 7.1 ug/dL (ref 4.5–12.0)

## 2020-05-24 ENCOUNTER — Telehealth: Payer: Self-pay | Admitting: Cardiology

## 2020-05-24 ENCOUNTER — Telehealth: Payer: Self-pay | Admitting: *Deleted

## 2020-05-24 NOTE — Telephone Encounter (Signed)
Reviewed results with patient and she verbalized understanding with no further questions at this time.  

## 2020-05-24 NOTE — Telephone Encounter (Signed)
See other telephone encounter. Results reviewed with patient.

## 2020-05-24 NOTE — Telephone Encounter (Signed)
Please call to discuss echocardiogram results.  

## 2020-05-24 NOTE — Telephone Encounter (Signed)
Left voicemail message to call back for review of results.  

## 2020-05-24 NOTE — Telephone Encounter (Signed)
-----   Message from Kate Sable, MD sent at 05/24/2020 12:13 PM EST ----- Normal systolic function, no findings to suggest etiology of shortness of breath.  Mild mitral regurgitation.

## 2020-05-28 ENCOUNTER — Encounter: Admission: RE | Admit: 2020-05-28 | Payer: Medicare HMO | Source: Ambulatory Visit

## 2020-05-28 ENCOUNTER — Telehealth: Payer: Self-pay | Admitting: Cardiology

## 2020-05-28 NOTE — Telephone Encounter (Signed)
Patient cancelled stress test and has decided against it for now.  Patient wants provider to be aware .  She will keep fu visit and discuss this and echo results.

## 2020-05-31 DIAGNOSIS — G4733 Obstructive sleep apnea (adult) (pediatric): Secondary | ICD-10-CM | POA: Diagnosis not present

## 2020-06-03 ENCOUNTER — Other Ambulatory Visit: Payer: Self-pay

## 2020-06-03 ENCOUNTER — Telehealth (INDEPENDENT_AMBULATORY_CARE_PROVIDER_SITE_OTHER): Payer: Self-pay | Admitting: Family Medicine

## 2020-06-03 ENCOUNTER — Ambulatory Visit (INDEPENDENT_AMBULATORY_CARE_PROVIDER_SITE_OTHER): Payer: Medicare HMO | Admitting: Family Medicine

## 2020-06-03 ENCOUNTER — Encounter (INDEPENDENT_AMBULATORY_CARE_PROVIDER_SITE_OTHER): Payer: Self-pay | Admitting: Family Medicine

## 2020-06-03 VITALS — BP 154/74 | HR 68 | Temp 97.9°F | Ht 62.0 in | Wt 287.0 lb

## 2020-06-03 DIAGNOSIS — Z6841 Body Mass Index (BMI) 40.0 and over, adult: Secondary | ICD-10-CM | POA: Diagnosis not present

## 2020-06-03 DIAGNOSIS — E559 Vitamin D deficiency, unspecified: Secondary | ICD-10-CM | POA: Diagnosis not present

## 2020-06-03 DIAGNOSIS — R7303 Prediabetes: Secondary | ICD-10-CM

## 2020-06-03 MED ORDER — VITAMIN D (ERGOCALCIFEROL) 1.25 MG (50000 UNIT) PO CAPS: 50000.0000 [IU] | ORAL_CAPSULE | ORAL | 0 refills | Status: DC

## 2020-06-03 NOTE — Telephone Encounter (Signed)
Patient forgot to mention to Dr Jearld Shines that she is seeing a Cardiologist and has some concerns with taking Vitamin D.

## 2020-06-04 ENCOUNTER — Ambulatory Visit (INDEPENDENT_AMBULATORY_CARE_PROVIDER_SITE_OTHER): Payer: Medicare HMO | Admitting: Cardiology

## 2020-06-04 ENCOUNTER — Encounter: Payer: Self-pay | Admitting: Cardiology

## 2020-06-04 VITALS — BP 124/64 | HR 60 | Ht 62.0 in | Wt 291.0 lb

## 2020-06-04 DIAGNOSIS — I5189 Other ill-defined heart diseases: Secondary | ICD-10-CM | POA: Diagnosis not present

## 2020-06-04 DIAGNOSIS — R072 Precordial pain: Secondary | ICD-10-CM

## 2020-06-04 NOTE — Patient Instructions (Signed)

## 2020-06-04 NOTE — Progress Notes (Signed)
Cardiology Office Note:    Date:  06/04/2020   ID:  Barbara Kramer, DOB 09/26/1950, MRN [id number]  PCP:  Abner Greenspan, MD   [place]  Cardiologist:  Kate Sable, MD  Advanced Practice Provider:  No care team member to display Electrophysiologist:  None       Referring MD: Abner Greenspan, MD   Chief Complaint  Patient presents with  . Other    Follow up post ECHO - Patient c/o SOB when walking a lot. Meds reviewed verbally with patient.     History of Present Illness:    Barbara Kramer is a 70 y.o. female with a hx of obesity who presents for follow-up.  She was last seen due to dyspnea on exertion and chest pain.  Chest pain was associated with an anxious incident.  Denies having any further symptoms of chest pain.  Blood pressures typically controlled at home.  Echo and Lexiscan Myoview were ordered due to patient's symptoms.  Patient declined Myoview, she did have echocardiogram performed.  She is follows up with nutritional/weight loss program in [place], has lost 12 pounds over the past 2 weeks.  Is planning on changing her diet and eating healthier.  Past Medical History:  Diagnosis Date  . Anemia   . Arthritis   . Breast lump    left  . Eczema   . Family hx of colon cancer   . Gallbladder problem   . Kidney cysts    small, right 10/2002  . Normal cardiac stress test 9/10   exercise stress test normal but limited by exercise intolerance  . Obesity   . Shortness of breath   . Sleep apnea     Past Surgical History:  Procedure Laterality Date  . CHOLECYSTECTOMY  05/2002   [place]- gallstones, small renal cyst 05/2002  . COLONOSCOPY WITH PROPOFOL N/A 07/15/2019   Procedure: COLONOSCOPY WITH PROPOFOL;  Surgeon: Yetta Flock, MD;  Location: [facility];  Service: Gastroenterology;  Laterality: N/A;  . FOOT SURGERY  11/1998   bunion  . PARTIAL HYSTERECTOMY  1998   fibroids, ovaries remain  . POLYPECTOMY  07/15/2019    Procedure: POLYPECTOMY;  Surgeon: Yetta Flock, MD;  Location: [facility];  Service: Gastroenterology;;    Current Medications: Current Meds  Medication Sig  . Vitamin D, Ergocalciferol, (DRISDOL) 1.25 MG (50000 UNIT) CAPS capsule Take 1 capsule (50,000 Units total) by mouth every 7 (seven) days.     Allergies:   Calcium carbonate and Penicillins   Social History   Socioeconomic History  . Marital status: Single    Spouse name: Not on file  . Number of children: 2  . Years of education: Not on file  . Highest education level: Not on file  Occupational History  . Occupation: Retired    Fish farm manager: [employer]  Tobacco Use  . Smoking status: Former Smoker    Packs/day: 0.25    Years: 5.00    Pack years: 1.25    Types: Cigarettes    Quit date: 03/20/1976    Years since quitting: 44.2  . Smokeless tobacco: Never Used  Substance and Sexual Activity  . Alcohol use: No    Alcohol/week: 0.0 standard drinks  . Drug use: No  . Sexual activity: Not Currently  Other Topics Concern  . Not on file  Social History Narrative  . Not on file   Social Determinants of Health   Financial Resource Strain: Not on  file  Food Insecurity: Not on file  Transportation Needs: Not on file  Physical Activity: Not on file  Stress: Not on file  Social Connections: Not on file     Family History: The patient's family history includes Cancer in her father and mother; Colon cancer in her mother; Colon polyps in her daughter; Dementia in her sister; Heart attack in her maternal grandfather; Hypertension in her sister and sister; Lung cancer in her father; Stomach cancer in her mother; Stroke in her maternal grandmother and paternal grandmother.  ROS:   Please see the history of present illness.     All other systems reviewed and are negative.  EKGs/Labs/Other Studies Reviewed:    The following studies were reviewed today:   EKG:  EKG is  ordered today.  The ekg ordered today  demonstrates normal sinus rhythm  Recent Labs: 03/16/2020: ALT 11; BUN 16; Creatinine, Ser 1.01; Hemoglobin 13.1; Platelets 256.0; Potassium 4.2; Sodium 135; TSH 2.25  Recent Lipid Panel    Component Value Date/Time   CHOL 163 03/16/2020 1147   TRIG 64.0 03/16/2020 1147   HDL 58.40 03/16/2020 1147   CHOLHDL 3 03/16/2020 1147   VLDL 12.8 03/16/2020 1147   LDLCALC 92 03/16/2020 1147     Risk Assessment/Calculations:      Physical Exam:    VS:  BP 124/64 (BP Location: Left Arm, Patient Position: Sitting, Cuff Size: Large)   Pulse 60   Ht 5\' 2"  (1.575 m)   Wt 291 lb (132 kg)   SpO2 98%   BMI 53.22 kg/m     Wt Readings from Last 3 Encounters:  06/04/20 291 lb (132 kg)  06/03/20 287 lb (130.2 kg)  05/20/20 300 lb (136.1 kg)     GEN:  Well nourished, well developed in no acute distress HEENT: Normal NECK: No JVD; No carotid bruits LYMPHATICS: No lymphadenopathy CARDIAC: RRR, no murmurs, rubs, gallops RESPIRATORY:  Clear to auscultation without rales, wheezing or rhonchi  ABDOMEN: Soft, non-tender, distended MUSCULOSKELETAL:  No edema; No deformity  SKIN: Warm and dry NEUROLOGIC:  Alert and oriented x 3 PSYCHIATRIC:  Normal affect   ASSESSMENT:    1. Precordial pain   2. Diastolic dysfunction   3. Morbid obesity ([place])    PLAN:    In order of problems listed above:  1. Chest pain, and shortness of breath with exertion.  Echo showed normal systolic function, mild LVH, grade 2 diastolic dysfunction, patient declined obtaining Lexiscan Myoview.  No further episodes of chest pain 2. Dyspnea on exertion, echo with preserved EF, grade 2 diastolic dysfunction, likely from being morbidly obese.  Appears euvolemic.  Weight loss, graduated exercising advised. 3. Patient is morbidly obese, congratulated on losing weight, encouraged to continue with weight loss program.   Follow-up in 6 months.     Medication Adjustments/Labs and Tests Ordered: Current medicines are  reviewed at length with the patient today.  Concerns regarding medicines are outlined above.  No orders of the defined types were placed in this encounter.  No orders of the defined types were placed in this encounter.   Patient Instructions  Medication Instructions:  Your physician recommends that you continue on your current medications as directed. Please refer to the Current Medication list given to you today.   *If you need a refill on your cardiac medications before your next appointment, please call your pharmacy*   Lab Work: None ordered If you have labs (blood work) drawn today and your tests are completely  normal, you will receive your results only by: Marland Kitchen MyChart Message (if you have MyChart) OR . A paper copy in the mail If you have any lab test that is abnormal or we need to change your treatment, we will call you to review the results.   Testing/Procedures: None ordered   Follow-Up: At [facility], you and your health needs are our priority.  As part of our continuing mission to provide you with exceptional heart care, we have created designated Provider Care Teams.  These Care Teams include your primary Cardiologist (physician) and Advanced Practice Providers (APPs -  Physician Assistants and Nurse Practitioners) who all work together to provide you with the care you need, when you need it.  We recommend signing up for the patient portal called "MyChart".  Sign up information is provided on this After Visit Summary.  MyChart is used to connect with patients for Virtual Visits (Telemedicine).  Patients are able to view lab/test results, encounter notes, upcoming appointments, etc.  Non-urgent messages can be sent to your provider as well.   To learn more about what you can do with MyChart, go to [email or web address].    Your next appointment:   6 month(s)  The format for your next appointment:   In Person  Provider:   Kate Sable, MD   Other  Instructions      Signed, Kate Sable, MD  06/04/2020 4:38 PM    [place]

## 2020-06-07 NOTE — Progress Notes (Signed)
Chief Complaint:   OBESITY Barbara Kramer is here to discuss her progress with her obesity treatment plan along with follow-up of her obesity related diagnoses. Barbara Kramer is on the Category 1 Plan and states she is following her eating plan approximately 90% of the time. Barbara Kramer states she is doing 0 minutes 0 times per week.  Today's visit was #: 2 Starting weight: 300 lbs Starting date: 05/20/2020 Today's weight: 287 lbs Today's date: 06/03/2020 Total lbs lost to date: 13 lbs Total lbs lost since last in-office visit: 13 lbs  Interim History: Barbara Kramer is really enjoying the meal plan. She was only hungry 1 day but realized that she was accidentally doing microwave dinners instead of dinner meal. She has no obstacles in the next few weeks. She went off the plan 1 time for family breakfast. She ate crackers for snack calories.  Subjective:   1. Vitamin D deficiency Barbara Kramer is not in Vit D supplementation. She reports fatigue.   2. Pre-diabetes Barbara Kramer's last A1c was 4.8 on 03/16/2020, and her last insulin level was 6.5 on 05/20/2020.  Lab Results  Component Value Date   HGBA1C 4.8 03/16/2020   Lab Results  Component Value Date   INSULIN 6.5 05/20/2020    Assessment/Plan:   1. Vitamin D deficiency Low Vitamin D level contributes to fatigue and are associated with obesity, breast, and colon cancer. She agrees to start to take prescription Vitamin D @50 ,000 IU every week and will follow-up for routine testing of Vitamin D, at least 2-3 times per year to avoid over-replacement.  - Vitamin D, Ergocalciferol, (DRISDOL) 1.25 MG (50000 UNIT) CAPS capsule; Take 1 capsule (50,000 Units total) by mouth every 7 (seven) days.  Dispense: 4 capsule; Refill: 0  2. Pre-diabetes Barbara Kramer will continue to work on weight loss, exercise, and decreasing simple carbohydrates to help decrease the risk of diabetes. Follow up labs in 2 weeks.  3. Class 3 severe obesity with serious comorbidity and  body mass index (BMI) of 50.0 to 59.9 in adult, unspecified obesity type Barbara Kramer) Barbara Kramer is currently in the action stage of change. As such, her goal is to continue with weight loss efforts. She has agreed to the Category 1 Plan.   Exercise goals: No exercise has been prescribed at this time.  Behavioral modification strategies: increasing lean protein intake, meal planning and cooking strategies, keeping healthy foods in the home and planning for success.  Barbara Kramer has agreed to follow-up with our clinic in 2 weeks. She was informed of the importance of frequent follow-up visits to maximize her success with intensive lifestyle modifications for her multiple health conditions.   Objective:   Blood pressure (!) 154/74, pulse 68, temperature 97.9 F (36.6 C), temperature source Oral, height 5\' 2"  (1.575 m), weight 287 lb (130.2 kg), SpO2 99 %. Body mass index is 52.49 kg/m.  General: Cooperative, alert, well developed, in no acute distress. HEENT: Conjunctivae and lids unremarkable. Cardiovascular: Regular rhythm.  Lungs: Normal work of breathing. Neurologic: No focal deficits.   Lab Results  Component Value Date   CREATININE 1.01 03/16/2020   BUN 16 03/16/2020   NA 135 03/16/2020   K 4.2 03/16/2020   CL 101 03/16/2020   CO2 27 03/16/2020   Lab Results  Component Value Date   ALT 11 03/16/2020   AST 14 03/16/2020   ALKPHOS 69 03/16/2020   BILITOT 0.4 03/16/2020   Lab Results  Component Value Date   HGBA1C 4.8 03/16/2020   HGBA1C 6.1  03/11/2019   HGBA1C 6.2 01/22/2018   HGBA1C 6.2 10/05/2016   HGBA1C 6.0 12/15/2014   Lab Results  Component Value Date   INSULIN 6.5 05/20/2020   Lab Results  Component Value Date   TSH 2.25 03/16/2020   Lab Results  Component Value Date   CHOL 163 03/16/2020   HDL 58.40 03/16/2020   LDLCALC 92 03/16/2020   TRIG 64.0 03/16/2020   CHOLHDL 3 03/16/2020   Lab Results  Component Value Date   WBC 8.7 03/16/2020   HGB 13.1  03/16/2020   HCT 40.1 03/16/2020   MCV 85.4 03/16/2020   PLT 256.0 03/16/2020   No results found for: IRON, TIBC, FERRITIN  Obesity Behavioral Intervention:   Approximately 15 minutes were spent on the discussion below.  ASK: We discussed the diagnosis of obesity with Barbara Kramer today and Barbara Kramer agreed to give [place] permission to discuss obesity behavioral modification therapy today.  ASSESS: Barbara Kramer has the diagnosis of obesity and her BMI today is 52.6. Barbara Kramer is in the action stage of change.   ADVISE: Barbara Kramer was educated on the multiple health risks of obesity as well as the benefit of weight loss to improve her health. She was advised of the need for long term treatment and the importance of lifestyle modifications to improve her current health and to decrease her risk of future health problems.  AGREE: Multiple dietary modification options and treatment options were discussed and Barbara Kramer agreed to follow the recommendations documented in the above note.  ARRANGE: Barbara Kramer was educated on the importance of frequent visits to treat obesity as outlined per CMS and USPSTF guidelines and agreed to schedule her next follow up appointment today.  Attestation Statements:   Reviewed by clinician on day of visit: allergies, medications, problem list, medical history, surgical history, family history, social history, and previous encounter notes.  Coral Ceo, am acting as transcriptionist for Coralie Common, MD.   I have reviewed the above documentation for accuracy and completeness, and I agree with the above. - Jinny Blossom, MD

## 2020-06-07 NOTE — Telephone Encounter (Signed)
Spoke with pt 06/03/20-[facility]

## 2020-06-14 DIAGNOSIS — G4733 Obstructive sleep apnea (adult) (pediatric): Secondary | ICD-10-CM | POA: Diagnosis not present

## 2020-06-22 ENCOUNTER — Encounter (INDEPENDENT_AMBULATORY_CARE_PROVIDER_SITE_OTHER): Payer: Self-pay | Admitting: Family Medicine

## 2020-06-22 ENCOUNTER — Other Ambulatory Visit: Payer: Self-pay

## 2020-06-22 ENCOUNTER — Ambulatory Visit (INDEPENDENT_AMBULATORY_CARE_PROVIDER_SITE_OTHER): Payer: Medicare HMO | Admitting: Family Medicine

## 2020-06-22 VITALS — BP 143/78 | HR 66 | Temp 98.0°F | Ht 62.0 in | Wt 284.0 lb

## 2020-06-22 DIAGNOSIS — E559 Vitamin D deficiency, unspecified: Secondary | ICD-10-CM | POA: Diagnosis not present

## 2020-06-22 DIAGNOSIS — Z6841 Body Mass Index (BMI) 40.0 and over, adult: Secondary | ICD-10-CM

## 2020-06-22 DIAGNOSIS — R030 Elevated blood-pressure reading, without diagnosis of hypertension: Secondary | ICD-10-CM

## 2020-06-27 NOTE — Progress Notes (Signed)
Chief Complaint:   OBESITY Barbara Kramer is here to discuss her progress with her obesity treatment plan along with follow-up of her obesity related diagnoses. Barbara Kramer is on the Category 1 Plan and states she is following her eating plan approximately 90% of the time. Barbara Kramer states she is not currently exercising.  Today's visit was #: 3 Starting weight: 300 lbs Starting date: 05/20/2020 Today's weight: 284 lbs Today's date: 06/22/2020 Total lbs lost to date: 16 lbs Total lbs lost since last in-office visit: 3  Interim History: Barbara Kramer is trying hard to make healthier more nutritious options in the last few weeks. She is mixing cottage cheese, jello, fruit, and nuts and lite cool whip as a snack. She likes the microwave meals. Sometimes doesn't eat everything on plan because she isn't hungry. She has eaten off plan a few times.  Subjective:   1. Vitamin D deficiency Barbara Kramer denies nausea, vomiting, and muscle weakness but notes fatigue. She has a Vit D level of 10.9. Pt is on prescription Vit D but hasn't started it due to concern for kidney function.  2. Elevated BP without diagnosis of hypertension Barbara Kramer's BP is slightly elevated.  Pt denies chest pain, chest pressure and headache.  BP Readings from Last 3 Encounters:  06/22/20 (!) 143/78  06/04/20 124/64  06/03/20 (!) 154/74    Assessment/Plan:   1. Vitamin D deficiency Low Vitamin D level contributes to fatigue and are associated with obesity, breast, and colon cancer. She agrees to continue to take prescription Vitamin D @50 ,000 IU every week and will follow-up for routine testing of Vitamin D, at least 2-3 times per year to avoid over-replacement.  2. Elevated BP without diagnosis of hypertension Barbara Kramer is working on healthy weight loss and exercise to improve blood pressure control. We will watch for signs of hypotension as she continues her lifestyle modifications. Continue monitoring BP- if still elevated at next  appointment, consider starting medication.  3. Class 3 severe obesity with serious comorbidity and body mass index (BMI) of 50.0 to 59.9 in adult, unspecified obesity type [facility]) Barbara Kramer is currently in the action stage of change. As such, her goal is to continue with weight loss efforts. She has agreed to the Category 1 Plan.   Exercise goals: No exercise has been prescribed at this time.  Behavioral modification strategies: increasing lean protein intake, decreasing eating out, no skipping meals and meal planning and cooking strategies.  Barbara Kramer has agreed to follow-up with our clinic in 2-3 weeks. She was informed of the importance of frequent follow-up visits to maximize her success with intensive lifestyle modifications for her multiple health conditions.   Objective:   Blood pressure (!) 143/78, pulse 66, temperature 98 F (36.7 C), height 5\' 2"  (1.575 m), weight 284 lb (128.8 kg), SpO2 99 %. Body mass index is 51.94 kg/m.  General: Cooperative, alert, well developed, in no acute distress. HEENT: Conjunctivae and lids unremarkable. Cardiovascular: Regular rhythm.  Lungs: Normal work of breathing. Neurologic: No focal deficits.   Lab Results  Component Value Date   CREATININE 1.01 03/16/2020   BUN 16 03/16/2020   NA 135 03/16/2020   K 4.2 03/16/2020   CL 101 03/16/2020   CO2 27 03/16/2020   Lab Results  Component Value Date   ALT 11 03/16/2020   AST 14 03/16/2020   ALKPHOS 69 03/16/2020   BILITOT 0.4 03/16/2020   Lab Results  Component Value Date   HGBA1C 4.8 03/16/2020   HGBA1C 6.1 03/11/2019  HGBA1C 6.2 01/22/2018   HGBA1C 6.2 10/05/2016   HGBA1C 6.0 12/15/2014   Lab Results  Component Value Date   INSULIN 6.5 05/20/2020   Lab Results  Component Value Date   TSH 2.25 03/16/2020   Lab Results  Component Value Date   CHOL 163 03/16/2020   HDL 58.40 03/16/2020   LDLCALC 92 03/16/2020   TRIG 64.0 03/16/2020   CHOLHDL 3 03/16/2020   Lab Results   Component Value Date   WBC 8.7 03/16/2020   HGB 13.1 03/16/2020   HCT 40.1 03/16/2020   MCV 85.4 03/16/2020   PLT 256.0 03/16/2020    Attestation Statements:   Reviewed by clinician on day of visit: allergies, medications, problem list, medical history, surgical history, family history, social history, and previous encounter notes.  Time spent on visit including pre-visit chart review and post-visit care and charting was 15 minutes.   Barbara Kramer, am acting as transcriptionist for Barbara Common, MD.   I have reviewed the above documentation for accuracy and completeness, and I agree with the above. - Barbara Blossom, MD

## 2020-06-28 ENCOUNTER — Telehealth: Payer: Self-pay

## 2020-06-28 NOTE — Telephone Encounter (Signed)
Patient called and LVM on triage line stating that she took her B/P and it was 172/80. Called and LVM for patient to return call to office.

## 2020-06-28 NOTE — Telephone Encounter (Signed)
Agree with that advisement and will see her then

## 2020-06-28 NOTE — Telephone Encounter (Signed)
Spoke with patient who stated that this morning she began to "feel funny" so she had the school nurse check her blood pressure, which was 172/80. Patient stated that she began to feel a little lightheaded and had a headache yesterday. Patient stated that she is feeling fine now. Patient denies SOB, chest pain, dizziness, weakness, or any other acute issues. Patient stated that she has been experiencing more stress at the moment. Patient has an appointment with Dr. Glori Bickers Wednesday (4/13) at 4:00. Instructed patient to check blood pressure at home if she was able, drink plenty of water, and try to avoid high sodium foods. UC and ED precautions given. Patient verbalized understanding.

## 2020-06-29 NOTE — Progress Notes (Signed)
Dear Dr. Glori Bickers,   Thank you for referring [name] to our clinic. The following note includes my evaluation and treatment recommendations.  Chief Complaint:   OBESITY EUN VERMEER (MR# [id number]) is a 70 y.o. female who presents for evaluation and treatment of obesity and related comorbidities. Current BMI is Body mass index is 54.87 kg/m. Briza has been struggling with her weight for many years and has been unsuccessful in either losing weight, maintaining weight loss, or reaching her healthy weight goal.  Rella is currently in the action stage of change and ready to dedicate time achieving and maintaining a healthier weight. Tenlee is interested in becoming our patient and working on intensive lifestyle modifications including (but not limited to) diet and exercise for weight loss.  Treasa was referred by Dr. Glori Bickers. She is skipping breakfast most days, but if she eats breakfast- English muffin with butter + grits (1 cup) + maple syrup links (3) + 2 eggs (feel full). Lunch- meat + 2 vegetables from [employer] or fried chicken salad. If eating a healthy lunch, she will often not eat the rest of the day.  Tierria's habits were reviewed today and are as follows: Her family eats meals together, she thinks her family will eat healthier with her, her desired weight loss is 179 lbs, she started gaining weight in her 28's, her heaviest weight ever was 304 pounds, she has significant food cravings issues, she snacks frequently in the evenings, she skips meals frequently, she is frequently drinking liquids with calories, she frequently makes poor food choices, she frequently eats larger portions than normal and she struggles with emotional eating.  Depression Screen Cameo's Food and Mood (modified PHQ-9) score was 6.  Depression screen PHQ 2/9 05/20/2020  Decreased Interest 1  Down, Depressed, Hopeless 0  PHQ - 2 Score 1  Altered sleeping 0  Tired, decreased energy 2   Change in appetite 0  Feeling bad or failure about yourself  0  Trouble concentrating 0  Moving slowly or fidgety/restless 3  Suicidal thoughts 0  PHQ-9 Score 6  Difficult doing work/chores Not difficult at all   Subjective:   1. Other fatigue Maycie admits to daytime somnolence and denies waking up still tired. Patent has a history of symptoms of daytime fatigue. Deisi generally gets 6 or 7 hours of sleep per night, and states that she has generally restful sleep. Snoring is present. Apneic episodes are present (not with CPAP). Epworth Sleepiness Score is 16. EKG normal sinus rhythm at 86 bpm.  2. SOB (shortness of breath) on exertion Loralei notes increasing shortness of breath with exercising and seems to be worsening over time with weight gain. She notes getting out of breath sooner with activity than she used to. This has gotten worse recently. Maat denies shortness of breath at rest or orthopnea. EKG normal sinus rhythm at 86 bpm.  3. OSA (obstructive sleep apnea) Lexxi wears a CPAP almost 100% compliance. She does mention she occasionally falls asleep when watching tv without CPAP on.  4. Hyperglycemia Meleah's recent A1c 4.8, but previously >6.0. She is not on medication.  5. Vitamin D deficiency Augustine denies nausea, vomiting, and muscle weakness but notes fatigue. Pt is not on Vit D supplementation.  6. Elevated BP without diagnosis of hypertension Kassady's BP is slightly uncontrolled.  Pt denies chest pain, chest pressure and headache. She is not on meds.  Assessment/Plan:   1. Other fatigue Armetta does feel that her weight is  causing her energy to be lower than it should be. Fatigue may be related to obesity, depression or many other causes. Labs will be ordered, and in the meanwhile, Luma will focus on self care including making healthy food choices, increasing physical activity and focusing on stress reduction. Check labs today.  - T3 -  T4  2. SOB (shortness of breath) on exertion Jizelle does feel that she gets out of breath more easily that she used to when she exercises. Nikea's shortness of breath appears to be obesity related and exercise induced. She has agreed to work on weight loss and gradually increase exercise to treat her exercise induced shortness of breath. Will continue to monitor closely.  3. OSA (obstructive sleep apnea) Intensive lifestyle modifications are the first line treatment for this issue. We discussed several lifestyle modifications today and she will continue to work on diet, exercise and weight loss efforts. We will continue to monitor. Orders and follow up as documented in patient record. Follow up with sleep doctor at next scheduled appointment.  4. Hyperglycemia Fasting labs will be obtained and results with be discussed with Mardene Celeste in 2 weeks at her follow up visit. In the meanwhile Orlinda was started on a lower simple carbohydrate diet and will work on weight loss efforts. Check labs today.  - Insulin, random  5. Vitamin D deficiency Low Vitamin D level contributes to fatigue and are associated with obesity, breast, and colon cancer. She agrees to follow-up for routine testing of Vitamin D, at least 2-3 times per year to avoid over-replacement. Check labs today.  - VITAMIN D 25 Hydroxy (Vit-D Deficiency, Fractures)  6. Elevated BP without diagnosis of hypertension Jaquelyne is working on healthy weight loss and exercise to improve blood pressure control. We will watch for signs of hypotension as she continues her lifestyle modifications. Follow up BP at next appointment.  7. Depression screening Nyjai had a positive depression screening. Depression is commonly associated with obesity and often results in emotional eating behaviors. We will monitor this closely and work on CBT to help improve the non-hunger eating patterns. Referral to Psychology may be required if no improvement is  seen as she continues in our clinic.  8. Class 3 severe obesity with serious comorbidity and body mass index (BMI) of 50.0 to 59.9 in adult, unspecified obesity type [facility]) Tywanna is currently in the action stage of change and her goal is to continue with weight loss efforts. I recommend Evaleen begin the structured treatment plan as follows:  She has agreed to the Category 1 Plan + 100 calories.  Exercise goals: No exercise has been prescribed at this time.   Behavioral modification strategies: increasing lean protein intake, meal planning and cooking strategies, keeping healthy foods in the home and planning for success.  She was informed of the importance of frequent follow-up visits to maximize her success with intensive lifestyle modifications for her multiple health conditions. She was informed we would discuss her lab results at her next visit unless there is a critical issue that needs to be addressed sooner. Jovanna agreed to keep her next visit at the agreed upon time to discuss these results.  Objective:   Blood pressure (!) 156/77, pulse 68, temperature (!) 97.4 F (36.3 C), temperature source Oral, height 5\' 2"  (1.575 m), weight 300 lb (136.1 kg), SpO2 100 %. Body mass index is 54.87 kg/m.  EKG: Normal sinus rhythm, rate 86 (done on 05/17/2020 at [facility]).  Indirect Calorimeter completed today shows a  VO2 of 187 and a REE of 1298.  Her calculated basal metabolic rate is 0160 thus her basal metabolic rate is worse than expected.  General: Cooperative, alert, well developed, in no acute distress. HEENT: Conjunctivae and lids unremarkable. Cardiovascular: Regular rhythm.  Lungs: Normal work of breathing. Neurologic: No focal deficits.   Lab Results  Component Value Date   CREATININE 1.01 03/16/2020   BUN 16 03/16/2020   NA 135 03/16/2020   K 4.2 03/16/2020   CL 101 03/16/2020   CO2 27 03/16/2020   Lab Results  Component Value Date   ALT 11 03/16/2020   AST 14  03/16/2020   ALKPHOS 69 03/16/2020   BILITOT 0.4 03/16/2020   Lab Results  Component Value Date   HGBA1C 4.8 03/16/2020   HGBA1C 6.1 03/11/2019   HGBA1C 6.2 01/22/2018   HGBA1C 6.2 10/05/2016   HGBA1C 6.0 12/15/2014   Lab Results  Component Value Date   INSULIN 6.5 05/20/2020   Lab Results  Component Value Date   TSH 2.25 03/16/2020   Lab Results  Component Value Date   CHOL 163 03/16/2020   HDL 58.40 03/16/2020   LDLCALC 92 03/16/2020   TRIG 64.0 03/16/2020   CHOLHDL 3 03/16/2020   Lab Results  Component Value Date   WBC 8.7 03/16/2020   HGB 13.1 03/16/2020   HCT 40.1 03/16/2020   MCV 85.4 03/16/2020   PLT 256.0 03/16/2020   No results found for: IRON, TIBC, FERRITIN Obesity Behavioral Intervention:   Approximately 15 minutes were spent on the discussion below.  ASK: We discussed the diagnosis of obesity with Mardene Celeste today and Earleen agreed to give [place] permission to discuss obesity behavioral modification therapy today.  ASSESS: Millenia has the diagnosis of obesity and her BMI today is 54.9. Bernadean is in the action stage of change.   ADVISE: Betsabe was educated on the multiple health risks of obesity as well as the benefit of weight loss to improve her health. She was advised of the need for long term treatment and the importance of lifestyle modifications to improve her current health and to decrease her risk of future health problems.  AGREE: Multiple dietary modification options and treatment options were discussed and Jasalyn agreed to follow the recommendations documented in the above note.  ARRANGE: Irmalee was educated on the importance of frequent visits to treat obesity as outlined per CMS and USPSTF guidelines and agreed to schedule her next follow up appointment today.  Attestation Statements:   Reviewed by clinician on day of visit: allergies, medications, problem list, medical history, surgical history, family history, social history,  and previous encounter notes.  Coral Ceo, am acting as transcriptionist for Coralie Common, MD.   This is the patient's first visit at [facility]. The patient's NEW PATIENT PACKET was reviewed at length. Included in the packet: current and past health history, medications, allergies, ROS, gynecologic history (women only), surgical history, family history, social history, weight history, weight loss surgery history (for those that have had weight loss surgery), nutritional evaluation, mood and food questionnaire, PHQ9, Epworth questionnaire, sleep habits questionnaire, patient life and health improvement goals questionnaire. These will all be scanned into the patient's chart under media.   During the visit, I independently reviewed the patient's EKG, bioimpedance scale results, and indirect calorimeter results. I used this information to tailor a meal plan for the patient that will help her to lose weight and will improve her obesity-related conditions going forward. I performed  a medically necessary appropriate examination and/or evaluation. I discussed the assessment and treatment plan with the patient. The patient was provided an opportunity to ask questions and all were answered. The patient agreed with the plan and demonstrated an understanding of the instructions. Labs were ordered at this visit and will be reviewed at the next visit unless more critical results need to be addressed immediately. Clinical information was updated and documented in the EMR.   Time spent on visit including pre-visit chart review and post-visit care was 45 minutes.   A separate 15 minutes was spent on risk counseling (see above).  I have reviewed the above documentation for accuracy and completeness, and I agree with the above. - Jinny Blossom, MD

## 2020-06-30 ENCOUNTER — Encounter: Payer: Self-pay | Admitting: Family Medicine

## 2020-06-30 ENCOUNTER — Ambulatory Visit (INDEPENDENT_AMBULATORY_CARE_PROVIDER_SITE_OTHER): Payer: Medicare HMO | Admitting: Family Medicine

## 2020-06-30 ENCOUNTER — Other Ambulatory Visit: Payer: Self-pay

## 2020-06-30 VITALS — BP 142/80 | HR 63 | Temp 96.9°F | Ht 62.0 in | Wt 288.2 lb

## 2020-06-30 DIAGNOSIS — Z6841 Body Mass Index (BMI) 40.0 and over, adult: Secondary | ICD-10-CM | POA: Diagnosis not present

## 2020-06-30 DIAGNOSIS — I10 Essential (primary) hypertension: Secondary | ICD-10-CM

## 2020-06-30 MED ORDER — CHLORTHALIDONE 25 MG PO TABS: 12.5000 mg | ORAL_TABLET | Freq: Every day | ORAL | 3 refills | Status: DC

## 2020-06-30 NOTE — Assessment & Plan Note (Signed)
Mild HTN with family hx Some headache/dizziness prior  Reassuring exam  Disc lifestyle change and handouts given  Px chlorthalidone 12.5 mg daily and disc poss side eff Enc continued wt loss  F/u in approx 2 wk for visit and labs

## 2020-06-30 NOTE — Patient Instructions (Addendum)
Drink lots of water  64 oz per day   Avoid processed food as much as you can  Salty food/sodium - causes increased blood pressure   Exercise helps -daily, work up to 30 or more minutes   Follow up in about 2 weeks for visit and labs   Start chlorthalidone 1/2 pill daily in the am  If any intolerable side effects let me know

## 2020-06-30 NOTE — Progress Notes (Signed)
Subjective:    Patient ID: Barbara Kramer, female    DOB: [birth year], 70 y.o.   MRN: [id number]  This visit occurred during the SARS-CoV-2 public health emergency.  Safety protocols were in place, including screening questions prior to the visit, additional usage of staff PPE, and extensive cleaning of exam room while observing appropriate contact time as indicated for disinfecting solutions.    HPI Pt presents for BP concern   Also ? Knot on the back of her head /was sore (not now)  Just noticed it   Wt Readings from Last 3 Encounters:  06/30/20 288 lb 3 oz (130.7 kg)  06/22/20 284 lb (128.8 kg)  06/04/20 291 lb (132 kg)   52.71 kg/m  Pt called noting that on the am of 4/11 she felt funny bp per school nurse was 172/80 Light headed and then got better   She had a headache last night   Exercise- on and off  (has used a pedal machine) -excited to use it more   She is motivated to change  Has worked on weight loss  [facility] - lost 16 lb     BP Readings from Last 3 Encounters:  06/30/20 (!) 142/80  06/22/20 (!) 143/78  06/04/20 124/64   Pulse Readings from Last 3 Encounters:  06/30/20 63  06/22/20 66  06/04/20 60   2 sisters have HTN   A lot of stress  Works full time - considering retirement   More pre made frozen meals for lunch  Patient Active Problem List   Diagnosis Date Noted  . Essential hypertension 06/30/2020  . Medicare annual wellness visit, subsequent 05/14/2020  . SOB (shortness of breath) on exertion 05/14/2020  . Unilateral primary osteoarthritis, left knee 05/10/2020  . Morbid obesity with body mass index (BMI) of 50.0 to 59.9 in adult [facility]) 05/10/2020  . Hand paresthesia 03/23/2020  . Family history of colon cancer in mother   . Benign neoplasm of colon   . Welcome to Medicare preventive visit 01/29/2018  . OSA (obstructive sleep apnea) 01/29/2018  . Estrogen deficiency 01/29/2018  .  Allergic rhinitis 05/15/2017  . Screening mammogram, encounter for 10/11/2016  . Snoring 10/11/2016  . Prediabetes 12/15/2014  . Osteoarthritis of left knee 10/16/2013  . Left knee pain 10/15/2013  . Colon cancer screening 11/20/2012  . Routine gynecological examination 11/17/2011  . Other screening mammogram 11/17/2011  . Low back pain 10/18/2010  . Routine general medical examination at a health care facility 10/06/2010  . 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 Eczema 08/02/2006   Past Medical History:  Diagnosis Date  . Anemia   . Arthritis   . Breast lump    left  . Eczema   . Family hx of colon cancer   . Gallbladder problem   . Kidney cysts    small, right 10/2002  . Normal cardiac stress test 9/10   exercise stress test normal but limited by exercise intolerance  . Obesity   . Shortness of breath   . Sleep apnea    Past Surgical History:  Procedure Laterality Date  . CHOLECYSTECTOMY  05/2002   [place]- gallstones, small renal cyst 05/2002  . COLONOSCOPY WITH PROPOFOL N/A 07/15/2019   Procedure: COLONOSCOPY WITH PROPOFOL;  Surgeon: Yetta Flock, MD;  Location: [facility];  Service: Gastroenterology;  Laterality: N/A;  . FOOT SURGERY  11/1998   bunion  . PARTIAL HYSTERECTOMY  1998  fibroids, ovaries remain  . POLYPECTOMY  07/15/2019   Procedure: POLYPECTOMY;  Surgeon: Yetta Flock, MD;  Location: [facility];  Service: Gastroenterology;;   Social History   Tobacco Use  . Smoking status: Former Smoker    Packs/day: 0.25    Years: 5.00    Pack years: 1.25    Types: Cigarettes    Quit date: 03/20/1976    Years since quitting: 44.3  . Smokeless tobacco: Never Used  Substance Use Topics  . Alcohol use: No    Alcohol/week: 0.0 standard drinks  . Drug use: No   Family History  Problem Relation Age of Onset  . Colon cancer Mother   . Stomach cancer Mother   . Cancer Mother   . Lung cancer Father        smoker  . Cancer Father   . Hypertension Sister    . Dementia Sister   . Stroke Maternal Grandmother   . Stroke Paternal Grandmother   . Hypertension Sister   . Colon polyps Daughter   . Heart attack Maternal Grandfather    Allergies  Allergen Reactions  . Calcium Carbonate     REACTION: constipation  . Penicillins Rash    Has patient had a PCN reaction causing immediate rash, facial/tongue/throat swelling, SOB or lightheadedness with hypotension: no Has patient had a PCN reaction causing severe rash involving mucus membranes or skin necrosis: no Has patient had a PCN reaction that required hospitalization: no Has patient had a PCN reaction occurring within the last 10 years: no If all of the above answers are "NO", then may proceed with Cephalosporin use.    No current outpatient medications on file prior to visit.   No current facility-administered medications on file prior to visit.    Review of Systems  Constitutional: Negative for activity change, appetite change, fatigue, fever and unexpected weight change.  HENT: Negative for congestion, ear pain, rhinorrhea, sinus pressure and sore throat.   Eyes: Negative for pain, redness and visual disturbance.  Respiratory: Negative for cough, shortness of breath and wheezing.   Cardiovascular: Negative for chest pain and palpitations.       Occ ankle swelling   Gastrointestinal: Negative for abdominal pain, blood in stool, constipation and diarrhea.  Endocrine: Negative for polydipsia and polyuria.  Genitourinary: Negative for dysuria, frequency and urgency.  Musculoskeletal: Negative for arthralgias, back pain and myalgias.  Skin: Negative for pallor and rash.  Allergic/Immunologic: Negative for environmental allergies.  Neurological: Positive for headaches. Negative for dizziness and syncope.  Hematological: Negative for adenopathy. Does not bruise/bleed easily.  Psychiatric/Behavioral: Negative for decreased concentration and dysphoric mood. The patient is not nervous/anxious.         Objective:   Physical Exam Constitutional:      General: She is not in acute distress.    Appearance: Normal appearance. She is well-developed. She is obese. She is not ill-appearing.  HENT:     Head: Normocephalic and atraumatic.  Eyes:     Conjunctiva/sclera: Conjunctivae normal.     Pupils: Pupils are equal, round, and reactive to light.  Neck:     Thyroid: No thyromegaly.     Vascular: No carotid bruit or JVD.  Cardiovascular:     Rate and Rhythm: Normal rate and regular rhythm.     Heart sounds: Normal heart sounds. No gallop.   Pulmonary:     Effort: Pulmonary effort is normal. No respiratory distress.     Breath sounds: Normal breath sounds. No wheezing  or rales.  Abdominal:     General: Bowel sounds are normal. There is no distension or abdominal bruit.     Palpations: Abdomen is soft. There is no mass.     Tenderness: There is no abdominal tenderness.  Musculoskeletal:     Cervical back: Normal range of motion and neck supple.     Right lower leg: Edema present.     Left lower leg: Edema present.     Comments: Trace ankle edema   Lymphadenopathy:     Cervical: No cervical adenopathy.  Skin:    General: Skin is warm and dry.     Findings: No rash.  Neurological:     Mental Status: She is alert.     Cranial Nerves: No cranial nerve deficit.     Coordination: Coordination normal.     Deep Tendon Reflexes: Reflexes are normal and symmetric. Reflexes normal.  Psychiatric:        Mood and Affect: Mood normal.           Assessment & Plan:   Problem List Items Addressed This Visit      Cardiovascular and Mediastinum   Essential hypertension - Primary    Mild HTN with family hx Some headache/dizziness prior  Reassuring exam  Disc lifestyle change and handouts given  Px chlorthalidone 12.5 mg daily and disc poss side eff Enc continued wt loss  F/u in approx 2 wk for visit and labs      Relevant Medications   chlorthalidone (HYGROTON) 25 MG  tablet     Other   Morbid obesity with body mass index (BMI) of 50.0 to 59.9 in adult [facility])    [facility]  Commended effort and progress so far

## 2020-06-30 NOTE — Assessment & Plan Note (Signed)
[facility]  Commended effort and progress so far

## 2020-07-14 ENCOUNTER — Ambulatory Visit (INDEPENDENT_AMBULATORY_CARE_PROVIDER_SITE_OTHER): Payer: Medicare HMO | Admitting: Family Medicine

## 2020-07-15 DIAGNOSIS — G4733 Obstructive sleep apnea (adult) (pediatric): Secondary | ICD-10-CM | POA: Diagnosis not present

## 2020-08-02 ENCOUNTER — Ambulatory Visit (INDEPENDENT_AMBULATORY_CARE_PROVIDER_SITE_OTHER): Payer: Medicare HMO | Admitting: Family Medicine

## 2020-08-02 ENCOUNTER — Other Ambulatory Visit: Payer: Self-pay

## 2020-08-02 ENCOUNTER — Encounter: Payer: Self-pay | Admitting: Family Medicine

## 2020-08-02 DIAGNOSIS — R21 Rash and other nonspecific skin eruption: Secondary | ICD-10-CM | POA: Diagnosis not present

## 2020-08-02 MED ORDER — TRIAMCINOLONE ACETONIDE 0.1 % EX CREA: 1.0000 "application " | TOPICAL_CREAM | Freq: Two times a day (BID) | CUTANEOUS | 0 refills | Status: DC

## 2020-08-02 NOTE — Assessment & Plan Note (Signed)
Differential includes dermatitis (h/o eczema), acanthosis nigrans, less likely necrobiosis  Given itch will tx carefully with topical steroid  Try not to scratch Keep cool Watch for hair loss Update if not starting to improve in a week or if worsening

## 2020-08-02 NOTE — Patient Instructions (Signed)
I think the rash may be dermatitis (eczema) or acanthosis   Keep working on healthy diet and exercise and weight loss Good job!  Try the triamcinolone for itch, very small amount up to twice daily  Do not use it over 14 days at a time   Use non scented products and cleansers when able

## 2020-08-02 NOTE — Progress Notes (Signed)
Subjective:    Patient ID: Barbara Kramer, female    DOB: 24-Jul-1950, 70 y.o.   MRN: [id number]  This visit occurred during the SARS-CoV-2 public health emergency.  Safety protocols were in place, including screening questions prior to the visit, additional usage of staff PPE, and extensive cleaning of exam room while observing appropriate contact time as indicated for disinfecting solutions.    HPI  Pt presents with rash on her neck   Wt Readings from Last 3 Encounters:  08/02/20 281 lb 4.8 oz (127.6 kg)  06/30/20 288 lb 3 oz (130.7 kg)  06/22/20 284 lb (128.8 kg)   51.45 kg/m   Has lost significant weight  She follows a specific diet - 1000 calorie daily  Less processed food  .less fat  Using an exercise machine -does not get too sweaty  No tick bites   No other rash besides neck   No exposures  More strawberries with new diet  No new scents Uses castor oil on hair (years) Wears her hair up and leaves down at night (does not pull tightly at all) Going grey     Rash on the back of her neck  Says she has not had before?  Itching for a few months (that started before the rash)  Then she scratched   Used a moisturizer   Had eczema as a child   Thinks her hair looks a little thinner at hair line    Prediabetes Lab Results  Component Value Date   HGBA1C 4.8 03/16/2020    Patient Active Problem List   Diagnosis Date Noted  . Rash of neck 08/02/2020  . Essential hypertension 06/30/2020  . Medicare annual wellness visit, subsequent 05/14/2020  . SOB (shortness of breath) on exertion 05/14/2020  . Unilateral primary osteoarthritis, left knee 05/10/2020  . Morbid obesity with body mass index (BMI) of 50.0 to 59.9 in adult [facility]) 05/10/2020  . Hand paresthesia 03/23/2020  . Family history of colon cancer in mother   . Benign neoplasm of colon   . Welcome to Medicare preventive visit 01/29/2018  . OSA (obstructive sleep apnea) 01/29/2018  . Estrogen  deficiency 01/29/2018  . Allergic rhinitis 05/15/2017  . Screening mammogram, encounter for 10/11/2016  . Snoring 10/11/2016  . Prediabetes 12/15/2014  . Osteoarthritis of left knee 10/16/2013  . Left knee pain 10/15/2013  . Colon cancer screening 11/20/2012  . Routine gynecological examination 11/17/2011  . Other screening mammogram 11/17/2011  . Low back pain 10/18/2010  . Routine general medical examination at a health care facility 10/06/2010  . 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 Eczema 08/02/2006   Past Medical History:  Diagnosis Date  . Anemia   . Arthritis   . Breast lump    left  . Eczema   . Family hx of colon cancer   . Gallbladder problem   . Kidney cysts    small, right 10/2002  . Normal cardiac stress test 9/10   exercise stress test normal but limited by exercise intolerance  . Obesity   . Shortness of breath   . Sleep apnea    Past Surgical History:  Procedure Laterality Date  . CHOLECYSTECTOMY  05/2002   [place]- gallstones, small renal cyst 05/2002  . COLONOSCOPY WITH PROPOFOL N/A 07/15/2019   Procedure: COLONOSCOPY WITH PROPOFOL;  Surgeon: Yetta Flock, MD;  Location: [facility];  Service: Gastroenterology;  Laterality: N/A;  . FOOT SURGERY  11/1998   bunion  . PARTIAL HYSTERECTOMY  1998   fibroids, ovaries remain  . POLYPECTOMY  07/15/2019   Procedure: POLYPECTOMY;  Surgeon: Yetta Flock, MD;  Location: [facility];  Service: Gastroenterology;;   Social History   Tobacco Use  . Smoking status: Former Smoker    Packs/day: 0.25    Years: 5.00    Pack years: 1.25    Types: Cigarettes    Quit date: 03/20/1976    Years since quitting: 44.4  . Smokeless tobacco: Never Used  Substance Use Topics  . Alcohol use: No    Alcohol/week: 0.0 standard drinks  . Drug use: No   Family History  Problem Relation Age of Onset  . Colon cancer Mother   . Stomach cancer Mother   . Cancer Mother   . Lung cancer Father        smoker  . Cancer  Father   . Hypertension Sister   . Dementia Sister   . Stroke Maternal Grandmother   . Stroke Paternal Grandmother   . Hypertension Sister   . Colon polyps Daughter   . Heart attack Maternal Grandfather    Allergies  Allergen Reactions  . Calcium Carbonate     REACTION: constipation  . Penicillins Rash    Has patient had a PCN reaction causing immediate rash, facial/tongue/throat swelling, SOB or lightheadedness with hypotension: no Has patient had a PCN reaction causing severe rash involving mucus membranes or skin necrosis: no Has patient had a PCN reaction that required hospitalization: no Has patient had a PCN reaction occurring within the last 10 years: no If all of the above answers are "NO", then may proceed with Cephalosporin use.    Current Outpatient Medications on File Prior to Visit  Medication Sig Dispense Refill  . chlorthalidone (HYGROTON) 25 MG tablet Take 0.5 tablets (12.5 mg total) by mouth daily. 45 tablet 3   No current facility-administered medications on file prior to visit.    Review of Systems  Constitutional: Negative for activity change, appetite change, fatigue, fever and unexpected weight change.  HENT: Negative for congestion, ear pain, rhinorrhea, sinus pressure and sore throat.   Eyes: Negative for pain, redness and visual disturbance.  Respiratory: Negative for cough, shortness of breath and wheezing.   Cardiovascular: Negative for chest pain and palpitations.  Gastrointestinal: Negative for abdominal pain, blood in stool, constipation and diarrhea.  Endocrine: Negative for polydipsia and polyuria.  Genitourinary: Negative for dysuria, frequency and urgency.  Musculoskeletal: Negative for arthralgias, back pain and myalgias.  Skin: Positive for color change and rash. Negative for pallor.  Allergic/Immunologic: Negative for environmental allergies.  Neurological: Negative for dizziness, syncope and headaches.  Hematological: Negative for  adenopathy. Does not bruise/bleed easily.  Psychiatric/Behavioral: Negative for decreased concentration and dysphoric mood. The patient is not nervous/anxious.        Objective:   Physical Exam Constitutional:      General: She is not in acute distress.    Appearance: Normal appearance. She is obese. She is not ill-appearing.  Eyes:     General:        Right eye: No discharge.        Left eye: No discharge.     Conjunctiva/sclera: Conjunctivae normal.     Pupils: Pupils are equal, round, and reactive to light.  Cardiovascular:     Rate and Rhythm: Normal rate and regular rhythm.  Musculoskeletal:     Cervical back: Normal range of motion and neck supple. No tenderness.     Right lower leg:  No edema.     Left lower leg: No edema.  Lymphadenopathy:     Cervical: No cervical adenopathy.  Skin:    General: Skin is warm and dry.     Coloration: Skin is not jaundiced.     Findings: Rash present. No bruising.     Comments: 1 by 2 cm patch of slightly hyperpigmented skin with scant scale/silver appearance (not very raised) No excoriations  No other rash areas  Small area on R cheek is hyperpigmented (per pt baseline)  Neurological:     Mental Status: She is alert.     Cranial Nerves: No cranial nerve deficit.  Psychiatric:        Mood and Affect: Mood normal.           Assessment & Plan:   Problem List Items Addressed This Visit      Musculoskeletal and Integument   Rash of neck    Differential includes dermatitis (h/o eczema), acanthosis nigrans, less likely necrobiosis  Given itch will tx carefully with topical steroid  Try not to scratch Keep cool Watch for hair loss Update if not starting to improve in a week or if worsening

## 2020-08-14 DIAGNOSIS — G4733 Obstructive sleep apnea (adult) (pediatric): Secondary | ICD-10-CM | POA: Diagnosis not present

## 2020-09-09 ENCOUNTER — Other Ambulatory Visit: Payer: Self-pay | Admitting: *Deleted

## 2020-09-09 MED ORDER — CHLORTHALIDONE 25 MG PO TABS: 12.5000 mg | ORAL_TABLET | Freq: Every day | ORAL | 2 refills | Status: DC

## 2020-09-14 DIAGNOSIS — G4733 Obstructive sleep apnea (adult) (pediatric): Secondary | ICD-10-CM | POA: Diagnosis not present

## 2020-10-05 ENCOUNTER — Telehealth: Payer: Self-pay | Admitting: Adult Health

## 2020-10-05 DIAGNOSIS — G4733 Obstructive sleep apnea (adult) (pediatric): Secondary | ICD-10-CM

## 2020-10-05 NOTE — Telephone Encounter (Signed)
Called and spoke with patient who states that Patient no longer uses CPAP machine. States [facility] needs order to pick up CPAP machine.   Tammy please advise if you are ok with [place] sending in order

## 2020-10-07 NOTE — Telephone Encounter (Signed)
Tried calling pt and there was no answer- LMTCB.  

## 2020-10-07 NOTE — Telephone Encounter (Signed)
Spoke with the pt  She does remember discussing dangers of untreated OSA  She states that she still wants to d/c  She has not been using b/c she said it's too uncomfortable, and caused her to have HA and tooth discomfort  Order sent to d/c CPAP  Forwarding to MR as [place]

## 2020-10-07 NOTE — Telephone Encounter (Signed)
Pt returning a phone call. Pt can be reached at [phone]

## 2020-10-07 NOTE — Telephone Encounter (Signed)
Do we know why Barbara Kramer is not using CPAP  Went over in office visit the dangers of untreated Sleep apnea. Please make sure Barbara Kramer is aware .   Will forward to Dr. Chase Caller for [facility]  If refuses CPAP can send order to d/c with DME

## 2020-10-14 NOTE — Telephone Encounter (Signed)
Ok is her choice. Can dc cPAP

## 2020-10-24 ENCOUNTER — Emergency Department (HOSPITAL_COMMUNITY)
Admission: EM | Admit: 2020-10-24 | Discharge: 2020-10-25 | Disposition: A | Discharge status: Home / Self Care | Payer: Medicare HMO | Attending: Emergency Medicine | Admitting: Emergency Medicine

## 2020-10-24 ENCOUNTER — Encounter (HOSPITAL_COMMUNITY): Payer: Self-pay | Admitting: Emergency Medicine

## 2020-10-24 ENCOUNTER — Other Ambulatory Visit: Payer: Self-pay

## 2020-10-24 DIAGNOSIS — Z87891 Personal history of nicotine dependence: Secondary | ICD-10-CM | POA: Diagnosis not present

## 2020-10-24 DIAGNOSIS — I10 Essential (primary) hypertension: Secondary | ICD-10-CM | POA: Insufficient documentation

## 2020-10-24 DIAGNOSIS — R109 Unspecified abdominal pain: Secondary | ICD-10-CM | POA: Diagnosis not present

## 2020-10-24 HISTORY — DX: Essential (primary) hypertension: I10

## 2020-10-24 LAB — CBC WITH DIFFERENTIAL/PLATELET
Band Neutrophils: 0 %
Basophils Relative: 0 %
Blasts: NONE SEEN %
Eosinophils Absolute: NONE SEEN 10*3/uL (ref 0.0–0.5)
Eosinophils Relative: 0 %
HCT: 43 % (ref 36.0–46.0)
Hemoglobin: 14 g/dL (ref 12.0–15.0)
Lymphocytes Relative: 30 %
MCH: 28.3 pg (ref 26.0–34.0)
MCHC: 32.6 g/dL (ref 30.0–36.0)
MCV: 86.9 fL (ref 80.0–100.0)
Metamyelocytes Relative: NONE SEEN %
Monocytes Relative: 2 %
Myelocytes: NONE SEEN %
Neutrophils Relative %: 68 %
Platelets: 269 10*3/uL (ref 150–400)
Promyelocytes Relative: NONE SEEN %
RBC Morphology: NORMAL
RBC: 4.95 MIL/uL (ref 3.87–5.11)
RDW: 14.7 % (ref 11.5–15.5)
WBC Morphology: NORMAL
WBC: 10.4 10*3/uL (ref 4.0–10.5)
nRBC: 0 % (ref 0.0–0.2)
nRBC: NONE SEEN /100 WBC

## 2020-10-24 LAB — URINALYSIS, ROUTINE W REFLEX MICROSCOPIC
Bilirubin Urine: NEGATIVE
Glucose, UA: NEGATIVE mg/dL
Hgb urine dipstick: NEGATIVE
Ketones, ur: NEGATIVE mg/dL
Leukocytes,Ua: NEGATIVE
Nitrite: NEGATIVE
Protein, ur: NEGATIVE mg/dL
Specific Gravity, Urine: 1.018 (ref 1.005–1.030)
pH: 5 (ref 5.0–8.0)

## 2020-10-24 LAB — COMPREHENSIVE METABOLIC PANEL
ALT: 13 U/L (ref 0–44)
AST: 18 U/L (ref 15–41)
Albumin: 4.3 g/dL (ref 3.5–5.0)
Alkaline Phosphatase: 59 U/L (ref 38–126)
Anion gap: 9 (ref 5–15)
BUN: 15 mg/dL (ref 8–23)
CO2: 28 mmol/L (ref 22–32)
Calcium: 9.8 mg/dL (ref 8.9–10.3)
Chloride: 99 mmol/L (ref 98–111)
Creatinine, Ser: 1.09 mg/dL — ABNORMAL HIGH (ref 0.44–1.00)
GFR, Estimated: 56 mL/min — ABNORMAL LOW (ref >60)
Glucose, Bld: 94 mg/dL (ref 70–99)
Potassium: 3.8 mmol/L (ref 3.5–5.1)
Sodium: 136 mmol/L (ref 135–145)
Total Bilirubin: 0.4 mg/dL (ref 0.3–1.2)
Total Protein: 8 g/dL (ref 6.5–8.1)

## 2020-10-24 LAB — LIPASE, BLOOD: Lipase: 34 U/L (ref 11–51)

## 2020-10-24 NOTE — ED Triage Notes (Signed)
Pt c/o mid abdominal pain that progressed to right side/flank pain that began earlier this afternoon. Pain is constant. Denies N/V/D, fevers, changes in urinary/bowel.

## 2020-10-24 NOTE — ED Notes (Signed)
Pt provided warm blanket.

## 2020-10-24 NOTE — ED Notes (Signed)
Pt ambulatory in ED lobby. 

## 2020-10-25 NOTE — Discharge Instructions (Addendum)
Your work-up in the emergency department today has been reassuring.  We advise close follow-up with your primary care doctor.  Avoid strenuous activity and heavy lifting.  You may use Tylenol or ibuprofen for management of any residual discomfort.  Return for new or concerning symptoms.

## 2020-10-25 NOTE — ED Provider Notes (Signed)
[facility] DEPT Provider Note   CSN: [id number] Arrival date & time: 10/24/20  2014     History Chief Complaint  Patient presents with   Abdominal Pain   Flank Pain    Barbara Kramer is a 70 y.o. female.  70 year old female with a history of hypertension, obesity presents to the emergency department for evaluation of abdominal pain and flank pain.  States that her symptoms began in the afternoon and her mid abdomen.  Reports constant pain that has migrated around to her right side and right flank.  It is spontaneously improving and presently mild.  She has not taken any medications for her symptoms.  Had a normal bowel movement earlier today.  No associated fevers, nausea, vomiting, dysuria, hematuria, melena or hematochezia.  No recent strenuous activity or heavy lifting.  Abdominal surgical history significant for cholecystectomy as well as partial hysterectomy.  The history is provided by the patient. No language interpreter was used.  Abdominal Pain Flank Pain Associated symptoms include abdominal pain.      Past Medical History:  Diagnosis Date   Anemia    Arthritis    Breast lump    left   Eczema    Family hx of colon cancer    Gallbladder problem    Hypertension    Kidney cysts    small, right 10/2002   Normal cardiac stress test 11/2008   exercise stress test normal but limited by exercise intolerance   Obesity    Shortness of breath    Sleep apnea     Patient Active Problem List   Diagnosis Date Noted   Rash of neck 08/02/2020   Essential hypertension 06/30/2020   Medicare annual wellness visit, subsequent 05/14/2020   SOB (shortness of breath) on exertion 05/14/2020   Unilateral primary osteoarthritis, left knee 05/10/2020   Morbid obesity with body mass index (BMI) of 50.0 to 59.9 in adult [facility]) 05/10/2020   Hand paresthesia 03/23/2020   Family history of colon cancer in mother    Benign neoplasm of colon    Welcome  to Medicare preventive visit 01/29/2018   OSA (obstructive sleep apnea) 01/29/2018   Estrogen deficiency 01/29/2018   Allergic rhinitis 05/15/2017   Screening mammogram, encounter for 10/11/2016   Snoring 10/11/2016   Prediabetes 12/15/2014   Osteoarthritis of left knee 10/16/2013   Left knee pain 10/15/2013   Colon cancer screening 11/20/2012   Routine gynecological examination 11/17/2011   Other screening mammogram 11/17/2011   Low back pain 10/18/2010   Routine general medical examination at a health care facility 10/06/2010   ACANTHOSIS NIGRICANS 01/27/2008   Eczema 08/02/2006    Past Surgical History:  Procedure Laterality Date   CHOLECYSTECTOMY  05/2002   [place]- gallstones, small renal cyst 05/2002   COLONOSCOPY WITH PROPOFOL N/A 07/15/2019   Procedure: COLONOSCOPY WITH PROPOFOL;  Surgeon: Yetta Flock, MD;  Location: [facility];  Service: Gastroenterology;  Laterality: N/A;   FOOT SURGERY  11/1998   bunion   PARTIAL HYSTERECTOMY  1998   fibroids, ovaries remain   POLYPECTOMY  07/15/2019   Procedure: POLYPECTOMY;  Surgeon: Yetta Flock, MD;  Location: [facility];  Service: Gastroenterology;;     OB History     Gravida  3   Para      Term      Preterm      AB      Living  2      SAB  IAB      Ectopic      Multiple      Live Births              Family History  Problem Relation Age of Onset   Colon cancer Mother    Stomach cancer Mother    Cancer Mother    Lung cancer Father        smoker   Cancer Father    Hypertension Sister    Dementia Sister    Stroke Maternal Grandmother    Stroke Paternal Grandmother    Hypertension Sister    Colon polyps Daughter    Heart attack Maternal Grandfather     Social History   Tobacco Use   Smoking status: Former    Packs/day: 0.25    Years: 5.00    Pack years: 1.25    Types: Cigarettes    Quit date: 03/20/1976    Years since quitting: 44.6   Smokeless tobacco: Never   Substance Use Topics   Alcohol use: No    Alcohol/week: 0.0 standard drinks   Drug use: No    Home Medications Prior to Admission medications   Medication Sig Start Date End Date Taking? Authorizing Provider  chlorthalidone (HYGROTON) 25 MG tablet Take 0.5 tablets (12.5 mg total) by mouth daily. 09/09/20   Tower, Wynelle Fanny, MD  triamcinolone cream (KENALOG) 0.1 % Apply 1 application topically 2 (two) times daily. Small amount to affected area 08/02/20   Tower, Wynelle Fanny, MD    Allergies    Calcium carbonate and Penicillins  Review of Systems   Review of Systems  Gastrointestinal:  Positive for abdominal pain.  Genitourinary:  Positive for flank pain.  Ten systems reviewed and are negative for acute change, except as noted in the HPI.    Physical Exam Updated Vital Signs BP 130/77 (BP Location: Left Arm)   Pulse 70   Temp 98.6 F (37 C) (Oral)   Resp 16   Ht '5\' 2"'$  (1.575 m)   Wt 118.8 kg   SpO2 100%   BMI 47.92 kg/m   Physical Exam Vitals and nursing note reviewed.  Constitutional:      General: She is not in acute distress.    Appearance: She is well-developed. She is not diaphoretic.     Comments: Nontoxic appearing and in NAD  HENT:     Head: Normocephalic and atraumatic.  Eyes:     General: No scleral icterus.    Conjunctiva/sclera: Conjunctivae normal.  Pulmonary:     Effort: Pulmonary effort is normal. No respiratory distress.     Comments: Respirations even and unlabored Abdominal:     Palpations: Abdomen is soft.     Tenderness: There is no abdominal tenderness. There is no guarding.     Comments: Soft, morbidly obese, nontender abdomen.  No peritoneal signs or guarding.  Exam limited secondary to habitus.  Musculoskeletal:        General: Normal range of motion.     Cervical back: Normal range of motion.     Comments: No appreciable spasm to the right lumbosacral paraspinal muscles.  No tenderness to the lumbosacral midline.  Skin:    General: Skin is  warm and dry.     Coloration: Skin is not pale.     Findings: No erythema or rash.  Neurological:     Mental Status: She is alert and oriented to person, place, and time.  Psychiatric:  Behavior: Behavior normal.    ED Results / Procedures / Treatments   Labs (all labs ordered are listed, but only abnormal results are displayed) Labs Reviewed  COMPREHENSIVE METABOLIC PANEL - Abnormal; Notable for the following components:      Result Value   Creatinine, Ser 1.09 (*)    GFR, Estimated 56 (*)    All other components within normal limits  URINALYSIS, ROUTINE W REFLEX MICROSCOPIC - Abnormal; Notable for the following components:   APPearance HAZY (*)    All other components within normal limits  LIPASE, BLOOD  CBC WITH DIFFERENTIAL/PLATELET    EKG None  Radiology No results found.  Procedures Procedures   Medications Ordered in ED Medications - No data to display  ED Course  I have reviewed the triage vital signs and the nursing notes.  Pertinent labs & imaging results that were available during my care of the patient were reviewed by me and considered in my medical decision making (see chart for details).    MDM Rules/Calculators/A&P                           70 year old female presents to the emergency department with complaints of right flank pain.  Symptoms initially began as abdominal discomfort, though patient has no reproducible abdominal tenderness.  She has not had any bowel changes, vomiting.  Her laboratory evaluation is reassuring.  No leukocytosis or fever to suggest infectious etiology.  No anemia to raise concern for acute/emergent blood loss.  Her gallbladder has previously been removed.  Kidney function preserved and no hematuria to suggest kidney stone.  Question muscular etiology of pain.  At this point, her symptoms have spontaneously improved; largely subsided.  Feel she is stable for follow-up with her primary care doctor.  Advised Tylenol and  ibuprofen for management of ongoing symptoms.  Return precautions discussed and provided. Patient discharged in stable condition with no unaddressed concerns.   Final Clinical Impression(s) / ED Diagnoses Final diagnoses:  Right flank pain    Rx / DC Orders ED Discharge Orders     None        Antonietta Breach, PA-C 10/25/20 0203    Fatima Blank, MD 10/25/20 2003

## 2020-10-26 ENCOUNTER — Telehealth: Payer: Self-pay

## 2020-10-26 NOTE — Telephone Encounter (Signed)
I reviewed her ER records and triage  Plan to see her Thursday but if symptoms worsen then she will need to go back to ER or an UC)

## 2020-10-26 NOTE — Telephone Encounter (Signed)
Pt called office; pt seen [facility] on 10/24/20 with rt lower side and back pain; pt was concerned could be appendicitis; pt said the pain got some better but today the pain has worsened again, pt has rt lower side pain that is constant and dull; pt said if walks or moves seems to make the side pain worse; pain level for side pain is 5. Pt also having lower rt back pain that is also dull and constant; pain level is also 5.  Pt has had urgency of urine and frequency but pt said she usually has frequency of urine that is no worse now. No burning pain when urinating and no fever. No N & V & D. Pt has not had fall or known injury. Pt said the lab work was OK at ED and they did not do any xrays. Pt said she cannot come to [facility] after 3 PM today or tomorrow due to pt sitting with her sister who has alzheimer's.pt said she does not want to go back to ED because of wait time and if were to go back does not think she should be charged for the visit. I advised I could not speak to the charge but pt wanted to know if Dr Glori Bickers could see pt on Thurs. Pt scheduled appt on 10/28/20 at 12 noon for 30' appt. UC & ED precautions given and pt voiced understanding. Sending note to Dr Glori Bickers and Riley CMA. [place] aware to make sure Dr Glori Bickers sees this note.

## 2020-10-28 ENCOUNTER — Other Ambulatory Visit: Payer: Self-pay

## 2020-10-28 ENCOUNTER — Other Ambulatory Visit: Payer: Self-pay | Admitting: Family Medicine

## 2020-10-28 ENCOUNTER — Ambulatory Visit (INDEPENDENT_AMBULATORY_CARE_PROVIDER_SITE_OTHER): Payer: Medicare HMO | Admitting: Family Medicine

## 2020-10-28 ENCOUNTER — Encounter: Payer: Self-pay | Admitting: Family Medicine

## 2020-10-28 VITALS — BP 136/70 | HR 63 | Temp 97.9°F | Ht 62.0 in | Wt 278.6 lb

## 2020-10-28 DIAGNOSIS — R21 Rash and other nonspecific skin eruption: Secondary | ICD-10-CM

## 2020-10-28 DIAGNOSIS — R109 Unspecified abdominal pain: Secondary | ICD-10-CM | POA: Diagnosis not present

## 2020-10-28 MED ORDER — TRIAMCINOLONE ACETONIDE 0.1 % EX CREA: 1.0000 "application " | TOPICAL_CREAM | Freq: Two times a day (BID) | CUTANEOUS | 0 refills | Status: DC

## 2020-10-28 MED ORDER — METAXALONE 800 MG PO TABS: 400.0000 mg | ORAL_TABLET | Freq: Three times a day (TID) | ORAL | 0 refills | Status: DC | PRN

## 2020-10-28 NOTE — Progress Notes (Signed)
Subjective:    Patient ID: Barbara Kramer, female    DOB: 02-10-1951, 70 y.o.   MRN: [id number]  This visit occurred during the SARS-CoV-2 public health emergency.  Safety protocols were in place, including screening questions prior to the visit, additional usage of staff PPE, and extensive cleaning of exam room while observing appropriate contact time as indicated for disinfecting solutions.   HPI Pt presents for f/u of ER visit on 8/722 for right flank and abdominal pain   Wt Readings from Last 3 Encounters:  10/28/20 278 lb 9 oz (126.4 kg)  10/24/20 262 lb (118.8 kg)  08/02/20 281 lb 4.8 oz (127.6 kg)   50.95 kg/m  She presented with mid abdominal pain that moved to right abd and flank without other GI symptoms or urinary symptoms  H/o ccy and hysterectomy   Per ER provider  Symptoms initially began as abdominal discomfort, though patient has no reproducible abdominal tenderness.  She has not had any bowel changes, vomiting.  Her laboratory evaluation is reassuring.  No leukocytosis or fever to suggest infectious etiology.  No anemia to raise concern for acute/emergent blood loss.  Her gallbladder has previously been removed.  Kidney function preserved and no hematuria to suggest kidney stone.  Question muscular etiology of pain.  At this point, her symptoms have spontaneously improved; largely subsided.  Feel she is stable for follow-up with her primary care doctor.  Advised Tylenol and ibuprofen for management of ongoing symptoms.  Return precautions discussed and provided. Patient discharged in stable condition with no unaddressed concerns.  Per pt abd hurt first and then back pain on the way to the ER Pain became severe in waiting room  Improved in hospital   Lab Results  Component Value Date   WBC 10.4 10/24/2020   HGB 14.0 10/24/2020   HCT 43.0 10/24/2020   MCV 86.9 10/24/2020   PLT 269 10/24/2020   Ua hazy but clear Lab Results  Component Value Date   LIPASE 34  10/24/2020   Lab Results  Component Value Date   NA 136 10/24/2020   K 3.8 10/24/2020   CO2 28 10/24/2020   GLUCOSE 94 10/24/2020   BUN 15 10/24/2020   CREATININE 1.09 (H) 10/24/2020   CALCIUM 9.8 10/24/2020   GFRNONAA 56 (L) 10/24/2020   GFRAA 81 (L) 05/10/2012    Pain was on and off after the ER visit (worse on Tuesday) She tried to put a pillow behind back on the right   She notes she has a pronounced low back curve  Today is quite a bit better   She noted belching at her sisters house also   She has never had a kidney stones Does need to drink more  fluids  Knows she has arthritis in her back   Patient Active Problem List   Diagnosis Date Noted   Right flank pain 10/28/2020   Rash of neck 08/02/2020   Essential hypertension 06/30/2020   Medicare annual wellness visit, subsequent 05/14/2020   SOB (shortness of breath) on exertion 05/14/2020   Unilateral primary osteoarthritis, left knee 05/10/2020   Morbid obesity with body mass index (BMI) of 50.0 to 59.9 in adult [facility]) 05/10/2020   Hand paresthesia 03/23/2020   Family history of colon cancer in mother    Benign neoplasm of colon    Welcome to Medicare preventive visit 01/29/2018   OSA (obstructive sleep apnea) 01/29/2018   Estrogen deficiency 01/29/2018   Allergic rhinitis 05/15/2017   Screening  mammogram, encounter for 10/11/2016   Snoring 10/11/2016   Prediabetes 12/15/2014   Osteoarthritis of left knee 10/16/2013   Left knee pain 10/15/2013   Colon cancer screening 11/20/2012   Routine gynecological examination 11/17/2011   Other screening mammogram 11/17/2011   Low back pain 10/18/2010   Routine general medical examination at a health care facility 10/06/2010   ACANTHOSIS NIGRICANS 01/27/2008   Eczema 08/02/2006   Past Medical History:  Diagnosis Date   Anemia    Arthritis    Breast lump    left   Eczema    Family hx of colon cancer    Gallbladder problem    Hypertension    Kidney cysts     small, right 10/2002   Normal cardiac stress test 11/2008   exercise stress test normal but limited by exercise intolerance   Obesity    Shortness of breath    Sleep apnea    Past Surgical History:  Procedure Laterality Date   CHOLECYSTECTOMY  05/2002   [place]- gallstones, small renal cyst 05/2002   COLONOSCOPY WITH PROPOFOL N/A 07/15/2019   Procedure: COLONOSCOPY WITH PROPOFOL;  Surgeon: Yetta Flock, MD;  Location: [facility];  Service: Gastroenterology;  Laterality: N/A;   FOOT SURGERY  11/1998   bunion   PARTIAL HYSTERECTOMY  1998   fibroids, ovaries remain   POLYPECTOMY  07/15/2019   Procedure: POLYPECTOMY;  Surgeon: Yetta Flock, MD;  Location: [facility];  Service: Gastroenterology;;   Social History   Tobacco Use   Smoking status: Former    Packs/day: 0.25    Years: 5.00    Pack years: 1.25    Types: Cigarettes    Quit date: 03/20/1976    Years since quitting: 44.6   Smokeless tobacco: Never  Substance Use Topics   Alcohol use: No    Alcohol/week: 0.0 standard drinks   Drug use: No   Family History  Problem Relation Age of Onset   Colon cancer Mother    Stomach cancer Mother    Cancer Mother    Lung cancer Father        smoker   Cancer Father    Hypertension Sister    Dementia Sister    Stroke Maternal Grandmother    Stroke Paternal Grandmother    Hypertension Sister    Colon polyps Daughter    Heart attack Maternal Grandfather    Allergies  Allergen Reactions   Calcium Carbonate     REACTION: constipation   Penicillins Rash    Has patient had a PCN reaction causing immediate rash, facial/tongue/throat swelling, SOB or lightheadedness with hypotension: no Has patient had a PCN reaction causing severe rash involving mucus membranes or skin necrosis: no Has patient had a PCN reaction that required hospitalization: no Has patient had a PCN reaction occurring within the last 10 years: no If all of the above answers are "NO", then may proceed  with Cephalosporin use.    Current Outpatient Medications on File Prior to Visit  Medication Sig Dispense Refill   chlorthalidone (HYGROTON) 25 MG tablet Take 0.5 tablets (12.5 mg total) by mouth daily. 45 tablet 2   No current facility-administered medications on file prior to visit.     Review of Systems  Constitutional:  Negative for activity change, appetite change, fatigue, fever and unexpected weight change.  HENT:  Negative for congestion, ear pain, rhinorrhea, sinus pressure and sore throat.   Eyes:  Negative for pain, redness and visual disturbance.  Respiratory:  Negative for cough, shortness of breath and wheezing.   Cardiovascular:  Negative for chest pain and palpitations.  Gastrointestinal:  Negative for abdominal pain, blood in stool, constipation, diarrhea and nausea.       Belching  Endocrine: Negative for polydipsia and polyuria.  Genitourinary:  Negative for dysuria, frequency and urgency.  Musculoskeletal:  Positive for back pain. Negative for arthralgias and myalgias.  Skin:  Negative for pallor and rash.  Allergic/Immunologic: Negative for environmental allergies.  Neurological:  Negative for dizziness, syncope and headaches.  Hematological:  Negative for adenopathy. Does not bruise/bleed easily.  Psychiatric/Behavioral:  Negative for decreased concentration and dysphoric mood. The patient is not nervous/anxious.       Objective:   Physical Exam Constitutional:      General: She is not in acute distress.    Appearance: Normal appearance. She is obese. She is not ill-appearing or diaphoretic.  Eyes:     Conjunctiva/sclera: Conjunctivae normal.     Pupils: Pupils are equal, round, and reactive to light.  Cardiovascular:     Rate and Rhythm: Normal rate and regular rhythm.     Heart sounds: Normal heart sounds.  Pulmonary:     Effort: Pulmonary effort is normal. No respiratory distress.     Breath sounds: Normal breath sounds. No wheezing.  Abdominal:      General: Abdomen is flat. Bowel sounds are normal. There is no distension.     Palpations: There is no mass.     Tenderness: There is no abdominal tenderness. There is right CVA tenderness. There is no left CVA tenderness, guarding or rebound.     Hernia: No hernia is present.  Musculoskeletal:     Cervical back: No tenderness.     Lumbar back: Tenderness present. No swelling, deformity, signs of trauma or bony tenderness. Decreased range of motion. Negative right straight leg raise test and negative left straight leg raise test. No scoliosis.     Comments: Pronounced lumbar lordosis (her baseline) No scoliosis noted  No bony tenderness Some tenderness in R lumbar musculature with tightness  Some mild R cva tenderness Flex 60 deg-some discomfort Ext 10-15 deg- more discomfort  Lateral bend is nl  Nl gait    Lymphadenopathy:     Cervical: No cervical adenopathy.  Skin:    Coloration: Skin is not pale.     Findings: No bruising, erythema or rash.  Neurological:     Mental Status: She is alert.     Sensory: No sensory deficit.     Motor: No weakness.     Coordination: Coordination normal.     Gait: Gait normal.  Psychiatric:        Mood and Affect: Mood normal.          Assessment & Plan:   Problem List Items Addressed This Visit       Musculoskeletal and Integument   Rash of neck    Pt lost her triamcinolone tube of cream  Re sent today          Other   Right flank pain - Primary    Originally abd/flank, now more in R low back  ER visit noted on 8/7  Reviewed hospital records, lab results and studies in detail   Per pt improved by the time they saw her  Exam/work up are consistent with muscle spasm/ MSK etiology  Less likely passed renal stone (no blood in urine)  Enc water intake  Heat prn and lumbar support  Skelaxin  px given to have on hand /use prn with caution of sedation  Handout re: back strain stretches to do when ready Re assuring exam  inst to  update if no further improvement  Also if symptoms return or any urinary/GI symptoms

## 2020-10-28 NOTE — Telephone Encounter (Signed)
See note on Rx. Metaxalone isn't covered but pharmacy said these alt meds are:  methocarbamol (ROBAXIN) 500 MG tablet baclofen (LIORESAL) 20 MG tablet cyclobenzaprine (FLEXERIL) 5 MG tablet carisoprodol (SOMA) 350 MG tablet tiZANidine (ZANAFLEX) 2 MG tablet  Please advise if you want to replace Rx with one of these or try and do a PA on the skelaxin   [facility] [address]

## 2020-10-28 NOTE — Assessment & Plan Note (Signed)
Pt lost her triamcinolone tube of cream  Re sent today

## 2020-10-28 NOTE — Assessment & Plan Note (Addendum)
Originally abd/flank, now more in R low back  ER visit noted on 8/7  Reviewed hospital records, lab results and studies in detail   Per pt improved by the time they saw her  Exam/work up are consistent with muscle spasm/ MSK etiology  Less likely passed renal stone (no blood in urine)  Enc water intake  Heat prn and lumbar support  Skelaxin px given to have on hand /use prn with caution of sedation  Handout re: back strain stretches to do when ready Re assuring exam  inst to update if no further improvement  Also if symptoms return or any urinary/GI symptoms

## 2020-10-28 NOTE — Patient Instructions (Signed)
Use gentle heat on your side and back as needed (10 minutes at a time)   Use lumbar support when you can if sitting  Walking may help also   Take a look at the stretches in the handout   You can try the muscle relaxer as needed if pain returns (caution of sedation -do not drive on it)  Update [place] if symptoms do not continue to improve or if getting worse

## 2020-11-05 DIAGNOSIS — Z0100 Encounter for examination of eyes and vision without abnormal findings: Secondary | ICD-10-CM | POA: Diagnosis not present

## 2020-12-09 ENCOUNTER — Ambulatory Visit: Payer: Medicare HMO | Admitting: Cardiology

## 2021-01-21 ENCOUNTER — Telehealth: Payer: Self-pay

## 2021-01-21 NOTE — Telephone Encounter (Signed)
[facility] Night - Client Nonclinical Telephone Record  [facility] Client [facility] Night - Client Client Site [facility] Physician Loura Pardon - MD Contact Type Call Who Is Calling Patient / Member / Family / Caregiver Caller Name Nakiya Rallis Caller Phone Number [phone] Patient Name Laiklyn Pilkenton Patient DOB October 29, 1950 Call Type Message Only Information Provided Reason for Call Request to Schedule Office Appointment Initial Comment Caller states she is needing to schedule an appointment. hours General info provided Patient request to speak to RN No Disp. Time Disposition Final User 01/21/2021 7:48:25 AM General Information Provided Yes Domingo Dimes, [place] Call Closed By: Memory Argue Transaction Date/Time: 01/21/2021 7:47:08 AM (ET

## 2021-01-21 NOTE — Telephone Encounter (Signed)
Per appt notes pt already has appt scheduled on 01/25/21 for vaginal cyst. Sending note to [place].

## 2021-01-25 ENCOUNTER — Ambulatory Visit (INDEPENDENT_AMBULATORY_CARE_PROVIDER_SITE_OTHER): Payer: Medicare HMO | Admitting: Family Medicine

## 2021-01-25 ENCOUNTER — Encounter: Payer: Self-pay | Admitting: Family Medicine

## 2021-01-25 ENCOUNTER — Other Ambulatory Visit: Payer: Self-pay

## 2021-01-25 DIAGNOSIS — N907 Vulvar cyst: Secondary | ICD-10-CM | POA: Diagnosis not present

## 2021-01-25 NOTE — Patient Instructions (Addendum)
I think you have a vulvar cyst   Drainage is normal  If painful/more discomfort please let me know Very common   Warm compress when you can Keep clean   If not improving in the next week let us know  If worse let me know

## 2021-01-25 NOTE — Progress Notes (Signed)
Subjective:    Patient ID: Barbara Kramer, female    DOB: Feb 16, 1951, 70 y.o.   MRN: [id number]  This visit occurred during the SARS-CoV-2 public health emergency.  Safety protocols were in place, including screening questions prior to the visit, additional usage of staff PPE, and extensive cleaning of exam room while observing appropriate contact time as indicated for disinfecting solutions.   HPI Pt presents for a cyst/vulva  Wt Readings from Last 3 Encounters:  01/25/21 279 lb (126.6 kg)  10/28/20 278 lb 9 oz (126.4 kg)  10/24/20 262 lb (118.8 kg)   51.03 kg/m  Has a cyst/? Boil   Noticed it on Friday  Right side  On labia  Hard  Oval/long  On inside  Some drainage  A little sore  Not throbbing or severe Used warm compress when washing   No vaginal d/c A tiny bit of itching  Used a little vaseline in the past   Clothing detergent- gain /new clings on underclothes   Different from ones in the past   H/o partial hysterectomy for fibroids   Patient Active Problem List   Diagnosis Date Noted   Vulvar cyst 01/25/2021   Right flank pain 10/28/2020   Rash of neck 08/02/2020   Essential hypertension 06/30/2020   Medicare annual wellness visit, subsequent 05/14/2020   SOB (shortness of breath) on exertion 05/14/2020   Unilateral primary osteoarthritis, left knee 05/10/2020   Morbid obesity with body mass index (BMI) of 50.0 to 59.9 in adult [facility]) 05/10/2020   Hand paresthesia 03/23/2020   Family history of colon cancer in mother    Benign neoplasm of colon    Welcome to Medicare preventive visit 01/29/2018   OSA (obstructive sleep apnea) 01/29/2018   Estrogen deficiency 01/29/2018   Allergic rhinitis 05/15/2017   Screening mammogram, encounter for 10/11/2016   Snoring 10/11/2016   Prediabetes 12/15/2014   Osteoarthritis of left knee 10/16/2013   Left knee pain 10/15/2013   Colon cancer screening 11/20/2012   Routine gynecological examination 11/17/2011    Other screening mammogram 11/17/2011   Low back pain 10/18/2010   Routine general medical examination at a health care facility 10/06/2010   ACANTHOSIS NIGRICANS 01/27/2008   Eczema 08/02/2006   Past Medical History:  Diagnosis Date   Anemia    Arthritis    Breast lump    left   Eczema    Family hx of colon cancer    Gallbladder problem    Hypertension    Kidney cysts    small, right 10/2002   Normal cardiac stress test 11/2008   exercise stress test normal but limited by exercise intolerance   Obesity    Shortness of breath    Sleep apnea    Past Surgical History:  Procedure Laterality Date   CHOLECYSTECTOMY  05/2002   [place]- gallstones, small renal cyst 05/2002   COLONOSCOPY WITH PROPOFOL N/A 07/15/2019   Procedure: COLONOSCOPY WITH PROPOFOL;  Surgeon: Yetta Flock, MD;  Location: [facility];  Service: Gastroenterology;  Laterality: N/A;   FOOT SURGERY  11/1998   bunion   PARTIAL HYSTERECTOMY  1998   fibroids, ovaries remain   POLYPECTOMY  07/15/2019   Procedure: POLYPECTOMY;  Surgeon: Yetta Flock, MD;  Location: [facility];  Service: Gastroenterology;;   Social History   Tobacco Use   Smoking status: Former    Packs/day: 0.25    Years: 5.00    Pack years: 1.25    Types: Cigarettes  Quit date: 03/20/1976    Years since quitting: 44.8   Smokeless tobacco: Never  Substance Use Topics   Alcohol use: No    Alcohol/week: 0.0 standard drinks   Drug use: No   Family History  Problem Relation Age of Onset   Colon cancer Mother    Stomach cancer Mother    Cancer Mother    Lung cancer Father        smoker   Cancer Father    Hypertension Sister    Dementia Sister    Stroke Maternal Grandmother    Stroke Paternal Grandmother    Hypertension Sister    Colon polyps Daughter    Heart attack Maternal Grandfather    Allergies  Allergen Reactions   Calcium Carbonate     REACTION: constipation   Penicillins Rash    Has patient had a PCN reaction  causing immediate rash, facial/tongue/throat swelling, SOB or lightheadedness with hypotension: no Has patient had a PCN reaction causing severe rash involving mucus membranes or skin necrosis: no Has patient had a PCN reaction that required hospitalization: no Has patient had a PCN reaction occurring within the last 10 years: no If all of the above answers are "NO", then may proceed with Cephalosporin use.    Current Outpatient Medications on File Prior to Visit  Medication Sig Dispense Refill   chlorthalidone (HYGROTON) 25 MG tablet Take 0.5 tablets (12.5 mg total) by mouth daily. 45 tablet 2   methocarbamol (ROBAXIN) 500 MG tablet Take 1 tablet (500 mg total) by mouth every 8 (eight) hours as needed for muscle spasms. Caution of sedation (Patient not taking: Reported on 01/25/2021) 30 tablet 0   triamcinolone cream (KENALOG) 0.1 % Apply 1 application topically 2 (two) times daily. Small amount to affected area (Patient not taking: Reported on 01/25/2021) 15 g 0   No current facility-administered medications on file prior to visit.    Review of Systems  Constitutional:  Negative for activity change, appetite change, fatigue, fever and unexpected weight change.  HENT:  Negative for congestion, ear pain, rhinorrhea, sinus pressure and sore throat.   Eyes:  Negative for pain, redness and visual disturbance.  Respiratory:  Negative for cough, shortness of breath and wheezing.   Cardiovascular:  Negative for chest pain and palpitations.  Gastrointestinal:  Negative for abdominal pain, blood in stool, constipation and diarrhea.  Endocrine: Negative for polydipsia and polyuria.  Genitourinary:  Positive for vaginal pain. Negative for dysuria, frequency, urgency, vaginal bleeding and vaginal discharge.  Musculoskeletal:  Negative for arthralgias, back pain and myalgias.  Skin:  Negative for pallor and rash.  Allergic/Immunologic: Negative for environmental allergies.  Neurological:  Negative for  dizziness, syncope and headaches.  Hematological:  Negative for adenopathy. Does not bruise/bleed easily.  Psychiatric/Behavioral:  Negative for decreased concentration and dysphoric mood. The patient is not nervous/anxious.       Objective:   Physical Exam Constitutional:      General: She is not in acute distress.    Appearance: Normal appearance. She is obese. She is not ill-appearing.  Cardiovascular:     Rate and Rhythm: Regular rhythm. Bradycardia present.  Genitourinary:    Labia:        Right: No rash.        Left: No rash.      Urethra: No prolapse or urethral swelling.     Vagina: No erythema, tenderness or bleeding.     Comments: 1 cm oval soft mass R posterior labia minora  Small opening with scant clear drainage Non tender No erythema or fluctuance  Nl vaginal mucosa  No swelling  Musculoskeletal:     Right lower leg: No edema.     Left lower leg: No edema.  Skin:    Findings: No erythema or rash.  Psychiatric:        Mood and Affect: Mood normal.          Assessment & Plan:   Problem List Items Addressed This Visit       Genitourinary   Vulvar cyst    Right labia minora/posterior Consistent with bartholin gland cyst (draining) No s/s of infection  Disc management with warm compress or sitz bath  Keep clean  Reassurance Handout given  Update if not starting to improve in a week or if worsening  Watch for erythema/inc size or pain

## 2021-01-25 NOTE — Assessment & Plan Note (Signed)
Right labia minora/posterior Consistent with bartholin gland cyst (draining) No s/s of infection  Disc management with warm compress or sitz bath  Keep clean  Reassurance Handout given  Update if not starting to improve in a week or if worsening  Watch for erythema/inc size or pain

## 2021-05-01 DIAGNOSIS — Z20822 Contact with and (suspected) exposure to covid-19: Secondary | ICD-10-CM | POA: Diagnosis not present

## 2021-05-09 ENCOUNTER — Telehealth: Payer: Self-pay | Admitting: Family Medicine

## 2021-05-09 DIAGNOSIS — I10 Essential (primary) hypertension: Secondary | ICD-10-CM

## 2021-05-09 DIAGNOSIS — R7303 Prediabetes: Secondary | ICD-10-CM

## 2021-05-09 NOTE — Telephone Encounter (Signed)
-----   Message from Ellamae Sia sent at 04/25/2021  9:26 AM EST ----- Regarding: lab orders for Tuesday, 2.21.23 Patient is scheduled for CPX labs, please order future labs, Thanks , Karna Christmas

## 2021-05-10 ENCOUNTER — Other Ambulatory Visit: Payer: Medicare HMO

## 2021-05-10 ENCOUNTER — Other Ambulatory Visit: Payer: Self-pay

## 2021-05-10 ENCOUNTER — Other Ambulatory Visit (INDEPENDENT_AMBULATORY_CARE_PROVIDER_SITE_OTHER): Payer: Medicare HMO

## 2021-05-10 DIAGNOSIS — R7303 Prediabetes: Secondary | ICD-10-CM

## 2021-05-10 DIAGNOSIS — I10 Essential (primary) hypertension: Secondary | ICD-10-CM | POA: Diagnosis not present

## 2021-05-10 LAB — COMPREHENSIVE METABOLIC PANEL
ALT: 9 U/L (ref 0–35)
AST: 12 U/L (ref 0–37)
Albumin: 4.1 g/dL (ref 3.5–5.2)
Alkaline Phosphatase: 63 U/L (ref 39–117)
BUN: 16 mg/dL (ref 6–23)
CO2: 29 mEq/L (ref 19–32)
Calcium: 9.7 mg/dL (ref 8.4–10.5)
Chloride: 105 mEq/L (ref 96–112)
Creatinine, Ser: 0.97 mg/dL (ref 0.40–1.20)
GFR: 60.1 mL/min (ref >60.00)
Glucose, Bld: 96 mg/dL (ref 70–99)
Potassium: 4.3 mEq/L (ref 3.5–5.1)
Sodium: 139 mEq/L (ref 135–145)
Total Bilirubin: 0.5 mg/dL (ref 0.2–1.2)
Total Protein: 7 g/dL (ref 6.0–8.3)

## 2021-05-10 LAB — CBC WITH DIFFERENTIAL/PLATELET
Basophils Absolute: 0 10*3/uL (ref 0.0–0.1)
Basophils Relative: 0.4 % (ref 0.0–3.0)
Eosinophils Absolute: 0.2 10*3/uL (ref 0.0–0.7)
Eosinophils Relative: 3 % (ref 0.0–5.0)
HCT: 39.6 % (ref 36.0–46.0)
Hemoglobin: 12.5 g/dL (ref 12.0–15.0)
Lymphocytes Relative: 30.4 % (ref 12.0–46.0)
Lymphs Abs: 2.2 10*3/uL (ref 0.7–4.0)
MCHC: 31.6 g/dL (ref 30.0–36.0)
MCV: 87.7 fl (ref 78.0–100.0)
Monocytes Absolute: 0.4 10*3/uL (ref 0.1–1.0)
Monocytes Relative: 5.8 % (ref 3.0–12.0)
Neutro Abs: 4.3 10*3/uL (ref 1.4–7.7)
Neutrophils Relative %: 60.4 % (ref 43.0–77.0)
Platelets: 240 10*3/uL (ref 150.0–400.0)
RBC: 4.51 Mil/uL (ref 3.87–5.11)
RDW: 14.9 % (ref 11.5–15.5)
WBC: 7.2 10*3/uL (ref 4.0–10.5)

## 2021-05-10 LAB — LIPID PANEL
Cholesterol: 148 mg/dL (ref 0–200)
HDL: 51.8 mg/dL (ref >39.00)
LDL Cholesterol: 84 mg/dL (ref 0–99)
NonHDL: 96.07
Total CHOL/HDL Ratio: 3
Triglycerides: 61 mg/dL (ref 0.0–149.0)
VLDL: 12.2 mg/dL (ref 0.0–40.0)

## 2021-05-10 LAB — TSH: TSH: 2.53 u[IU]/mL (ref 0.35–5.50)

## 2021-05-11 LAB — HEMOGLOBIN A1C
Hgb A1c MFr Bld: 4.7 % of total Hgb (ref <5.7)
Mean Plasma Glucose: 88 mg/dL
eAG (mmol/L): 4.9 mmol/L

## 2021-05-12 DIAGNOSIS — Z1231 Encounter for screening mammogram for malignant neoplasm of breast: Secondary | ICD-10-CM | POA: Diagnosis not present

## 2021-05-12 LAB — HM MAMMOGRAPHY: HM Mammogram: NORMAL (ref 0–4)

## 2021-05-17 ENCOUNTER — Encounter: Payer: Self-pay | Admitting: Family Medicine

## 2021-05-17 ENCOUNTER — Other Ambulatory Visit: Payer: Self-pay

## 2021-05-17 ENCOUNTER — Ambulatory Visit (INDEPENDENT_AMBULATORY_CARE_PROVIDER_SITE_OTHER): Payer: Medicare HMO | Admitting: Family Medicine

## 2021-05-17 VITALS — BP 128/66 | HR 69 | Temp 97.8°F | Ht 61.5 in | Wt 290.4 lb

## 2021-05-17 DIAGNOSIS — Z0000 Encounter for general adult medical examination without abnormal findings: Secondary | ICD-10-CM

## 2021-05-17 DIAGNOSIS — Z6841 Body Mass Index (BMI) 40.0 and over, adult: Secondary | ICD-10-CM | POA: Diagnosis not present

## 2021-05-17 DIAGNOSIS — I10 Essential (primary) hypertension: Secondary | ICD-10-CM | POA: Diagnosis not present

## 2021-05-17 DIAGNOSIS — R7303 Prediabetes: Secondary | ICD-10-CM | POA: Diagnosis not present

## 2021-05-17 DIAGNOSIS — Z1211 Encounter for screening for malignant neoplasm of colon: Secondary | ICD-10-CM | POA: Diagnosis not present

## 2021-05-17 NOTE — Patient Instructions (Addendum)
I recommend zyrtec 10 mg each bedtime as needed   If still congested you can add flonase nasal spray   If you are interested in the new shingles vaccine (Shingrix) - call your local pharmacy to check on coverage and availability  If affordable, get on a wait list at your pharmacy to get the vaccine.  For bone health/to prevent osteoporosis  Try to get 1200-1500 mg of calcium per day with at least 1000 iu of vitamin D - for bone health   Aim for 30 minutes of extra exercise daily  Silver sneakers/ Q vie (pedaler) - all of it is good   Please work on the advance directive and get it notarized

## 2021-05-17 NOTE — Assessment & Plan Note (Signed)
Reviewed health habits including diet and exercise and skin cancer prevention Reviewed appropriate screening tests for age  Also reviewed health mt list, fam hx and immunization status , as well as social and family history   See HPI Labs reviewed  Colonoscopy utd Had mammogram last week/pending report  Declines flu shot  Pt plans to look into shingrix vaccine Encouraged strongly to exercise  No falls or fractures and last dexa was normal  Give info to work on advance directive No cognitive concerns No hearing screen Stable vision screen/does not have her glasses today PHQ score of 0 No help needed with ADLs, nl function

## 2021-05-17 NOTE — Progress Notes (Signed)
Subjective:    Patient ID: Barbara Kramer, female    DOB: Dec 01, 1950, 71 y.o.   MRN: [id number]  This visit occurred during the SARS-CoV-2 public health emergency.  Safety protocols were in place, including screening questions prior to the visit, additional usage of staff PPE, and extensive cleaning of exam room while observing appropriate contact time as indicated for disinfecting solutions.   HPI Pt presents for amw and health mt visit  I have personally reviewed the Medicare Annual Wellness questionnaire and have noted 1. The patient's medical and social history 2. Their use of alcohol, tobacco or illicit drugs 3. Their current medications and supplements 4. The patient's functional ability including ADL's, fall risks, home safety risks and hearing or visual             impairment. 5. Diet and physical activities 6. Evidence for depression or mood disorders  The patients weight, height, BMI have been recorded in the chart and visual acuity is per eye clinic.  I have made referrals, counseling and provided education to the patient based review of the above and I have provided the pt with a written personalized care plan for preventive services. Reviewed and updated provider list, see scanned forms.  See scanned forms.  Routine anticipatory guidance given to patient.  See health maintenance. Colon cancer screening  colonoscopy 06/2019   mother had colon cancer  Breast cancer screening  mammogram 04/2020 -had one last week   Self breast exam: no changes  Flu vaccine:not this season , declines  Covid immunized a year ago Tetanus vaccine  04/2015 Tdap Pneumovax up to date  Zoster vaccine: will get at pharmacy if covered  Dexa 02/2019 normal BMD Falls: none Fractures: none  Supplements-none Exercise :  none  She has ability to use silver sneakers and has a pedaler   Advance directive: has living will but not poa  Cognitive function addressed- see scanned forms- and if abnormal  then additional documentation follows.  No concerns at all   Still working  No memory or cognitive concerns   PMH and SH reviewed  Meds, vitals, and allergies reviewed.   ROS: See HPI.  Otherwise negative.    Weight : Wt Readings from Last 3 Encounters:  05/17/21 290 lb 6 oz (131.7 kg)  01/25/21 279 lb (126.6 kg)  10/28/20 278 lb 9 oz (126.4 kg)   53.98 kg/m  She was eating out a lot  Now started prepping food at home  Plans to cut back on fried foods   Hearing/vision: Hearing Screening   500Hz  1000Hz  2000Hz  4000Hz   Right ear 40 40 40 40  Left ear 40 40 40 40   Vision Screening   Right eye Left eye Both eyes  Without correction 20/40 20/30 20/25   With correction      Wears glasses and time for a visit  Not wearing them right now    PHQ: Depression screen [facility] 2/9 05/17/2021 05/20/2020 05/14/2020 03/23/2020 03/11/2019  Decreased Interest 0 1 0 0 0  Down, Depressed, Hopeless 0 0 0 0 0  PHQ - 2 Score 0 1 0 0 0  Altered sleeping - 0 - - 0  Tired, decreased energy - 2 - - 0  Change in appetite - 0 - - 0  Feeling bad or failure about yourself  - 0 - - 0  Trouble concentrating - 0 - - 0  Moving slowly or fidgety/restless - 3 - - 0  Suicidal thoughts - 0 - -  0  PHQ-9 Score - 6 - - 0  Difficult doing work/chores - Not difficult at all - - Not difficult at all   She still works   Allergies bother her  Tree pollen is high   ADLs: no help needed  Functionality:excellent   Care team   HTN bp is stable today  No cp or palpitations or headaches or edema  No side effects to medicines  BP Readings from Last 3 Encounters:  05/17/21 128/66  01/25/21 136/82  10/28/20 136/70    Chlorthalidone 12.5 mg daily in the past /stopped it because a family member died after allergic rxn to a blood pressure medicine  Now off of it   Prediabetes Lab Results  Component Value Date   HGBA1C 4.7 05/10/2021  Good, trying to eat better   OSA-using cpap  Cholesterol Lab  Results  Component Value Date   CHOL 148 05/10/2021   CHOL 163 03/16/2020   CHOL 143 03/11/2019   Lab Results  Component Value Date   HDL 51.80 05/10/2021   HDL 58.40 03/16/2020   HDL 53.00 03/11/2019   Lab Results  Component Value Date   LDLCALC 84 05/10/2021   LDLCALC 92 03/16/2020   LDLCALC 80 03/11/2019   Lab Results  Component Value Date   TRIG 61.0 05/10/2021   TRIG 64.0 03/16/2020   TRIG 49.0 03/11/2019   Lab Results  Component Value Date   CHOLHDL 3 05/10/2021   CHOLHDL 3 03/16/2020   CHOLHDL 3 03/11/2019   No results found for: LDLDIRECT Fairly stable   Other labs Lab Results  Component Value Date   CREATININE 0.97 05/10/2021   BUN 16 05/10/2021   NA 139 05/10/2021   K 4.3 05/10/2021   CL 105 05/10/2021   CO2 29 05/10/2021   Lab Results  Component Value Date   ALT 9 05/10/2021   AST 12 05/10/2021   ALKPHOS 63 05/10/2021   BILITOT 0.5 05/10/2021   Lab Results  Component Value Date   WBC 7.2 05/10/2021   HGB 12.5 05/10/2021   HCT 39.6 05/10/2021   MCV 87.7 05/10/2021   PLT 240.0 05/10/2021   Lab Results  Component Value Date   TSH 2.53 05/10/2021    Patient Active Problem List   Diagnosis Date Noted   Vulvar cyst 01/25/2021   Rash of neck 08/02/2020   Essential hypertension 06/30/2020   Medicare annual wellness visit, subsequent 05/14/2020   Unilateral primary osteoarthritis, left knee 05/10/2020   Morbid obesity with body mass index (BMI) of 50.0 to 59.9 in adult [facility]) 05/10/2020   Family history of colon cancer in mother    Benign neoplasm of colon    Welcome to Medicare preventive visit 01/29/2018   OSA (obstructive sleep apnea) 01/29/2018   Estrogen deficiency 01/29/2018   Allergic rhinitis 05/15/2017   Screening mammogram, encounter for 10/11/2016   Prediabetes 12/15/2014   Osteoarthritis of left knee 10/16/2013   Colon cancer screening 11/20/2012   Routine gynecological examination 11/17/2011   Other screening mammogram  11/17/2011   Low back pain 10/18/2010   Routine general medical examination at a health care facility 10/06/2010   ACANTHOSIS NIGRICANS 01/27/2008   Eczema 08/02/2006   Past Medical History:  Diagnosis Date   Anemia    Arthritis    Breast lump    left   Eczema    Family hx of colon cancer    Gallbladder problem    Hypertension    Kidney cysts  small, right 10/2002   Normal cardiac stress test 11/2008   exercise stress test normal but limited by exercise intolerance   Obesity    Shortness of breath    Sleep apnea    Past Surgical History:  Procedure Laterality Date   CHOLECYSTECTOMY  05/2002   [place]- gallstones, small renal cyst 05/2002   COLONOSCOPY WITH PROPOFOL N/A 07/15/2019   Procedure: COLONOSCOPY WITH PROPOFOL;  Surgeon: Yetta Flock, MD;  Location: [facility];  Service: Gastroenterology;  Laterality: N/A;   FOOT SURGERY  11/1998   bunion   PARTIAL HYSTERECTOMY  1998   fibroids, ovaries remain   POLYPECTOMY  07/15/2019   Procedure: POLYPECTOMY;  Surgeon: Yetta Flock, MD;  Location: [facility];  Service: Gastroenterology;;   Social History   Tobacco Use   Smoking status: Former    Packs/day: 0.25    Years: 5.00    Pack years: 1.25    Types: Cigarettes    Quit date: 03/20/1976    Years since quitting: 45.1   Smokeless tobacco: Never  Substance Use Topics   Alcohol use: No    Alcohol/week: 0.0 standard drinks   Drug use: No   Family History  Problem Relation Age of Onset   Colon cancer Mother    Stomach cancer Mother    Cancer Mother    Lung cancer Father        smoker   Cancer Father    Hypertension Sister    Dementia Sister    Stroke Maternal Grandmother    Stroke Paternal Grandmother    Hypertension Sister    Colon polyps Daughter    Heart attack Maternal Grandfather    Allergies  Allergen Reactions   Calcium Carbonate     REACTION: constipation   Penicillins Rash    Has patient had a PCN reaction causing immediate rash,  facial/tongue/throat swelling, SOB or lightheadedness with hypotension: no Has patient had a PCN reaction causing severe rash involving mucus membranes or skin necrosis: no Has patient had a PCN reaction that required hospitalization: no Has patient had a PCN reaction occurring within the last 10 years: no If all of the above answers are "NO", then may proceed with Cephalosporin use.    Current Outpatient Medications on File Prior to Visit  Medication Sig Dispense Refill   methocarbamol (ROBAXIN) 500 MG tablet Take 1 tablet (500 mg total) by mouth every 8 (eight) hours as needed for muscle spasms. Caution of sedation 30 tablet 0   triamcinolone cream (KENALOG) 0.1 % Apply 1 application topically 2 (two) times daily. Small amount to affected area 15 g 0   No current facility-administered medications on file prior to visit.    Review of Systems  Constitutional:  Positive for fatigue. Negative for activity change, appetite change, fever and unexpected weight change.  HENT:  Negative for congestion, ear pain, rhinorrhea, sinus pressure and sore throat.   Eyes:  Negative for pain, redness and visual disturbance.  Respiratory:  Negative for cough, shortness of breath and wheezing.   Cardiovascular:  Negative for chest pain and palpitations.  Gastrointestinal:  Negative for abdominal pain, blood in stool, constipation and diarrhea.  Endocrine: Negative for polydipsia and polyuria.  Genitourinary:  Negative for dysuria, frequency and urgency.  Musculoskeletal:  Positive for arthralgias. Negative for back pain and myalgias.  Skin:  Negative for pallor and rash.  Allergic/Immunologic: Negative for environmental allergies.  Neurological:  Negative for dizziness, syncope and headaches.  Hematological:  Negative for adenopathy.  Does not bruise/bleed easily.  Psychiatric/Behavioral:  Negative for decreased concentration and dysphoric mood. The patient is not nervous/anxious.       Objective:    Physical Exam Constitutional:      General: She is not in acute distress.    Appearance: Normal appearance. She is well-developed. She is obese. She is not ill-appearing or diaphoretic.  HENT:     Head: Normocephalic and atraumatic.     Right Ear: Tympanic membrane, ear canal and external ear normal.     Left Ear: Tympanic membrane, ear canal and external ear normal.     Nose: Nose normal. No congestion.     Mouth/Throat:     Mouth: Mucous membranes are moist.     Pharynx: Oropharynx is clear. No posterior oropharyngeal erythema.  Eyes:     General: No scleral icterus.    Extraocular Movements: Extraocular movements intact.     Conjunctiva/sclera: Conjunctivae normal.     Pupils: Pupils are equal, round, and reactive to light.  Neck:     Thyroid: No thyromegaly.     Vascular: No carotid bruit or JVD.  Cardiovascular:     Rate and Rhythm: Normal rate and regular rhythm.     Pulses: Normal pulses.     Heart sounds: Normal heart sounds.    No gallop.  Pulmonary:     Effort: Pulmonary effort is normal. No respiratory distress.     Breath sounds: Normal breath sounds. No wheezing.     Comments: Good air exch Chest:     Chest wall: No tenderness.  Abdominal:     General: Bowel sounds are normal. There is no distension or abdominal bruit.     Palpations: Abdomen is soft. There is no mass.     Tenderness: There is no abdominal tenderness.     Hernia: No hernia is present.  Genitourinary:    Comments: Breast exam: No mass, nodules, thickening, tenderness, bulging, retraction, inflamation, nipple discharge or skin changes noted.  No axillary or clavicular LA.     Musculoskeletal:        General: No tenderness. Normal range of motion.     Cervical back: Normal range of motion and neck supple. No rigidity. No muscular tenderness.     Right lower leg: No edema.     Left lower leg: No edema.     Comments: No kyphosis   Lymphadenopathy:     Cervical: No cervical adenopathy.  Skin:     General: Skin is warm and dry.     Coloration: Skin is not pale.     Findings: No erythema or rash.     Comments: Few SKs  Neurological:     Mental Status: She is alert. Mental status is at baseline.     Cranial Nerves: No cranial nerve deficit.     Motor: No abnormal muscle tone.     Coordination: Coordination normal.     Gait: Gait normal.     Deep Tendon Reflexes: Reflexes are normal and symmetric. Reflexes normal.  Psychiatric:        Mood and Affect: Mood normal.        Cognition and Memory: Cognition and memory normal.          Assessment & Plan:   Problem List Items Addressed This Visit       Cardiovascular and Mediastinum   Essential hypertension    bp in fair control at this time  BP Readings from Last 1 Encounters:  05/17/21 128/66  No changes needed-no longer taking chlorthalidone (she was afraid of it) and bp is still in nl range Will continue to monitor Most recent labs reviewed  Disc lifstyle change with low sodium diet and exercise          Other   Colon cancer screening    Screening colonoscopy utd from 06/2019 5 y recall for family history       Medicare annual wellness visit, subsequent - Primary    Reviewed health habits including diet and exercise and skin cancer prevention Reviewed appropriate screening tests for age  Also reviewed health mt list, fam hx and immunization status , as well as social and family history   See HPI Labs reviewed  Colonoscopy utd Had mammogram last week/pending report  Declines flu shot  Pt plans to look into shingrix vaccine Encouraged strongly to exercise  No falls or fractures and last dexa was normal  Give info to work on advance directive No cognitive concerns No hearing screen Stable vision screen/does not have her glasses today PHQ score of 0 No help needed with ADLs, nl function       Morbid obesity with body mass index (BMI) of 50.0 to 59.9 in adult [facility])    Discussed how this problem influences  overall health and the risks it imposes  Reviewed plan for weight loss with lower calorie diet (via better food choices and also portion control or program like weight watchers) and exercise building up to or more than 30 minutes 5 days per week including some aerobic activity   Encouraged pt strongly to work up to regular exercise       Prediabetes    Lab Results  Component Value Date   HGBA1C 4.7 05/10/2021  This is improved  disc imp of low glycemic diet and wt loss to prevent DM2       Routine general medical examination at a health care facility    Reviewed health habits including diet and exercise and skin cancer prevention Reviewed appropriate screening tests for age  Also reviewed health mt list, fam hx and immunization status , as well as social and family history   See HPI Labs reviewed  Colonoscopy utd Had mammogram last week/pending report  Declines flu shot  Pt plans to look into shingrix vaccine Encouraged strongly to exercise  No falls or fractures and last dexa was normal  Give info to work on advance directive No cognitive concerns No hearing screen Stable vision screen/does not have her glasses today PHQ score of 0 No help needed with ADLs, nl function

## 2021-05-17 NOTE — Assessment & Plan Note (Addendum)
Screening colonoscopy utd from 06/2019 5 y recall for family history

## 2021-05-17 NOTE — Assessment & Plan Note (Signed)
Discussed how this problem influences overall health and the risks it imposes  Reviewed plan for weight loss with lower calorie diet (via better food choices and also portion control or program like weight watchers) and exercise building up to or more than 30 minutes 5 days per week including some aerobic activity   Encouraged pt strongly to work up to regular exercise

## 2021-05-17 NOTE — Assessment & Plan Note (Signed)
bp in fair control at this time  BP Readings from Last 1 Encounters:  05/17/21 128/66   No changes needed-no longer taking chlorthalidone (she was afraid of it) and bp is still in nl range Will continue to monitor Most recent labs reviewed  Disc lifstyle change with low sodium diet and exercise

## 2021-05-17 NOTE — Assessment & Plan Note (Signed)
Lab Results  Component Value Date   HGBA1C 4.7 05/10/2021   This is improved  disc imp of low glycemic diet and wt loss to prevent DM2

## 2021-05-19 ENCOUNTER — Encounter: Payer: Self-pay | Admitting: Family Medicine

## 2021-06-10 ENCOUNTER — Encounter: Payer: Self-pay | Admitting: Family Medicine

## 2021-06-10 NOTE — Telephone Encounter (Signed)
Form printed and placed in your inbox for review  

## 2021-06-28 ENCOUNTER — Ambulatory Visit (INDEPENDENT_AMBULATORY_CARE_PROVIDER_SITE_OTHER): Payer: Medicare HMO | Admitting: Family Medicine

## 2021-06-28 ENCOUNTER — Encounter: Payer: Self-pay | Admitting: Family Medicine

## 2021-06-28 VITALS — BP 132/68 | HR 70 | Temp 97.9°F | Ht 61.5 in | Wt 292.4 lb

## 2021-06-28 DIAGNOSIS — R519 Headache, unspecified: Secondary | ICD-10-CM | POA: Diagnosis not present

## 2021-06-28 DIAGNOSIS — R202 Paresthesia of skin: Secondary | ICD-10-CM | POA: Insufficient documentation

## 2021-06-28 DIAGNOSIS — J309 Allergic rhinitis, unspecified: Secondary | ICD-10-CM

## 2021-06-28 NOTE — Assessment & Plan Note (Signed)
This was brief and assoc with pressure in L face ?No speech change or dizziness or other symptoms  ? ?Nl exam ?May be due to sinus congestion, will tx this and monitor ?S/s of cva discussed to watch for  ?Update if not starting to improve in a week or if worsening   ?Consider imaging if not imp ?

## 2021-06-28 NOTE — Progress Notes (Signed)
? ?Subjective:  ? ? Patient ID: Barbara Kramer, female    DOB: 1950/11/25, 71 y.o.   MRN: [id number] ? ?HPI ?Pt presents to discuss a head symptoms and allergies ? ? ?Wt Readings from Last 3 Encounters:  ?06/28/21 292 lb 6 oz (132.6 kg)  ?05/17/21 290 lb 6 oz (131.7 kg)  ?01/25/21 279 lb (126.6 kg)  ? ?54.35 kg/m? ? ?Feels like something is going on with her head ?Very anxious about that  ? ?Several sharp pains/fleeting  (a quick sharp pain)  ?Top L scalp , sharp pain  ?Over a month ago  ?A month in between ?She noticed it but did not stop what she was doing  ?Was sitting  ? ? ?One day something felt odd in L side of her face /not pain  (a few weeks ago) ?Light /numbness sensation  ?Less than an hour  ? ?Some allergy/sinus congestion  ?Worse in the spring / worse each season  ?Nose is very congestion (worse outside)  ?Mucous is clear  ?No pain in face  ?No ST ?Ears are ok  ?Occ cough /pollen in throat  ?Sneezes a lot  ?Runny nose  ? ?Otc: ?Menthol cough drops  ?Has used nasal spray once (store brand flonase)  ?Has not used an antihistamine  ? ?No facial droop or speech problems or confusion  ?No headache  ?No dizziness  ? ?Occ fingers get a little numb and tingly  ?This worsens in bed if pillow is not high  ? ?No h/o TMJ or teeth grind  ? ?No new otc meds or supplements  ?Patient Active Problem List  ? Diagnosis Date Noted  ? Head pain 06/28/2021  ? Vulvar cyst 01/25/2021  ? Rash of neck 08/02/2020  ? Essential hypertension 06/30/2020  ? Medicare annual wellness visit, subsequent 05/14/2020  ? Unilateral primary osteoarthritis, left knee 05/10/2020  ? Morbid obesity with body mass index (BMI) of 50.0 to 59.9 in adult [facility]) 05/10/2020  ? Family history of colon cancer in mother   ? Benign neoplasm of colon   ? Welcome to Medicare preventive visit 01/29/2018  ? OSA (obstructive sleep apnea) 01/29/2018  ? Estrogen deficiency 01/29/2018  ? Allergic rhinitis 05/15/2017  ? Screening mammogram, encounter for 10/11/2016   ? Prediabetes 12/15/2014  ? Osteoarthritis of left knee 10/16/2013  ? Colon cancer screening 11/20/2012  ? Routine gynecological examination 11/17/2011  ? Other screening mammogram 11/17/2011  ? Low back pain 10/18/2010  ? Routine general medical examination at a health care facility 10/06/2010  ? 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 Eczema 08/02/2006  ? ?Past Medical History:  ?Diagnosis Date  ? Anemia   ? Arthritis   ? Breast lump   ? left  ? Eczema   ? Family hx of colon cancer   ? Gallbladder problem   ? Hypertension   ? Kidney cysts   ? small, right 10/2002  ? Normal cardiac stress test 11/2008  ? exercise stress test normal but limited by exercise intolerance  ? Obesity   ? Shortness of breath   ? Sleep apnea   ? ?Past Surgical History:  ?Procedure Laterality Date  ? CHOLECYSTECTOMY  05/2002  ? [place]- gallstones, small renal cyst 05/2002  ? COLONOSCOPY WITH PROPOFOL N/A 07/15/2019  ? Procedure: COLONOSCOPY WITH PROPOFOL;  Surgeon: Yetta Flock, MD;  Location: [facility];  Service: Gastroenterology;  Laterality: N/A;  ? FOOT SURGERY  11/1998  ? bunion  ? PARTIAL HYSTERECTOMY  1998  ? fibroids,  ovaries remain  ? POLYPECTOMY  07/15/2019  ? Procedure: POLYPECTOMY;  Surgeon: Yetta Flock, MD;  Location: [facility];  Service: Gastroenterology;;  ? ?Social History  ? ?Tobacco Use  ? Smoking status: Former  ?  Packs/day: 0.25  ?  Years: 5.00  ?  Pack years: 1.25  ?  Types: Cigarettes  ?  Quit date: 03/20/1976  ?  Years since quitting: 45.3  ? Smokeless tobacco: Never  ?Substance Use Topics  ? Alcohol use: No  ?  Alcohol/week: 0.0 standard drinks  ? Drug use: No  ? ?Family History  ?Problem Relation Age of Onset  ? Colon cancer Mother   ? Stomach cancer Mother   ? Cancer Mother   ? Lung cancer Father   ?     smoker  ? Cancer Father   ? Hypertension Sister   ? Dementia Sister   ? Stroke Maternal Grandmother   ? Stroke Paternal Grandmother   ? Hypertension Sister   ? Colon polyps Daughter   ? Heart attack  Maternal Grandfather   ? ?Allergies  ?Allergen Reactions  ? Calcium Carbonate   ?  REACTION: constipation  ? Penicillins Rash  ?  Has patient had a PCN reaction causing immediate rash, facial/tongue/throat swelling, SOB or lightheadedness with hypotension: no ?Has patient had a PCN reaction causing severe rash involving mucus membranes or skin necrosis: no ?Has patient had a PCN reaction that required hospitalization: no ?Has patient had a PCN reaction occurring within the last 10 years: no ?If all of the above answers are "NO", then may proceed with Cephalosporin use. ?  ? ?Current Outpatient Medications on File Prior to Visit  ?Medication Sig Dispense Refill  ? triamcinolone cream (KENALOG) 0.1 % Apply 1 application topically 2 (two) times daily. Small amount to affected area (Patient not taking: Reported on 06/28/2021) 15 g 0  ? ?No current facility-administered medications on file prior to visit.  ?  ? ?Review of Systems  ?HENT:  Positive for congestion, postnasal drip, rhinorrhea and sinus pressure. Negative for hearing loss, sinus pain, trouble swallowing and voice change.   ?Cardiovascular:  Negative for chest pain and leg swelling.  ?Neurological:  Negative for dizziness, tremors, seizures, syncope, facial asymmetry, speech difficulty, weakness, light-headedness, numbness and headaches.  ? ?   ?Objective:  ? Physical Exam ?Constitutional:   ?   General: She is not in acute distress. ?   Appearance: Normal appearance. She is well-developed. She is obese. She is not ill-appearing or diaphoretic.  ?HENT:  ?   Head: Normocephalic and atraumatic.  ?   Comments: No facial tenderness ?No temporal or scalp tenderness ?   Right Ear: Tympanic membrane, ear canal and external ear normal.  ?   Left Ear: Tympanic membrane, ear canal and external ear normal.  ?   Nose: Congestion and rhinorrhea present.  ?   Mouth/Throat:  ?   Mouth: Mucous membranes are moist.  ?   Pharynx: Oropharynx is clear. No oropharyngeal exudate or  posterior oropharyngeal erythema.  ?Eyes:  ?   General: No scleral icterus.    ?   Right eye: No discharge.     ?   Left eye: No discharge.  ?   Conjunctiva/sclera: Conjunctivae normal.  ?   Pupils: Pupils are equal, round, and reactive to light.  ?   Comments: No nystagmus  ?Neck:  ?   Thyroid: No thyromegaly.  ?   Vascular: No carotid bruit or JVD.  ?  Trachea: No tracheal deviation.  ?Cardiovascular:  ?   Rate and Rhythm: Normal rate and regular rhythm.  ?   Heart sounds: Normal heart sounds. No murmur heard. ?Pulmonary:  ?   Effort: Pulmonary effort is normal. No respiratory distress.  ?   Breath sounds: Normal breath sounds. No wheezing or rales.  ?Abdominal:  ?   General: Bowel sounds are normal. There is no distension.  ?   Palpations: Abdomen is soft. There is no mass.  ?   Tenderness: There is no abdominal tenderness.  ?Musculoskeletal:     ?   General: No tenderness.  ?   Cervical back: Full passive range of motion without pain, normal range of motion and neck supple.  ?Lymphadenopathy:  ?   Cervical: No cervical adenopathy.  ?Skin: ?   General: Skin is warm and dry.  ?   Coloration: Skin is not pale.  ?   Findings: No rash.  ?Neurological:  ?   Mental Status: She is alert and oriented to person, place, and time.  ?   Cranial Nerves: No cranial nerve deficit, dysarthria or facial asymmetry.  ?   Sensory: Sensation is intact. No sensory deficit.  ?   Motor: Motor function is intact. No weakness, tremor, atrophy or abnormal muscle tone.  ?   Coordination: Coordination is intact. Romberg sign negative. Coordination normal. Finger-Nose-Finger Test and Heel to [facility] Test normal.  ?   Gait: Gait is intact. Gait normal.  ?   Deep Tendon Reflexes: Reflexes are normal and symmetric. Reflexes normal.  ?   Comments: No focal cerebellar signs   ?Psychiatric:     ?   Mood and Affect: Mood normal.     ?   Behavior: Behavior normal.     ?   Thought Content: Thought content normal.  ? ? ? ? ? ?   ?Assessment & Plan:   ? ?Problem List Items Addressed This Visit   ? ?  ? Respiratory  ? Allergic rhinitis - Primary  ?  Suspect sinus fullness may have caused L sided facial pressure (now resolved) ?Significant congestion/runny nos

## 2021-06-28 NOTE — Assessment & Plan Note (Signed)
Suspect sinus fullness may have caused L sided facial pressure (now resolved) ?Significant congestion/runny nose and sneezing  ?Plan to start back on flonase ?Add zyrtec 10 mg daily in evening ?Pollen avoidance ?Update if not starting to improve in a week or if worsening   ? ?

## 2021-06-28 NOTE — Assessment & Plan Note (Signed)
Fleeting pain /sharp in L side of upper scalp on 2 occasions  ?No other neuro symptoms ?Nl exam today  ?Will monitor ?Consider imaging if this occurs again  ?S/s of cva discussed in detail with inst to call 911 ? ?

## 2021-06-28 NOTE — Patient Instructions (Signed)
Use your flonase nasal spray (fluticasone) 2 sprays in each nostril once daily  ?This will take 1-2 weeks to work  ? ?Also add antihistamine  ?My favorite is zyrtec (cetrizine) -take 10 mg every night  ? ?If too sedating let us know, you could try claritin or allegra  ? ?Make sure your pillow puts you in good head position  ? ?If not significantly improved in 2 weeks let us know  ?If the head pain or facial symptoms return let us know  ? ?If you ever have stroke symptoms call 911 ?Facial droop /speech problems/confusion /one sided numbness or weakness  ?

## 2021-07-27 ENCOUNTER — Encounter: Payer: Self-pay | Admitting: Family Medicine

## 2021-07-29 ENCOUNTER — Encounter: Payer: Self-pay | Admitting: Gastroenterology

## 2021-08-01 ENCOUNTER — Ambulatory Visit (INDEPENDENT_AMBULATORY_CARE_PROVIDER_SITE_OTHER): Payer: Medicare HMO | Admitting: Family Medicine

## 2021-08-01 ENCOUNTER — Encounter: Payer: Self-pay | Admitting: Family Medicine

## 2021-08-01 VITALS — BP 124/80 | HR 69 | Ht 61.5 in | Wt 293.8 lb

## 2021-08-01 DIAGNOSIS — Z7409 Other reduced mobility: Secondary | ICD-10-CM

## 2021-08-01 DIAGNOSIS — M1712 Unilateral primary osteoarthritis, left knee: Secondary | ICD-10-CM

## 2021-08-01 DIAGNOSIS — Z6841 Body Mass Index (BMI) 40.0 and over, adult: Secondary | ICD-10-CM | POA: Diagnosis not present

## 2021-08-01 NOTE — Progress Notes (Signed)
? ?Subjective:  ? ? Patient ID: Barbara Kramer, female    DOB: [birth year], 71 y.o.   MRN: [id number] ? ?HPI ?Pt presents for mobility impairment  ?Needs and walker and a shower seat  ? ?Wt Readings from Last 3 Encounters:  ?08/01/21 293 lb 12.8 oz (133.3 kg)  ?06/28/21 292 lb 6 oz (132.6 kg)  ?05/17/21 290 lb 6 oz (131.7 kg)  ? ?54.61 kg/m? ? ?Arthritis keeps her from walking long distances  ? ?She has an upright walker /does not fit her well and would like a new one  ? ?Has a hard time getting up from sitting  ?Cannot reach feet when bathing  ? ? ?Wants to get a seated wheelec walker and shower chair  ?Cannot tie shoes or put lotion on feet  ? ?Lumbar disc dz and OA  ? ?Back hurts ?Knees hurt and give out unexpectedly   (but no falls)  ?Left leg -worse at night  ? ?She can walk less than 5 minutes without stopping to rest  ?Has to take breaks walking to her mailbox (just a regular driveway)  ?Also getting up steps , 4 steps (has to come down backwards)  ?None in the house  ? ?Takes a lot time to get out of a chair without arms ?She tries to get up frequently /sets a timer, to keep from getting too stiff  ? ?Gets very stiff trying to get up  ? ?Saw Dr Ninfa Linden - 2/ 2022  ?L knee OA (worse in the L)  ?Needs a knee replacement but obesity limits  ? ?Not taking anything  ?Not ready for injections  ? ?Patient Active Problem List  ? Diagnosis Date Noted  ? Mobility impaired 08/01/2021  ? Head pain 06/28/2021  ? Facial tingling 06/28/2021  ? Vulvar cyst 01/25/2021  ? Rash of neck 08/02/2020  ? Essential hypertension 06/30/2020  ? Medicare annual wellness visit, subsequent 05/14/2020  ? Unilateral primary osteoarthritis, left knee 05/10/2020  ? Morbid obesity with body mass index (BMI) of 50.0 to 59.9 in adult [facility]) 05/10/2020  ? Family history of colon cancer in mother   ? Benign neoplasm of colon   ? Welcome to Medicare preventive visit 01/29/2018  ? OSA (obstructive sleep apnea) 01/29/2018  ? Estrogen deficiency  01/29/2018  ? Allergic rhinitis 05/15/2017  ? Screening mammogram, encounter for 10/11/2016  ? Prediabetes 12/15/2014  ? Osteoarthritis of left knee 10/16/2013  ? Colon cancer screening 11/20/2012  ? Routine gynecological examination 11/17/2011  ? Other screening mammogram 11/17/2011  ? Low back pain 10/18/2010  ? Routine general medical examination at a health care facility 10/06/2010  ? 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 Eczema 08/02/2006  ? ?Past Medical History:  ?Diagnosis Date  ? Anemia   ? Arthritis   ? Breast lump   ? left  ? Eczema   ? Family hx of colon cancer   ? Gallbladder problem   ? Hypertension   ? Kidney cysts   ? small, right 10/2002  ? Normal cardiac stress test 11/2008  ? exercise stress test normal but limited by exercise intolerance  ? Obesity   ? Shortness of breath   ? Sleep apnea   ? ?Past Surgical History:  ?Procedure Laterality Date  ? CHOLECYSTECTOMY  05/2002  ? [place]- gallstones, small renal cyst 05/2002  ? COLONOSCOPY WITH PROPOFOL N/A 07/15/2019  ? Procedure: COLONOSCOPY WITH PROPOFOL;  Surgeon: Yetta Flock, MD;  Location: [facility];  Service: Gastroenterology;  Laterality:  N/A;  ? FOOT SURGERY  11/1998  ? bunion  ? PARTIAL HYSTERECTOMY  1998  ? fibroids, ovaries remain  ? POLYPECTOMY  07/15/2019  ? Procedure: POLYPECTOMY;  Surgeon: Yetta Flock, MD;  Location: [facility];  Service: Gastroenterology;;  ? ?Social History  ? ?Tobacco Use  ? Smoking status: Former  ?  Packs/day: 0.25  ?  Years: 5.00  ?  Pack years: 1.25  ?  Types: Cigarettes  ?  Quit date: 03/20/1976  ?  Years since quitting: 45.3  ? Smokeless tobacco: Never  ?Substance Use Topics  ? Alcohol use: No  ?  Alcohol/week: 0.0 standard drinks  ? Drug use: No  ? ?Family History  ?Problem Relation Age of Onset  ? Colon cancer Mother   ? Stomach cancer Mother   ? Cancer Mother   ? Lung cancer Father   ?     smoker  ? Cancer Father   ? Hypertension Sister   ? Dementia Sister   ? Stroke Maternal Grandmother   ? Stroke  Paternal Grandmother   ? Hypertension Sister   ? Colon polyps Daughter   ? Heart attack Maternal Grandfather   ? ?Allergies  ?Allergen Reactions  ? Calcium Carbonate   ?  REACTION: constipation  ? Penicillins Rash  ?  Has patient had a PCN reaction causing immediate rash, facial/tongue/throat swelling, SOB or lightheadedness with hypotension: no ?Has patient had a PCN reaction causing severe rash involving mucus membranes or skin necrosis: no ?Has patient had a PCN reaction that required hospitalization: no ?Has patient had a PCN reaction occurring within the last 10 years: no ?If all of the above answers are "NO", then may proceed with Cephalosporin use. ?  ? ?Current Outpatient Medications on File Prior to Visit  ?Medication Sig Dispense Refill  ? triamcinolone cream (KENALOG) 0.1 % Apply 1 application topically 2 (two) times daily. Small amount to affected area (Patient not taking: Reported on 06/28/2021) 15 g 0  ? ?No current facility-administered medications on file prior to visit.  ?  ? ?Review of Systems  ?Constitutional:  Positive for fatigue. Negative for activity change, appetite change, fever and unexpected weight change.  ?HENT:  Negative for congestion, ear pain, rhinorrhea, sinus pressure and sore throat.   ?Eyes:  Negative for pain, redness and visual disturbance.  ?Respiratory:  Negative for cough, shortness of breath and wheezing.   ?Cardiovascular:  Negative for chest pain and palpitations.  ?Gastrointestinal:  Negative for abdominal pain, blood in stool, constipation and diarrhea.  ?Endocrine: Negative for polydipsia and polyuria.  ?Genitourinary:  Negative for dysuria, frequency and urgency.  ?Musculoskeletal:  Positive for arthralgias, back pain and gait problem. Negative for joint swelling and myalgias.  ?Skin:  Negative for pallor and rash.  ?Allergic/Immunologic: Negative for environmental allergies.  ?Neurological:  Negative for dizziness, syncope and headaches.  ?Hematological:  Negative for  adenopathy. Does not bruise/bleed easily.  ?Psychiatric/Behavioral:  Negative for decreased concentration and dysphoric mood. The patient is not nervous/anxious.   ? ?   ?Objective:  ? Physical Exam ?Constitutional:   ?   General: She is not in acute distress. ?   Appearance: Normal appearance. She is obese. She is not ill-appearing or diaphoretic.  ?Eyes:  ?   Conjunctiva/sclera: Conjunctivae normal.  ?   Pupils: Pupils are equal, round, and reactive to light.  ?Neck:  ?   Vascular: No carotid bruit.  ?Cardiovascular:  ?   Rate and Rhythm: Normal rate and regular  rhythm.  ?   Heart sounds: Normal heart sounds.  ?Pulmonary:  ?   Effort: Pulmonary effort is normal. No respiratory distress.  ?   Breath sounds: Normal breath sounds. No wheezing.  ?Musculoskeletal:     ?   General: Tenderness present.  ?   Cervical back: No tenderness.  ?   Comments: Poor rom of knees ?Poor rom of LS  ? ?Requires help getting up from chair w/o arms  ? ?Gait is slow and labored  ?  ?Lymphadenopathy:  ?   Cervical: No cervical adenopathy.  ?Skin: ?   Findings: No erythema or rash.  ?Neurological:  ?   Mental Status: She is alert.  ?   Sensory: No sensory deficit.  ?   Coordination: Coordination normal.  ?Psychiatric:     ?   Mood and Affect: Mood normal.  ? ? ? ? ? ?   ?Assessment & Plan:  ? ?Problem List Items Addressed This Visit   ? ?  ? Musculoskeletal and Integument  ? Unilateral primary osteoarthritis, left knee  ?  Unable to get knee repl due to obesity  ?Would benefit from a walker with seat so she can sit and take breaks  ? ? ? ?  ?  ? Relevant Orders  ? For home use only DME 4 wheeled rolling walker with seat ([id number])  ? For home use only DME Other see comment  ?  ? Other  ? Mobility impaired - Primary  ?  Lumbar disc dz and OA , knee OA and morbid obesity affect gait  ?Needs to stop and rest at least every 5 minutes ? ?Px for 4 wheeled walker and seat as well as shower chair done today  ? ? ?  ?  ? Relevant Orders  ? For home  use only DME 4 wheeled rolling walker with seat ([id number])  ? For home use only DME Other see comment  ? Morbid obesity with body mass index (BMI) of 50.0 to 59.9 in adult [facility])  ?  Discussed how this prob

## 2021-08-01 NOTE — Assessment & Plan Note (Signed)
Unable to get knee repl due to obesity  ?Would benefit from a walker with seat so she can sit and take breaks  ? ? ?

## 2021-08-01 NOTE — Patient Instructions (Addendum)
Voltaren gel over the counter is helpful (topically to joints that hurt)  ? ?Pedaling is a good choice for exercise  ?Eat a healthy balanced diet for weight loss  ? ?Use the walker as needed and the shower chair  ? ? ? ? ?

## 2021-08-01 NOTE — Assessment & Plan Note (Signed)
Discussed how this problem influences overall health and the risks it imposes  ?Reviewed plan for weight loss with lower calorie diet (via better food choices and also portion control or program like weight watchers) and exercise building up to or more than 30 minutes 5 days per week including some aerobic activity  ? ?This prevents her from having a knee replacement  ?Disc pedaling while seated as an exercise option for her knee OA ?Would be able to walk farther with walker  ?

## 2021-08-01 NOTE — Assessment & Plan Note (Signed)
Lumbar disc dz and OA , knee OA and morbid obesity affect gait  ?Needs to stop and rest at least every 5 minutes ? ?Px for 4 wheeled walker and seat as well as shower chair done today  ? ?

## 2021-10-11 ENCOUNTER — Ambulatory Visit: Payer: Medicare HMO | Admitting: Gastroenterology

## 2021-10-18 ENCOUNTER — Encounter: Payer: Self-pay | Admitting: Family Medicine

## 2021-10-18 ENCOUNTER — Ambulatory Visit (INDEPENDENT_AMBULATORY_CARE_PROVIDER_SITE_OTHER): Payer: Medicare HMO | Admitting: Family Medicine

## 2021-10-18 DIAGNOSIS — S20361A Insect bite (nonvenomous) of right front wall of thorax, initial encounter: Secondary | ICD-10-CM | POA: Diagnosis not present

## 2021-10-18 DIAGNOSIS — S20369A Insect bite (nonvenomous) of unspecified front wall of thorax, initial encounter: Secondary | ICD-10-CM | POA: Insufficient documentation

## 2021-10-18 DIAGNOSIS — W57XXXA Bitten or stung by nonvenomous insect and other nonvenomous arthropods, initial encounter: Secondary | ICD-10-CM

## 2021-10-18 NOTE — Progress Notes (Signed)
Subjective:    Patient ID: Barbara Kramer, female    DOB: 27-Jun-1950, 71 y.o.   MRN: [id number]  HPI Pt presents for c/o bruises on breast area /chest  Wt Readings from Last 3 Encounters:  10/18/21 288 lb 6.4 oz (130.8 kg)  08/01/21 293 lb 12.8 oz (133.3 kg)  06/28/21 292 lb 6 oz (132.6 kg)   53.61 kg/m   Was stung by a bee (thinks it was but did not see the insect- she felt it happen) 2-3 days later had red "bruises" on breast with a little knot under each one - this is lateral to the area Was quite itchy  Was red but now darker  Now a dark mark Not tender   No breast lumps   Last mammogram nl 04/2021  Patient Active Problem List   Diagnosis Date Noted   Insect bite of chest 10/18/2021   Mobility impaired 08/01/2021   Head pain 06/28/2021   Facial tingling 06/28/2021   Vulvar cyst 01/25/2021   Rash of neck 08/02/2020   Essential hypertension 06/30/2020   Medicare annual wellness visit, subsequent 05/14/2020   Unilateral primary osteoarthritis, left knee 05/10/2020   Morbid obesity with body mass index (BMI) of 50.0 to 59.9 in adult [facility]) 05/10/2020   Family history of colon cancer in mother    Benign neoplasm of colon    Welcome to Medicare preventive visit 01/29/2018   OSA (obstructive sleep apnea) 01/29/2018   Estrogen deficiency 01/29/2018   Allergic rhinitis 05/15/2017   Screening mammogram, encounter for 10/11/2016   Prediabetes 12/15/2014   Osteoarthritis of left knee 10/16/2013   Colon cancer screening 11/20/2012   Routine gynecological examination 11/17/2011   Other screening mammogram 11/17/2011   Low back pain 10/18/2010   Routine general medical examination at a health care facility 10/06/2010   ACANTHOSIS NIGRICANS 01/27/2008   Eczema 08/02/2006   Past Medical History:  Diagnosis Date   Anemia    Arthritis    Breast lump    left   Eczema    Family hx of colon cancer    Gallbladder problem    Hypertension    Kidney cysts    small,  right 10/2002   Normal cardiac stress test 11/2008   exercise stress test normal but limited by exercise intolerance   Obesity    Shortness of breath    Sleep apnea    Past Surgical History:  Procedure Laterality Date   CHOLECYSTECTOMY  05/2002   [place]- gallstones, small renal cyst 05/2002   COLONOSCOPY WITH PROPOFOL N/A 07/15/2019   Procedure: COLONOSCOPY WITH PROPOFOL;  Surgeon: Yetta Flock, MD;  Location: [facility];  Service: Gastroenterology;  Laterality: N/A;   FOOT SURGERY  11/1998   bunion   PARTIAL HYSTERECTOMY  1998   fibroids, ovaries remain   POLYPECTOMY  07/15/2019   Procedure: POLYPECTOMY;  Surgeon: Yetta Flock, MD;  Location: [facility];  Service: Gastroenterology;;   Social History   Tobacco Use   Smoking status: Former    Packs/day: 0.25    Years: 5.00    Total pack years: 1.25    Types: Cigarettes    Quit date: 03/20/1976    Years since quitting: 45.6   Smokeless tobacco: Never  Substance Use Topics   Alcohol use: No    Alcohol/week: 0.0 standard drinks of alcohol   Drug use: No   Family History  Problem Relation Age of Onset   Colon cancer Mother  59's   Stomach cancer Mother    Cancer Mother    Lung cancer Father        smoker   Cancer Father    Hypertension Sister    Dementia Sister    Hypertension Sister    Stroke Maternal Grandmother    Heart attack Maternal Grandfather    Stroke Paternal Grandmother    Colon polyps Daughter    Allergies  Allergen Reactions   Calcium Carbonate     REACTION: constipation   Penicillins Rash    Has patient had a PCN reaction causing immediate rash, facial/tongue/throat swelling, SOB or lightheadedness with hypotension: no Has patient had a PCN reaction causing severe rash involving mucus membranes or skin necrosis: no Has patient had a PCN reaction that required hospitalization: no Has patient had a PCN reaction occurring within the last 10 years: no If all of the above answers are  "NO", then may proceed with Cephalosporin use.    Current Outpatient Medications on File Prior to Visit  Medication Sig Dispense Refill   chlorthalidone (HYGROTON) 25 MG tablet Take 25 mg by mouth daily. Taking 0.'5mg'$  once a day as needed     triamcinolone cream (KENALOG) 0.1 % Apply 1 application topically 2 (two) times daily. Small amount to affected area 15 g 0   No current facility-administered medications on file prior to visit.     Review of Systems  Constitutional:  Negative for activity change, appetite change, fatigue, fever and unexpected weight change.  HENT:  Negative for congestion, ear pain, rhinorrhea, sinus pressure and sore throat.   Eyes:  Negative for pain, redness and visual disturbance.  Respiratory:  Negative for cough, shortness of breath and wheezing.   Cardiovascular:  Negative for chest pain and palpitations.  Gastrointestinal:  Negative for abdominal pain, blood in stool, constipation and diarrhea.  Endocrine: Negative for polydipsia and polyuria.  Genitourinary:  Negative for dysuria, frequency and urgency.  Musculoskeletal:  Negative for arthralgias, back pain and myalgias.  Skin:  Positive for wound. Negative for pallor and rash.  Allergic/Immunologic: Negative for environmental allergies.  Neurological:  Negative for dizziness, syncope and headaches.  Hematological:  Negative for adenopathy. Does not bruise/bleed easily.  Psychiatric/Behavioral:  Negative for decreased concentration and dysphoric mood. The patient is not nervous/anxious.        Objective:   Physical Exam Genitourinary:    Comments: Breast exam: No mass, nodules, thickening, tenderness, bulging, retraction, inflamation, nipple discharge , .  No axillary or clavicular LA.       Skin:    Coloration: Skin is not jaundiced or pale.     Findings: No rash.     Comments: Small pink papule mid chest/sternal area (where she was stung)  Non tender   2 small areas of hyperpigmentation just  lateral to this w/o swelling or tenderness     Psychiatric:        Mood and Affect: Mood normal.           Assessment & Plan:   Problem List Items Addressed This Visit       Musculoskeletal and Integument   Insect bite of chest    Possible sting/pt is unsure but had pain when it happened  She has papule with some redness on mid chest and 2 hyperpigmented areas lateral to it  Disc local care (clean/ abs oint if needed and avoid scratching) Breast exam is normal

## 2021-10-18 NOTE — Assessment & Plan Note (Signed)
Possible sting/pt is unsure but had pain when it happened  She has papule with some redness on mid chest and 2 hyperpigmented areas lateral to it  Disc local care (clean/ abs oint if needed and avoid scratching) Breast exam is normal

## 2021-10-18 NOTE — Patient Instructions (Addendum)
Keep the bite areas clean with soap and water  You can use an antibiotic ointment over the counter   If itchy-use a topical cortisone cream   Watch for increasing redness or any pain or swelling

## 2021-10-26 ENCOUNTER — Encounter (INDEPENDENT_AMBULATORY_CARE_PROVIDER_SITE_OTHER): Payer: Self-pay

## 2022-01-04 ENCOUNTER — Ambulatory Visit (INDEPENDENT_AMBULATORY_CARE_PROVIDER_SITE_OTHER): Payer: Medicare HMO | Admitting: Nurse Practitioner

## 2022-01-04 VITALS — BP 130/64 | HR 77 | Temp 96.5°F | Resp 16 | Wt 288.0 lb

## 2022-01-04 DIAGNOSIS — R202 Paresthesia of skin: Secondary | ICD-10-CM | POA: Insufficient documentation

## 2022-01-04 DIAGNOSIS — M5432 Sciatica, left side: Secondary | ICD-10-CM

## 2022-01-04 MED ORDER — PREDNISONE 20 MG PO TABS: ORAL_TABLET | ORAL | 0 refills | Status: AC

## 2022-01-04 NOTE — Assessment & Plan Note (Signed)
Per report and exam we will treat patient for left-sided sciatica.  Did give rehab exercises to use at home as tolerated.  We will do prednisone 20 mg taper as directed.  Prednisone precautions reviewed.  Follow-up if no improvement. patient does not have a history of osteopenia no injury we will defer x-ray lumbar spine today

## 2022-01-04 NOTE — Patient Instructions (Signed)
Nice to see you today I sent in the prescription to your pharmacy Follow up if you do not improve or get worse. If you get a rash to your back let us know

## 2022-01-04 NOTE — Assessment & Plan Note (Signed)
Tingling bilateral lower back about patient's panty line.  No rash appreciated.  Low suspicion for shingles.  Did caution patient to keep an eye for rash to erupt if so reach out to the office for follow-up.

## 2022-01-04 NOTE — Progress Notes (Signed)
Acute Office Visit  Subjective:     Patient ID: Barbara Kramer, female    DOB: [birth year], 71 y.o.   MRN: [id number]  Chief Complaint  Patient presents with   burning sensation of the back    X few days, burning sensation in the lower bilateral area of the back. No rash.   Back Pain    Has gotten worse in the last week or so. Has lower left back pain that radiates down the left leg.      Patient is in today for  multiple complaints   Hx of HTN, osa,low back pain, prediabetic with complaints of back pain   States that she is having lower left sided back pain that is going down the left leg. States that it is intermittent in nature and dsecribed as  throb pain. No injury. States that it has been going on for approx 1.5 weeks. States that certain positions will make it come on Happens sitting mostly.Has not tried anything otc.   Burning senstatoin: states that it feels like an oval. States that when she sitts it is below her  pantie line. She is concerned for shingle.s Has not not had the shingles vaccine.  Review of Systems  Constitutional:  Negative for chills and fever.  Genitourinary:        Negative B&B involvement   Musculoskeletal:  Positive for back pain.  Neurological:  Positive for weakness (nothing new). Negative for tingling.        Objective:    BP 130/64   Pulse 77   Temp (!) 96.5 F (35.8 C) (Temporal)   Resp 16   Wt 288 lb (130.6 kg)   SpO2 98%   BMI 53.54 kg/m    Physical Exam Vitals and nursing note reviewed.  Constitutional:      Appearance: Normal appearance. She is obese.  Cardiovascular:     Rate and Rhythm: Normal rate and regular rhythm.     Pulses:          Dorsalis pedis pulses are 2+ on the right side and 2+ on the left side.     Heart sounds: Normal heart sounds.  Pulmonary:     Effort: Pulmonary effort is normal.     Breath sounds: Normal breath sounds.  Musculoskeletal:     Lumbar back: No tenderness or bony tenderness.  Negative right straight leg raise test and negative left straight leg raise test.     Right lower leg: No edema.     Left lower leg: No edema.  Skin:    General: Skin is warm.     Findings: No rash.       Neurological:     General: No focal deficit present.     Mental Status: She is alert.     Comments: Bilateral lower extremity strength 5/5     No results found for any visits on 01/04/22.      Assessment & Plan:   Problem List Items Addressed This Visit       Nervous and Auditory   Sciatica of left side - Primary    Per report and exam we will treat patient for left-sided sciatica.  Did give rehab exercises to use at home as tolerated.  We will do prednisone 20 mg taper as directed.  Prednisone precautions reviewed.  Follow-up if no improvement. patient does not have a history of osteopenia no injury we will defer x-ray lumbar spine today  Relevant Medications   predniSONE (DELTASONE) 20 MG tablet     Other   Paresthesia    Tingling bilateral lower back about patient's panty line.  No rash appreciated.  Low suspicion for shingles.  Did caution patient to keep an eye for rash to erupt if so reach out to the office for follow-up.      Relevant Medications   predniSONE (DELTASONE) 20 MG tablet    Meds ordered this encounter  Medications   predniSONE (DELTASONE) 20 MG tablet    Sig: Take 1 tablet (20 mg total) by mouth 2 (two) times daily with a meal for 3 days, THEN 1 tablet (20 mg total) daily with breakfast for 3 days. Do NOT take with NSAID like: ibuprofen, aleve, naproxen, motrin, BC/Goody powders.    Dispense:  9 tablet    Refill:  0    Order Specific Question:   Supervising Provider    Answer:   TOWER, MARNE A [[id number]    Return if symptoms worsen or fail to improve.  Romilda Garret, NP

## 2022-02-28 DIAGNOSIS — H524 Presbyopia: Secondary | ICD-10-CM | POA: Diagnosis not present

## 2022-02-28 DIAGNOSIS — H5212 Myopia, left eye: Secondary | ICD-10-CM | POA: Diagnosis not present

## 2022-02-28 DIAGNOSIS — H5201 Hypermetropia, right eye: Secondary | ICD-10-CM | POA: Diagnosis not present

## 2022-02-28 DIAGNOSIS — H25013 Cortical age-related cataract, bilateral: Secondary | ICD-10-CM | POA: Diagnosis not present

## 2022-02-28 DIAGNOSIS — H2513 Age-related nuclear cataract, bilateral: Secondary | ICD-10-CM | POA: Diagnosis not present

## 2022-02-28 DIAGNOSIS — H52213 Irregular astigmatism, bilateral: Secondary | ICD-10-CM | POA: Diagnosis not present

## 2022-03-14 DIAGNOSIS — H524 Presbyopia: Secondary | ICD-10-CM | POA: Diagnosis not present

## 2022-04-24 ENCOUNTER — Other Ambulatory Visit: Payer: Self-pay

## 2022-04-24 ENCOUNTER — Emergency Department (HOSPITAL_BASED_OUTPATIENT_CLINIC_OR_DEPARTMENT_OTHER): Payer: Medicare HMO

## 2022-04-24 ENCOUNTER — Observation Stay (HOSPITAL_BASED_OUTPATIENT_CLINIC_OR_DEPARTMENT_OTHER)
Admission: EM | Admit: 2022-04-24 | Discharge: 2022-04-26 | Disposition: A | Discharge status: Home / Self Care | Payer: Medicare HMO | Attending: Internal Medicine | Admitting: Internal Medicine

## 2022-04-24 ENCOUNTER — Encounter (HOSPITAL_BASED_OUTPATIENT_CLINIC_OR_DEPARTMENT_OTHER): Payer: Self-pay | Admitting: Radiology

## 2022-04-24 DIAGNOSIS — I6521 Occlusion and stenosis of right carotid artery: Principal | ICD-10-CM | POA: Insufficient documentation

## 2022-04-24 DIAGNOSIS — R519 Headache, unspecified: Secondary | ICD-10-CM | POA: Diagnosis not present

## 2022-04-24 DIAGNOSIS — G459 Transient cerebral ischemic attack, unspecified: Secondary | ICD-10-CM | POA: Diagnosis not present

## 2022-04-24 DIAGNOSIS — R29810 Facial weakness: Secondary | ICD-10-CM | POA: Diagnosis present

## 2022-04-24 DIAGNOSIS — I10 Essential (primary) hypertension: Secondary | ICD-10-CM | POA: Diagnosis present

## 2022-04-24 DIAGNOSIS — I6529 Occlusion and stenosis of unspecified carotid artery: Secondary | ICD-10-CM | POA: Insufficient documentation

## 2022-04-24 DIAGNOSIS — Z87891 Personal history of nicotine dependence: Secondary | ICD-10-CM | POA: Insufficient documentation

## 2022-04-24 DIAGNOSIS — Z8673 Personal history of transient ischemic attack (TIA), and cerebral infarction without residual deficits: Secondary | ICD-10-CM | POA: Diagnosis present

## 2022-04-24 DIAGNOSIS — Z79899 Other long term (current) drug therapy: Secondary | ICD-10-CM | POA: Diagnosis not present

## 2022-04-24 LAB — DIFFERENTIAL
Abs Immature Granulocytes: 0.02 10*3/uL (ref 0.00–0.07)
Basophils Absolute: 0 10*3/uL (ref 0.0–0.1)
Basophils Relative: 0 %
Eosinophils Absolute: 0.2 10*3/uL (ref 0.0–0.5)
Eosinophils Relative: 2 %
Immature Granulocytes: 0 %
Lymphocytes Relative: 30 %
Lymphs Abs: 2.9 10*3/uL (ref 0.7–4.0)
Monocytes Absolute: 0.4 10*3/uL (ref 0.1–1.0)
Monocytes Relative: 5 %
Neutro Abs: 6 10*3/uL (ref 1.7–7.7)
Neutrophils Relative %: 63 %

## 2022-04-24 LAB — COMPREHENSIVE METABOLIC PANEL
ALT: 11 U/L (ref 0–44)
AST: 14 U/L — ABNORMAL LOW (ref 15–41)
Albumin: 4.4 g/dL (ref 3.5–5.0)
Alkaline Phosphatase: 55 U/L (ref 38–126)
Anion gap: 7 (ref 5–15)
BUN: 16 mg/dL (ref 8–23)
CO2: 31 mmol/L (ref 22–32)
Calcium: 10.4 mg/dL — ABNORMAL HIGH (ref 8.9–10.3)
Chloride: 99 mmol/L (ref 98–111)
Creatinine, Ser: 1.11 mg/dL — ABNORMAL HIGH (ref 0.44–1.00)
GFR, Estimated: 54 mL/min — ABNORMAL LOW (ref >60)
Glucose, Bld: 119 mg/dL — ABNORMAL HIGH (ref 70–99)
Potassium: 3.8 mmol/L (ref 3.5–5.1)
Sodium: 137 mmol/L (ref 135–145)
Total Bilirubin: 0.3 mg/dL (ref 0.3–1.2)
Total Protein: 8.1 g/dL (ref 6.5–8.1)

## 2022-04-24 LAB — PROTIME-INR
INR: 1 (ref 0.8–1.2)
Prothrombin Time: 13.3 seconds (ref 11.4–15.2)

## 2022-04-24 LAB — CBC
HCT: 42.9 % (ref 36.0–46.0)
Hemoglobin: 13.9 g/dL (ref 12.0–15.0)
MCH: 27.9 pg (ref 26.0–34.0)
MCHC: 32.4 g/dL (ref 30.0–36.0)
MCV: 86 fL (ref 80.0–100.0)
Platelets: 273 10*3/uL (ref 150–400)
RBC: 4.99 MIL/uL (ref 3.87–5.11)
RDW: 14.3 % (ref 11.5–15.5)
WBC: 9.5 10*3/uL (ref 4.0–10.5)
nRBC: 0 % (ref 0.0–0.2)

## 2022-04-24 LAB — APTT: aPTT: 29 seconds (ref 24–36)

## 2022-04-24 LAB — ETHANOL: Alcohol, Ethyl (B): <10 mg/dL (ref <10)

## 2022-04-24 LAB — CBG MONITORING, ED: Glucose-Capillary: 125 mg/dL — ABNORMAL HIGH (ref 70–99)

## 2022-04-24 MED ORDER — ASPIRIN 325 MG PO TABS: 325.0000 mg | ORAL_TABLET | Freq: Every day | ORAL | Status: DC

## 2022-04-24 MED ORDER — SODIUM CHLORIDE 0.9% FLUSH
3.0000 mL | Freq: Once | INTRAVENOUS | Status: AC
  Administered 2022-04-25: 3 mL via INTRAVENOUS

## 2022-04-24 MED ORDER — IOHEXOL 350 MG/ML SOLN
100.0000 mL | Freq: Once | INTRAVENOUS | Status: AC | PRN
  Administered 2022-04-24: 75 mL via INTRAVENOUS

## 2022-04-24 MED ORDER — CLOPIDOGREL BISULFATE 300 MG PO TABS
300.0000 mg | ORAL_TABLET | Freq: Once | ORAL | Status: AC
  Administered 2022-04-24: 300 mg via ORAL
  Filled 2022-04-24: qty 1

## 2022-04-24 NOTE — ED Provider Notes (Incomplete)
[facility] Provider Note   CSN: [id number] Arrival date & time: 04/24/22  1813     History  No chief complaint on file.   Barbara Kramer is a 72 y.o. female.  HPI   72 year old female with medical history significant for obesity, OSA, hypertension who presents to the emergency department with concern for stroke.  The patient states that around 5:13 PM she was sitting at a restaurant when she noticed that she was having difficulty chewing her food.  She looked in the mirror and noticed that her face was drooping.  She called EMS and her initial blood pressure was found to be hypertensive at 218/110.  She states that her symptoms lasted no more than 10 minutes.  She has chronically intermittent left upper extremity numbness and few has felt intermittently today.  She endorses headaches which has been ongoing for the past few days but denies any current headache or vision changes.  She denies any weakness in her upper extremities or lower extremities.  She denies any difficulty swallowing.  She denies any recurrence of her facial droop which has since resolved.  Home Medications Prior to Admission medications   Medication Sig Start Date End Date Taking? Authorizing Provider  chlorthalidone (HYGROTON) 25 MG tablet Take 25 mg by mouth daily. Taking 0.'5mg'$  once a day as needed    [provider]  triamcinolone cream (KENALOG) 0.1 % Apply 1 application topically 2 (two) times daily. Small amount to affected area 10/28/20   Tower, Wynelle Fanny, MD      Allergies    Calcium carbonate and Penicillins    Review of Systems   Review of Systems  Neurological:  Positive for facial asymmetry and numbness.  All other systems reviewed and are negative.   Physical Exam Updated Vital Signs BP (!) 170/80 (BP Location: Right Arm)   Pulse 81   Temp 98.5 F (36.9 C) (Oral)   Resp 20   Ht '5\' 2"'$  (1.575 m)   Wt 130.6 kg   SpO2 100%   BMI 52.68 kg/m   Physical Exam Vitals and nursing note reviewed.  Constitutional:      General: She is not in acute distress.    Appearance: She is well-developed.  HENT:     Head: Normocephalic and atraumatic.  Eyes:     Conjunctiva/sclera: Conjunctivae normal.  Cardiovascular:     Rate and Rhythm: Normal rate and regular rhythm.  Pulmonary:     Effort: Pulmonary effort is normal. No respiratory distress.     Breath sounds: Normal breath sounds.  Abdominal:     Palpations: Abdomen is soft.     Tenderness: There is no abdominal tenderness.  Musculoskeletal:        General: No swelling.     Cervical back: Neck supple.  Skin:    General: Skin is warm and dry.     Capillary Refill: Capillary refill takes less than 2 seconds.  Neurological:     Mental Status: She is alert.     Comments: MENTAL STATUS EXAM:    Orientation: Alert and oriented to person, place and time.  Memory: Cooperative, follows commands well.  Language: Speech is clear and language is normal.   CRANIAL NERVES:    CN 2 (Optic): Visual fields intact to confrontation.  CN 3,4,6 (EOM): Pupils equal and reactive to light. Full extraocular eye movement without nystagmus.  CN 5 (Trigeminal): Facial sensation is normal, no weakness of masticatory muscles.  CN 7 (Facial): No facial weakness or asymmetry.  CN 8 (Auditory): Auditory acuity grossly normal.  CN 9,10 (Glossophar): The uvula is midline, the palate elevates symmetrically.  CN 11 (spinal access): Normal sternocleidomastoid and trapezius strength.  CN 12 (Hypoglossal): The tongue is midline. No atrophy or fasciculations.Marland Kitchen   MOTOR:  Muscle Strength: 5/5RUE, 5/5LUE, 5/5RLE, 5/5LLE.   COORDINATION:   Intact finger-to-nose, no tremor.   SENSATION:   Intact to light touch all four extremities, slightly diminished sensation on the left forearm.    Psychiatric:        Mood and Affect: Mood normal.     ED Results / Procedures / Treatments   Labs (all labs ordered are  listed, but only abnormal results are displayed) Labs Reviewed  COMPREHENSIVE METABOLIC PANEL - Abnormal; Notable for the following components:      Result Value   Glucose, Bld 119 (*)    Creatinine, Ser 1.11 (*)    Calcium 10.4 (*)    AST 14 (*)    GFR, Estimated 54 (*)    All other components within normal limits  PROTIME-INR  APTT  CBC  DIFFERENTIAL  ETHANOL  CBG MONITORING, ED    EKG None  Radiology CT HEAD WO CONTRAST  Result Date: 04/24/2022 CLINICAL DATA:  Headaches, twisting of the mouth EXAM: CT HEAD WITHOUT CONTRAST TECHNIQUE: Contiguous axial images were obtained from the base of the skull through the vertex without intravenous contrast. RADIATION DOSE REDUCTION: This exam was performed according to the departmental dose-optimization program which includes automated exposure control, adjustment of the mA and/or kV according to patient size and/or use of iterative reconstruction technique. COMPARISON:  None Available. FINDINGS: Brain: No acute territorial infarction, hemorrhage or intracranial mass. Mild white matter hypodensity. The ventricles are nonenlarged. Vascular: No hyperdense vessels.  Carotid vascular calcification Skull: Normal. Negative for fracture or focal lesion. Sinuses/Orbits: No acute finding. Other: None IMPRESSION: 1. No CT evidence for acute intracranial abnormality. 2. Mild small vessel ischemic changes of the white matter. Electronically Signed   By: Donavan Foil M.D.   On: 04/24/2022 19:24    Procedures Procedures    Medications Ordered in ED Medications  sodium chloride flush (NS) 0.9 % injection 3 mL (has no administration in time range)    ED Course/ Medical Decision Making/ A&P   {   Click here for ABCD2, HEART and other calculatorsREFRESH Note before signing :1}                          Medical Decision Making Amount and/or Complexity of Data Reviewed Labs: ordered. Radiology: ordered.  Risk OTC drugs. Prescription drug  management. Decision regarding hospitalization.   ***  {Document critical care time when appropriate:1} {Document review of labs and clinical decision tools ie heart score, Chads2Vasc2 etc:1}  {Document your independent review of radiology images, and any outside records:1} {Document your discussion with family members, caretakers, and with consultants:1} {Document social determinants of health affecting pt's care:1} {Document your decision making why or why not admission, treatments were needed:1} Final Clinical Impression(s) / ED Diagnoses Final diagnoses:  None    Rx / DC Orders ED Discharge Orders     None

## 2022-04-24 NOTE — Plan of Care (Signed)
   Patient Name: Barbara Kramer, Barbara Kramer DOB: [birth year] MRN: [id number] Transferring facility: [facility] Requesting provider: lawsing, MD Reason for transfer: TIA 72 yo AAF with acute left sided facial droop lasted less than 10 mins. hx of HTN, OSA, obesity. CT head negative. asymptomatic now. EDP discussed with tele-neurology. recommended admission. requested EDP to get CT head/neck Going to: [facility] Admission Status: observation Bed Type: med/tele To Do: consult neurology on arrival to [facility]  [facility] will assume care on arrival to accepting facility. Until arrival, medical decision making responsibilities remain with the EDP.  However, TRH available 24/7 for questions and assistance.   Nursing staff please page [place] and Consults [phone]) as soon as the patient arrives to the hospital.  Kristopher Oppenheim, DO [facility]

## 2022-04-24 NOTE — ED Triage Notes (Signed)
Pt states at 5:13pm pt was sitting at a restaurant and noticed she was biting her lip. She looked in the mirror and noticed her mouth "twisting" per the patient. Called 911 and her BP was 218/110. Pt didn't want to go to [facility] so her son brought her here. Pt has had headaches off and on for a few days but thought it was due to her hair rollers she left in. Pt without facial droop at this time. NIHSS 0. Of note pt sis have a shingles shot today.

## 2022-04-24 NOTE — Consult Note (Addendum)
[facility] TeleNeurology Consult Services  Stat Consult  Patient Name:   Barbara Kramer, Barbara Kramer Date of Birth:   07/18/1950 Identification Number:   MRN - [id number] Date of Service:   04/24/2022 22:32:28  Diagnosis:       G45.9 - Transient cerebral ischemic attack, unspecified  Impression transient episode of acute onset left facial droop 2/2 TIA; etiology unclear at this time, work up pending; patient is not a candidate for IV thrombolytics given return to baseline with no residual symptoms.  Plan: load with aspirin 325 mg x1 and plavix 300 mg x1. Allow permissive HTN. Recommend admission for further workup. Will need an MRI brain and MRA head/neck when able, echo, tele monitoring, labs for risk factor stratification (lipid panel, HbA1c, TSH with free T4), PT/OT/speech eval.  Plan discussed with patient/family, ED team, including ED physician, who are all in agreement with plan. All questions answered to the best of my ability.   Recommendations: Our recommendations are outlined above.  Disposition : Neurology will follow   ----------------------------------------------------------------------------------------------------   Advanced Imaging: Advanced Imaging Deferred because:  plan for MRI brain and MRA head/neck    Metrics: [facility] Notification Time: 04/24/2022 22:29:55 Stamp Time: 04/24/2022 22:32:28 Callback Response Time: 04/24/2022 22:29:58  Primary Provider Notified of Diagnostic Impression and Management Plan on: 04/24/2022 22:46:24   CT HEAD: Reviewed Imaging was personally reviewed. CTH is negative for bleed or early ischemic changes.    ----------------------------------------------------------------------------------------------------  Chief Complaint: left facial droop  History of Present Illness: Patient is a 72 year old Female. 72 yo woman with hypertension, prediabetes, sleep apnea, who presents with transient episode  of left facial droop. LKW 1700. Patient is accompanied by son who is present at bedside. The episode of left facial droop lasted less than 10 mins. No speech changes or vision changes. Apart from some tingling on the middle finger on the left hand, unclear if this is chronic as she has been having intermittent tingling in two of her digits on the left hand in the past, no other focal numbness/tingling. No focal weakness. No difficulties in the lower extremities. No gait instability.  At the time of my evaluation, she is back to baseline, confirmed by patient/family.    Past Medical History:      Hypertension Other PMH: sleep apnea prediabetes ACANTHOSIS NIGRICANS  Medications:  No Anticoagulant use  No Antiplatelet use Reviewed EMR for current medications Other Medications Pertinent To Assessment Include: chlorthalidone  Allergies:  Reviewed  Social History: Smoking: Former  Family History:  There Is Family History Of: sister- dementia  mother- colon cancer  father- lung cancer (tobacco use) There is no family history of premature cerebrovascular disease pertinent to this consultation  ROS : 14 Points Review of Systems was performed and was negative except mentioned in HPI.  Past Surgical History: There Is No Surgical History Contributory To Today's Visit    Examination: BP(170/80), Pulse(81), Blood Glucose(119) 1A: Level of Consciousness - Alert; keenly responsive + 0 1B: Ask Month and Age - Both Questions Right + 0 1C: Blink Eyes & Squeeze Hands - Performs Both Tasks + 0 2: Test Horizontal Extraocular Movements - Normal + 0 3: Test Visual Fields - No Visual Loss + 0 4: Test Facial Palsy (Use Grimace if Obtunded) - Normal symmetry + 0 5A: Test Left Arm Motor Drift - No Drift for 10 Seconds + 0 5B: Test Right Arm Motor Drift - No Drift for 10 Seconds + 0 6A: Test Left Leg Motor Drift -  No Drift for 5 Seconds + 0 6B: Test Right Leg Motor Drift - No Drift for 5 Seconds  + 0 7: Test Limb Ataxia (FNF/Heel-Shin) - No Ataxia + 0 8: Test Sensation - Normal; No sensory loss + 0 9: Test Language/Aphasia - Normal; No aphasia + 0 10: Test Dysarthria - Normal + 0 11: Test Extinction/Inattention - No abnormality + 0  NIHSS Score: 0  Spoke with : Dr. Armandina Gemma    Patient / Family was informed the Neurology Consult would occur via TeleHealth consult by way of interactive audio and video telecommunications and consented to receiving care in this manner.  Patient is being evaluated for possible acute neurologic impairment and high probability of imminent or life - threatening deterioration.I spent total of 36 minutes providing care to this patient, including time for face to face visit via telemedicine, review of medical records, imaging studies and discussion of findings with providers, the patient and / or family.   Dr Perrin Smack   [facility] For Inpatient follow-up with [facility] physician please call [facility] [phone]. This is not an outpatient service. Post hospital discharge, please contact hospital directly.  Please do not communicate with [facility] physicians via secure chat. If you have any questions, Please contact [facility]. Please call or reconsult our service if there are any clinical or diagnostic changes.

## 2022-04-24 NOTE — ED Notes (Signed)
Patient transported to CT 

## 2022-04-25 ENCOUNTER — Encounter (HOSPITAL_BASED_OUTPATIENT_CLINIC_OR_DEPARTMENT_OTHER): Payer: Self-pay | Admitting: Internal Medicine

## 2022-04-25 ENCOUNTER — Observation Stay (HOSPITAL_COMMUNITY): Payer: Medicare HMO

## 2022-04-25 DIAGNOSIS — G459 Transient cerebral ischemic attack, unspecified: Secondary | ICD-10-CM

## 2022-04-25 DIAGNOSIS — R519 Headache, unspecified: Secondary | ICD-10-CM | POA: Diagnosis not present

## 2022-04-25 DIAGNOSIS — I6521 Occlusion and stenosis of right carotid artery: Secondary | ICD-10-CM | POA: Diagnosis not present

## 2022-04-25 DIAGNOSIS — I6529 Occlusion and stenosis of unspecified carotid artery: Secondary | ICD-10-CM | POA: Insufficient documentation

## 2022-04-25 DIAGNOSIS — Z87891 Personal history of nicotine dependence: Secondary | ICD-10-CM | POA: Diagnosis not present

## 2022-04-25 DIAGNOSIS — R29810 Facial weakness: Secondary | ICD-10-CM | POA: Diagnosis not present

## 2022-04-25 DIAGNOSIS — I10 Essential (primary) hypertension: Secondary | ICD-10-CM

## 2022-04-25 DIAGNOSIS — Z79899 Other long term (current) drug therapy: Secondary | ICD-10-CM | POA: Diagnosis not present

## 2022-04-25 DIAGNOSIS — I639 Cerebral infarction, unspecified: Secondary | ICD-10-CM

## 2022-04-25 LAB — LIPID PANEL
Cholesterol: 137 mg/dL (ref 0–200)
HDL: 47 mg/dL (ref >40)
LDL Cholesterol: 80 mg/dL (ref 0–99)
Total CHOL/HDL Ratio: 2.9 RATIO
Triglycerides: 52 mg/dL (ref <150)
VLDL: 10 mg/dL (ref 0–40)

## 2022-04-25 LAB — BASIC METABOLIC PANEL
Anion gap: 8 (ref 5–15)
BUN: 11 mg/dL (ref 8–23)
CO2: 25 mmol/L (ref 22–32)
Calcium: 9.2 mg/dL (ref 8.9–10.3)
Chloride: 103 mmol/L (ref 98–111)
Creatinine, Ser: 0.92 mg/dL (ref 0.44–1.00)
GFR, Estimated: >60 mL/min (ref >60)
Glucose, Bld: 90 mg/dL (ref 70–99)
Potassium: 3.6 mmol/L (ref 3.5–5.1)
Sodium: 136 mmol/L (ref 135–145)

## 2022-04-25 LAB — CBC
HCT: 38.5 % (ref 36.0–46.0)
Hemoglobin: 12.5 g/dL (ref 12.0–15.0)
MCH: 27.9 pg (ref 26.0–34.0)
MCHC: 32.5 g/dL (ref 30.0–36.0)
MCV: 85.9 fL (ref 80.0–100.0)
Platelets: 237 10*3/uL (ref 150–400)
RBC: 4.48 MIL/uL (ref 3.87–5.11)
RDW: 13.9 % (ref 11.5–15.5)
WBC: 8.7 10*3/uL (ref 4.0–10.5)
nRBC: 0 % (ref 0.0–0.2)

## 2022-04-25 LAB — HIV ANTIBODY (ROUTINE TESTING W REFLEX): HIV Screen 4th Generation wRfx: NONREACTIVE

## 2022-04-25 LAB — HEMOGLOBIN A1C
Hgb A1c MFr Bld: 4.7 % — ABNORMAL LOW (ref 4.8–5.6)
Mean Plasma Glucose: 88 mg/dL

## 2022-04-25 MED ORDER — ONDANSETRON HCL 4 MG/2ML IJ SOLN: 4.0000 mg | Freq: Four times a day (QID) | INTRAMUSCULAR | Status: DC | PRN

## 2022-04-25 MED ORDER — STROKE: EARLY STAGES OF RECOVERY BOOK
Freq: Once | Status: AC
  Filled 2022-04-25: qty 1

## 2022-04-25 MED ORDER — ACETAMINOPHEN 650 MG RE SUPP: 650.0000 mg | Freq: Four times a day (QID) | RECTAL | Status: DC | PRN

## 2022-04-25 MED ORDER — ATORVASTATIN CALCIUM 10 MG PO TABS
20.0000 mg | ORAL_TABLET | Freq: Every day | ORAL | Status: DC
  Administered 2022-04-25 – 2022-04-26 (×2): 20 mg via ORAL
  Filled 2022-04-25 (×2): qty 2

## 2022-04-25 MED ORDER — ACETAMINOPHEN 325 MG PO TABS: 650.0000 mg | ORAL_TABLET | Freq: Four times a day (QID) | ORAL | Status: DC | PRN

## 2022-04-25 MED ORDER — MAGNESIUM HYDROXIDE 400 MG/5ML PO SUSP: 30.0000 mL | Freq: Every day | ORAL | Status: DC | PRN

## 2022-04-25 MED ORDER — SODIUM CHLORIDE 0.9 % IV SOLN: INTRAVENOUS | Status: DC

## 2022-04-25 MED ORDER — TRAZODONE HCL 50 MG PO TABS: 25.0000 mg | ORAL_TABLET | Freq: Every evening | ORAL | Status: DC | PRN

## 2022-04-25 MED ORDER — ENOXAPARIN SODIUM 40 MG/0.4ML IJ SOSY
40.0000 mg | PREFILLED_SYRINGE | INTRAMUSCULAR | Status: DC
  Administered 2022-04-25 – 2022-04-26 (×2): 40 mg via SUBCUTANEOUS
  Filled 2022-04-25 (×2): qty 0.4

## 2022-04-25 MED ORDER — ASPIRIN 81 MG PO TBEC
81.0000 mg | DELAYED_RELEASE_TABLET | Freq: Every day | ORAL | Status: DC
  Administered 2022-04-25 – 2022-04-26 (×2): 81 mg via ORAL
  Filled 2022-04-25 (×2): qty 1

## 2022-04-25 MED ORDER — CLOPIDOGREL BISULFATE 75 MG PO TABS
75.0000 mg | ORAL_TABLET | Freq: Every day | ORAL | Status: DC
  Administered 2022-04-25 – 2022-04-26 (×2): 75 mg via ORAL
  Filled 2022-04-25 (×2): qty 1

## 2022-04-25 MED ORDER — ONDANSETRON HCL 4 MG PO TABS: 4.0000 mg | ORAL_TABLET | Freq: Four times a day (QID) | ORAL | Status: DC | PRN

## 2022-04-25 NOTE — Evaluation (Signed)
Physical Therapy Evaluation and Discharge Patient Details Name: Barbara Kramer MRN: [id number] DOB: 05/23/1950 Today's Date: 04/25/2022  History of Present Illness  72 y.o. female who presented to the emergency room 04/24/22 with left facial droop that lasted less than 10 minutes. MRI Acute Right MCA territory ischemia. PMH significant for essential hypertension, obstructive sleep apnea who had stopped CPAP, and osteoarthritis  Clinical Impression   Patient evaluated by Physical Therapy with no further acute PT needs identified. Patient walking modified independent in hall. Scored 51/56 on Berg Balance assessment.  PT is signing off. Thank you for this referral.        Recommendations for follow up therapy are one component of a multi-disciplinary discharge planning process, led by the attending physician.  Recommendations may be updated based on patient status, additional functional criteria and insurance authorization.  Follow Up Recommendations No PT follow up      Assistance Recommended at Discharge None  Patient can return home with the following       Equipment Recommendations None recommended by PT  Recommendations for Other Services       Functional Status Assessment Patient has not had a recent decline in their functional status     Precautions / Restrictions Precautions Precautions: None Restrictions Weight Bearing Restrictions: No      Mobility  Bed Mobility               General bed mobility comments: sitting at EOB    Transfers Overall transfer level: Modified independent Equipment used: None                    Ambulation/Gait Ambulation/Gait assistance: Modified independent (Device/Increase time) Gait Distance (Feet): 180 Feet Assistive device: IV Pole Gait Pattern/deviations: WFL(Within Functional Limits), Wide base of support   Gait velocity interpretation: >2.62 ft/sec, indicative of community ambulatory   General Gait Details:  pt reported she felt more comfortable holding onto IV pole (did not want to release, but denied feeling off-balance)  Stairs            Wheelchair Mobility    Modified Rankin (Stroke Patients Only) Modified Rankin (Stroke Patients Only) Pre-Morbid Rankin Score: No symptoms Modified Rankin: No symptoms     Balance Overall balance assessment: Modified Independent                               Standardized Balance Assessment Standardized Balance Assessment : Berg Balance Test Berg Balance Test Sit to Stand: Able to stand without using hands and stabilize independently Standing Unsupported: Able to stand safely 2 minutes Sitting with Back Unsupported but Feet Supported on Floor or Stool: Able to sit safely and securely 2 minutes Stand to Sit: Sits safely with minimal use of hands Transfers: Able to transfer safely, minor use of hands Standing Unsupported with Eyes Closed: Able to stand 10 seconds safely Standing Ubsupported with Feet Together: Able to place feet together independently and stand 1 minute safely From Standing, Reach Forward with Outstretched Arm: Can reach confidently >25 cm (10") From Standing Position, Pick up Object from Floor: Able to pick up shoe, needs supervision From Standing Position, Turn to Look Behind Over each Shoulder: Looks behind from both sides and weight shifts well Turn 360 Degrees: Able to turn 360 degrees safely in 4 seconds or less Standing Unsupported, Alternately Place Feet on Step/Stool: Able to stand independently and complete 8 steps >20 seconds Standing Unsupported, One  Foot in Front: Able to plae foot ahead of the other independently and hold 30 seconds Standing on One Leg: Able to lift leg independently and hold equal to or more than 3 seconds Total Score: 51         Pertinent Vitals/Pain Pain Assessment Pain Assessment: No/denies pain    Home Living Family/patient expects to be discharged to:: Private  residence Living Arrangements: Children (son) Available Help at Discharge: Family;Available PRN/intermittently Type of Home: House Home Access: Stairs to enter Entrance Stairs-Rails: Can reach both Entrance Stairs-Number of Steps: 3-4   Home Layout: One level Home Equipment: Shower seat;Grab bars - tub/shower;Rolling Walker (2 wheels);Rollator (4 wheels);Adaptive equipment      Prior Function Prior Level of Function : Independent/Modified Independent;Driving;Working/employed             Mobility Comments: has rollator and uses at times in community if knee arthritis/pain increased ADLs Comments: Mudlogger Dominance   Dominant Hand: Right    Extremity/Trunk Assessment   Upper Extremity Assessment Upper Extremity Assessment: Defer to OT evaluation    Lower Extremity Assessment Lower Extremity Assessment: Overall WFL for tasks assessed    Cervical / Trunk Assessment Cervical / Trunk Assessment: Other exceptions (increased body habitus)  Communication   Communication: No difficulties  Cognition Arousal/Alertness: Awake/alert Behavior During Therapy: WFL for tasks assessed/performed Overall Cognitive Status: Within Functional Limits for tasks assessed                                          General Comments      Exercises     Assessment/Plan    PT Assessment Patient does not need any further PT services  PT Problem List         PT Treatment Interventions      PT Goals (Current goals can be found in the Care Plan section)  Acute Rehab PT Goals Patient Stated Goal: go home today PT Goal Formulation: All assessment and education complete, DC therapy    Frequency       Co-evaluation               AM-PAC PT "6 Clicks" Mobility  Outcome Measure Help needed turning from your back to your side while in a flat bed without using bedrails?: None Help needed moving from lying on your back to  sitting on the side of a flat bed without using bedrails?: None Help needed moving to and from a bed to a chair (including a wheelchair)?: None Help needed standing up from a chair using your arms (e.g., wheelchair or bedside chair)?: None Help needed to walk in hospital room?: None Help needed climbing 3-5 steps with a railing? : None 6 Click Score: 24    End of Session   Activity Tolerance: Patient tolerated treatment well Patient left: in bed;with call bell/phone within reach   PT Visit Diagnosis: Other abnormalities of gait and mobility (R26.89)    Time: 3716-9678 PT Time Calculation (min) (ACUTE ONLY): 9 min   Charges:   PT Evaluation $PT Eval Low Complexity: [place], PT Acute Rehabilitation Services  Office [phone]   Rexanne Mano 04/25/2022, 10:35 AM

## 2022-04-25 NOTE — ED Notes (Signed)
Report given to Mickel Baas RN @ [facility]

## 2022-04-25 NOTE — Evaluation (Signed)
Occupational Therapy Evaluation Patient Details Name: Barbara Kramer MRN: [id number] DOB: [birth year] Today's Date: 04/25/2022   History of Present Illness 72 y.o. female who presented to the emergency room 04/24/22 with left facial droop that lasted less than 10 minutes. MRI Acute Right MCA territory ischemia. PMH significant for essential hypertension, obstructive sleep apnea who had stopped CPAP, and osteoarthritis   Clinical Impression   PTA patient independent, working and driving. Reports using rollator when needed.  Admitted for above and presents near baseline at modified independence to supervision level for mobility and ADLS. Pt lethargic initially but improved with mobility in room. Cognition, vision, strength WFL. Reports chronic tingling in L fingers at baseline. Reviewed BEFAST for signs/symptoms of CVA. Based on performance today, no further OT needs identified and OT will sign off.       Recommendations for follow up therapy are one component of a multi-disciplinary discharge planning process, led by the attending physician.  Recommendations may be updated based on patient status, additional functional criteria and insurance authorization.   Follow Up Recommendations  No OT follow up     Assistance Recommended at Discharge None  Patient can return home with the following      Functional Status Assessment     Equipment Recommendations  None recommended by OT    Recommendations for Other Services       Precautions / Restrictions Precautions Precautions: None Restrictions Weight Bearing Restrictions: No      Mobility Bed Mobility Overal bed mobility: Modified Independent             General bed mobility comments: HOB elevated but no assist required    Transfers Overall transfer level: Modified independent Equipment used: None                      Balance Overall balance assessment: No apparent balance deficits (not formally assessed)                                          ADL either performed or assessed with clinical judgement   ADL Overall ADL's : Needs assistance/impaired     Grooming: Modified independent;Standing;Wash/dry hands;Oral care           Upper Body Dressing : Modified independent;Sitting   Lower Body Dressing: Modified independent;Sit to/from stand;With adaptive equipment   Toilet Transfer: Modified Independent;Ambulation;Rolling walker (2 wheels)   Toileting- Clothing Manipulation and Hygiene: Modified independent       Functional mobility during ADLs: Supervision/safety General ADL Comments: using IV pole to bathroom but no support upon return to EOB, supervision for line mgmt     Vision Baseline Vision/History: 1 Wears glasses Ability to See in Adequate Light: 0 Adequate Patient Visual Report: No change from baseline Vision Assessment?: No apparent visual deficits Additional Comments: brief assessment with [facility]     Perception     Praxis      Pertinent Vitals/Pain Pain Assessment Pain Assessment: No/denies pain     Hand Dominance Right   Extremity/Trunk Assessment Upper Extremity Assessment Upper Extremity Assessment: Generalized weakness (mild numbness in L fingertips, but pt reports this is chronic)   Lower Extremity Assessment Lower Extremity Assessment: Defer to PT evaluation   Cervical / Trunk Assessment Cervical / Trunk Assessment: Other exceptions (increased body habitus)   Communication Communication Communication: No difficulties   Cognition Arousal/Alertness: Awake/alert Behavior During Therapy:  WFL for tasks assessed/performed Overall Cognitive Status: Within Functional Limits for tasks assessed                                       General Comments       Exercises     Shoulder Instructions      Home Living Family/patient expects to be discharged to:: Private residence Living Arrangements: Children (son)   Type of  Home: House Home Access: Stairs to enter Technical brewer of Steps: 3-4 Entrance Stairs-Rails: Can reach both Home Layout: One level     Bathroom Shower/Tub: Teacher, early years/pre: Standard (comfort height)     Home Equipment: Shower seat;Grab bars - tub/shower;Rolling Environmental consultant (2 wheels);Rollator (4 wheels);Adaptive equipment Adaptive Equipment: Sock aid        Prior Functioning/Environment Prior Level of Function : Independent/Modified Independent;Driving;Working/employed             Mobility Comments: has rollator and uses at times ADLs Comments: Careers information officer program        OT Problem List:        OT Treatment/Interventions:      OT Goals(Current goals can be found in the care plan section) Acute Rehab OT Goals Patient Stated Goal: home OT Goal Formulation: With patient  OT Frequency:      Co-evaluation              AM-PAC OT "6 Clicks" Daily Activity     Outcome Measure Help from another person eating meals?: None Help from another person taking care of personal grooming?: None Help from another person toileting, which includes using toliet, bedpan, or urinal?: None Help from another person bathing (including washing, rinsing, drying)?: A Little Help from another person to put on and taking off regular upper body clothing?: None Help from another person to put on and taking off regular lower body clothing?: A Little 6 Click Score: 22   End of Session Nurse Communication: Mobility status  Activity Tolerance: Patient tolerated treatment well Patient left: with call bell/phone within reach;Other (comment);with nursing/sitter in room (seated EOB)  OT Visit Diagnosis: Other abnormalities of gait and mobility (R26.89);Other symptoms and signs involving the nervous system (R29.898)                Time: 3570-1779 OT Time Calculation (min): 21 min Charges:  OT General Charges $OT Visit: 1 Visit OT Evaluation $OT Eval  Low Complexity: Barbara Kramer, OT Acute Rehabilitation Services Office [phone]   Barbara Kramer 04/25/2022, 10:25 AM

## 2022-04-25 NOTE — Progress Notes (Signed)
Patient was admitted earlier this morning.  H&P reviewed.  Patient seen and examined. Patient mentioned that her symptoms have resolved.  She did come in with left-sided facial droop.  Denies any weakness on any 1 side of her body.  Vital signs reviewed. Lungs are clear to auscultation bilaterally S1-S2 is normal regular No facial asymmetry noted.  Motor strength seems to be equal bilateral upper and lower extremities.  MRI did show right MCA infarcts.  LDL is 80.  HbA1c is pending.  Echocardiogram is pending.  Patient here with acute stroke.  Also has right terminal ICA stenosis.  Neurology has been consulted.  Management deferred to neurology at this time.  Patient noted to be on aspirin and Plavix.  Also on statin.  PT OT evaluation is pending.  See H&P for details.  Will continue to monitor.  Bonnielee Haff 04/25/2022

## 2022-04-25 NOTE — Progress Notes (Addendum)
STROKE TEAM PROGRESS NOTE   INTERVAL HISTORY No family at the bedside. PT/OT recommending no follow up at this time. Start DAPT therapy. She reports increased stress as her sisters health has been declining and she is one of the primary caregivers.  She tells me that they are looking into facilities for her as well.  She has also been under increased stress at work.  She additionally tells me that she does not always take her blood pressure medication because she thinks she can tell when her blood pressure is low.  I encouraged her to log her blood pressures for her primary care provider.  She does tell me that she has her own blood pressure cuff at home.  Vitals:   04/25/22 0300 04/25/22 0342 04/25/22 0403 04/25/22 0825  BP: (!) 173/87 (!) 153/83  (!) 157/82  Pulse: 65 74  82  Resp: '15 16  16  '$ Temp:  97.8 F (36.6 C)  97.6 F (36.4 C)  TempSrc:  Oral  Oral  SpO2: 100% 100%  100%  Weight:   128.5 kg   Height:   '5\' 2"'$  (1.575 m)    CBC:  Recent Labs  Lab 04/24/22 1915 04/25/22 0702  WBC 9.5 8.7  NEUTROABS 6.0  --   HGB 13.9 12.5  HCT 42.9 38.5  MCV 86.0 85.9  PLT 273 481   Basic Metabolic Panel:  Recent Labs  Lab 04/24/22 1915 04/25/22 0702  NA 137 136  K 3.8 3.6  CL 99 103  CO2 31 25  GLUCOSE 119* 90  BUN 16 11  CREATININE 1.11* 0.92  CALCIUM 10.4* 9.2   Lipid Panel:  Recent Labs  Lab 04/25/22 0702  CHOL 137  TRIG 52  HDL 47  CHOLHDL 2.9  VLDL 10  LDLCALC 80   HgbA1c: No results for input(s): "HGBA1C" in the last 168 hours. Urine Drug Screen: No results for input(s): "LABOPIA", "COCAINSCRNUR", "LABBENZ", "AMPHETMU", "THCU", "LABBARB" in the last 168 hours.  Alcohol Level  Recent Labs  Lab 04/24/22 1915  ETH <10    IMAGING past 24 hours MR BRAIN WO CONTRAST  Result Date: 04/25/2022 CLINICAL DATA:  72 year old female with headache, TIA. Near occlusion of the right ICA terminus, proximal right MCA. EXAM: MRI HEAD WITHOUT CONTRAST TECHNIQUE: Multiplanar,  multiecho pulse sequences of the brain and surrounding structures were obtained without intravenous contrast. COMPARISON:  CT head and CTA head and neck yesterday. FINDINGS: Brain: Small round 3-4 mm focus of diffusion restriction in the ventral right motor gyrus on series 5, image 96, near upper extremity representation area. Similar small postcentral right parietal focus of restricted diffusion on series 5, image 94., and confluent roughly 1.5 cm area of cortical restricted diffusion along the posterior right temporal lobe best seen on series 5, images 73-75. Subtle associated T2 and FLAIR hyperintense cytotoxic edema. No convincing hemorrhage. No contralateral left hemisphere or posterior fossa restricted diffusion. No midline shift, mass effect, evidence of mass lesion, ventriculomegaly, extra-axial collection or acute intracranial hemorrhage. Cervicomedullary junction and pituitary are within normal limits. Scattered and patchy bilateral white matter T2 and FLAIR hyperintensity, moderate for age and slightly greater on the right side. No chronic cortical encephalomalacia or definite chronic cerebral blood products. Deep gray matter nuclei, brainstem and cerebellum are negative. Vascular: Major intracranial vascular flow voids are preserved. Skull and upper cervical spine: Partially visible cervical spine disc and endplate degeneration. Background bone marrow signal within normal limits. Sinuses/Orbits: Negative orbits. Maxillary alveolar recess mucosal thickening  is mild. Circumscribed 13-16 mm cyst of the right nasal labial fold on series 10, image 3 and series 11, image 2 has a benign appearance. Other: Mastoids are well aerated. Grossly normal visible internal auditory structures. Visualized scalp soft tissues are within normal limits. IMPRESSION: 1. Positive for Acute Right MCA territory ischemia. Several small cortical and/or subcortical infarcts involving the lateral posterior right temporal lobe cortex,  superior perirolandic area. No associated hemorrhage or mass effect. 2. No other acute intracranial abnormality. Underlying moderate for age cerebral white matter signal changes, most commonly due to chronic small vessel disease. 3. Benign appearing roughly 16 mm cyst of the right face nasolabial junction (series 11, image 2). Electronically Signed   By: Genevie Ann M.D.   On: 04/25/2022 06:17   CT ANGIO HEAD NECK W WO CM  Result Date: 04/24/2022 CLINICAL DATA:  Initial evaluation for acute TIA. EXAM: CT ANGIOGRAPHY HEAD AND NECK TECHNIQUE: Multidetector CT imaging of the head and neck was performed using the standard protocol during bolus administration of intravenous contrast. Multiplanar CT image reconstructions and MIPs were obtained to evaluate the vascular anatomy. Carotid stenosis measurements (when applicable) are obtained utilizing NASCET criteria, using the distal internal carotid diameter as the denominator. RADIATION DOSE REDUCTION: This exam was performed according to the departmental dose-optimization program which includes automated exposure control, adjustment of the mA and/or kV according to patient size and/or use of iterative reconstruction technique. CONTRAST:  34m OMNIPAQUE IOHEXOL 350 MG/ML SOLN COMPARISON:  Prior CT from earlier the same day. FINDINGS: CTA NECK FINDINGS Aortic arch: Visualized aortic arch normal caliber with sooner branch pattern. Mild atheromatous change about the arch itself. No stenosis about the origin the great vessels. Right carotid system: Right common and internal carotid arteries are tortuous without dissection or stenosis. Left carotid system: Left common and internal carotid arteries are tortuous without dissection or stenosis. Vertebral arteries: Both vertebral arteries arise from subclavian arteries. No proximal subclavian artery stenosis. Vertebral arteries are patent without dissection or stenosis. Skeleton: Moderate to advanced degenerative spondylosis  throughout the visualized cervicothoracic spine. No worrisome osseous lesions. Other neck: No other acute soft tissue abnormality in neck. Upper chest: Visualized upper chest demonstrates no acute finding. Review of the MIP images confirms the above findings CTA HEAD FINDINGS Anterior circulation: Atheromatous change within the carotid siphons with associated moderate to severe stenoses about the para clinoid segments, right worse than left. A1 segments patent bilaterally. Normal anterior communicating artery complex. Anterior cerebral arteries patent without stenosis. Left M1 segment and distal left MCA branches are well perfused and patent. On the right, there is a severe near occlusive stenosis at the right ICA terminus/proximal right M1 segment (series 6, image 104). Right M1 segment patent distally. No proximal MCA branch occlusion or high-grade stenosis. Distal right MCA branches well perfused. Posterior circulation: Both V4 segments patent without stenosis. Left PICA patent. Right PICA not well seen. Short-segment moderate proximal basilar stenosis (series 6, image 120). Basilar otherwise patent to its distal aspect. Superior cerebral arteries patent bilaterally. Both PCAs primarily supplied via the basilar. Focal severe left P2 stenosis (series 7, image 22). PCAs otherwise patent to their distal aspects without significant stenosis. Venous sinuses: Grossly patent allowing for timing the contrast bolus. Anatomic variants: None significant.  No aneurysm. Review of the MIP images confirms the above findings IMPRESSION: 1. Negative CTA for acute large vessel occlusion. 2. Severe near occlusive stenosis at the right ICA terminus/proximal right M1 segment. 3. Atheromatous change within the  carotid siphons with associated moderate to severe stenoses about the para clinoid segments, right worse than left. 4. Short-segment moderate proximal basilar stenosis, with additional focal severe left P2 stenosis. 5. Diffuse  tortuosity of the major arterial vasculature of the neck, suggesting chronic underlying hypertension. Electronically Signed   By: Jeannine Boga M.D.   On: 04/24/2022 23:55   CT HEAD WO CONTRAST  Result Date: 04/24/2022 CLINICAL DATA:  Headaches, twisting of the mouth EXAM: CT HEAD WITHOUT CONTRAST TECHNIQUE: Contiguous axial images were obtained from the base of the skull through the vertex without intravenous contrast. RADIATION DOSE REDUCTION: This exam was performed according to the departmental dose-optimization program which includes automated exposure control, adjustment of the mA and/or kV according to patient size and/or use of iterative reconstruction technique. COMPARISON:  None Available. FINDINGS: Brain: No acute territorial infarction, hemorrhage or intracranial mass. Mild white matter hypodensity. The ventricles are nonenlarged. Vascular: No hyperdense vessels.  Carotid vascular calcification Skull: Normal. Negative for fracture or focal lesion. Sinuses/Orbits: No acute finding. Other: None IMPRESSION: 1. No CT evidence for acute intracranial abnormality. 2. Mild small vessel ischemic changes of the white matter. Electronically Signed   By: Donavan Foil M.D.   On: 04/24/2022 19:24    PHYSICAL EXAM  Physical Exam  Constitutional: Appears well-developed and well-nourished.   Cardiovascular: Normal rate and regular rhythm.  Respiratory: Effort normal, non-labored breathing  Neuro: Mental Status: Patient is awake, alert, oriented to person, place, month, year, and situation. Patient is able to give a clear and coherent history. No signs of aphasia or neglect Cranial Nerves: II: Visual Fields are full. Pupils are equal, round, and reactive to light.   III,IV, VI: EOMI without ptosis or diploplia.  V: Facial sensation is symmetric to temperature VII: Facial movement is symmetric resting and smiling VIII: Hearing is intact to voice X: Palate elevates symmetrically XI: Shoulder  shrug is symmetric. XII: Tongue protrudes midline without atrophy or fasciculations.  Motor: Tone is normal. Bulk is normal. 5/5 strength was present in all four extremities.  Sensory: Sensation is symmetric to light touch  Cerebellar: FNF and HKS are intact bilaterally Gait is steady   ASSESSMENT/PLAN Barbara Kramer is a 72 y.o. female with history of hypertension, sleep apnea not on CPAP, prediabetes, osteoarthritis of the knees and shoulders, BMI 51.81, remote smoking presenting with left facial droop.  Punctate strokes in the right MCA distribution most likely secondary to significant intracranial atherosclerotic disease.  Stroke:  Punctate strokes in the right MCA territory Etiology:  significant intracranial atherosclerotic disease Code Stroke CT head No acute abnormality.  CTA head & neck Severe near occlusive stenosis at the right ICA terminus/proximal right M1 segment. Atheromatous change within the carotid siphons with associated moderate to severe stenoses about the para clinoid segments, right worse than left. Short-segment moderate proximal basilar stenosis, with additional focal severe left P2 stenosis. Diffuse tortuosity of the major arterial vasculature of the neck, suggesting chronic underlying hypertension. MRI  Positive for Acute Right MCA territory ischemia. Several small cortical and/or subcortical infarcts involving the lateral posterior right temporal lobe cortex, superior perirolandic area 2D Echo Pending LDL 80 HgbA1c 4.7 VTE prophylaxis - lovenox    Diet   Diet Heart Room service appropriate? Yes; Fluid consistency: Thin   No antithrombotic prior to admission, now on aspirin 81 mg daily and clopidogrel 75 mg daily.  Therapy recommendations: No follow up Disposition:  pending  Hypertension Home meds:  chlorthalidone- does not take consistently  Stable Permissive hypertension (OK if < 220/120) but gradually normalize in 5-7 days Long-term BP goal  normotensive Agreeable to log blood pressures for PCP  Hyperlipidemia LDL 80, goal < 70 Add Atorvastatin '20mg'$   Continue statin at discharge  Other Stroke Risk Factors Advanced Age >/= 20  Obesity, Body mass index is 51.81 kg/m., BMI >/= 30 associated with increased stroke risk, recommend weight loss, diet and exercise as appropriate   Other Active Problems Caregiver burnout- Her brother in law and her are looking for facilities to assist in caring for her sister who has alzheimers  Hospital day # 0  Patient seen and examined by NP/APP with MD. MD to update note as needed.   Janine Ores, DNP, FNP-BC [facility] Pager: [phone]  ATTENDING ATTESTATION:  Pt plans discussed with NP. Same day note, no charge.  Boby Eyer,MD    To contact Stroke Continuity provider, please refer to [email or web address] After hours, contact General Neurology

## 2022-04-25 NOTE — Assessment & Plan Note (Addendum)
-   This was manifested by left facial droop that resolved. - She will be admitted to an observation medical telemetry bed. - We will follow neurochecks every 4 hours for 24 hours. - Will obtain a brain MRI without contrast as well as 2D echo with bubble study. - Neurology consult will be obtained.  Dr. Curly Shores was notified about the patient - She will be placed on aspirin and Plavix. - Statin therapy will be provided and fasting lipids will be checked. - PT/OT and ST consults will be obtained.

## 2022-04-25 NOTE — Assessment & Plan Note (Signed)
-   The patient will be placed on aspirin and Plavix as mentioned above. - Vascular surgery consult can be obtained this a.m. - We will review neurology recommendations as well.

## 2022-04-25 NOTE — H&P (Signed)
[place]   PATIENT NAME: Barbara Kramer    MR#:  [id number]  DATE OF BIRTH:  1950-07-08  DATE OF ADMISSION:  04/24/2022  PRIMARY CARE PHYSICIAN: Tower, Wynelle Fanny, MD   Patient is coming from: Home  REQUESTING/REFERRING PHYSICIAN: Regan Lemming, MD ([facility])  CHIEF COMPLAINT:  No chief complaint on file.   HISTORY OF PRESENT ILLNESS:  Barbara Kramer is a 72 y.o. female with medical history significant for essential hypertension, obstructive sleep apnea who had stopped CPAP, and osteoarthritis, who presented to the emergency room with a [place] of left facial droop that lasted less than 10 minutes.  She denies any dysarthria or dysphagia or expressive aphasia.  She denied any presyncope or syncope.  She stated that while she was chewing she thought she has bitten her lip and then noticed that her mouth was twisted.  She denied any other focal muscle weakness or paresthesias.  No tinnitus or vertigo.  No chest pain or palpitations.  No nausea vomiting or abdominal pain.  No cough or wheezing or dyspnea.  No weakness seizures.  ED Course: When she came to the ER, BP was 170/80 with otherwise normal vital signs.  Later on BP was 153/83.  Labs revealed calcium of 10.4 with otherwise unremarkable CMP.  CBC was within normal.  Alcohol level was less than 10. EKG as reviewed by me : Normal sinus rhythm with a rate of 65. Imaging: Noncontrasted CT scan revealed mild small vessel ischemic changes of the white matter with no acute intracranial normalities.  CTA of the head and neck though revealed the following: 1. Negative CTA for acute large vessel occlusion. 2. Severe near occlusive stenosis at the right ICA terminus/proximal right M1 segment. 3. Atheromatous change within the carotid siphons with associated moderate to severe stenoses about the para clinoid segments, right worse than left. 4. Short-segment moderate proximal basilar stenosis, with additional focal severe  left P2 stenosis. 5. Diffuse tortuosity of the major arterial vasculature of the neck, suggesting chronic underlying hypertension  The patient had a teleneuro consult and 325 mg of p.o. aspirin as well as loading dose of 300 mg p.o. Plavix were given. PAST MEDICAL HISTORY:   Past Medical History:  Diagnosis Date   Anemia    Arthritis    Breast lump    left   Eczema    Family hx of colon cancer    Gallbladder problem    Hypertension    Kidney cysts    small, right 10/2002   Normal cardiac stress test 11/2008   exercise stress test normal but limited by exercise intolerance   Obesity    Shortness of breath    Sleep apnea     PAST SURGICAL HISTORY:   Past Surgical History:  Procedure Laterality Date   CHOLECYSTECTOMY  05/2002   [place]- gallstones, small renal cyst 05/2002   COLONOSCOPY WITH PROPOFOL N/A 07/15/2019   Procedure: COLONOSCOPY WITH PROPOFOL;  Surgeon: Yetta Flock, MD;  Location: [facility];  Service: Gastroenterology;  Laterality: N/A;   FOOT SURGERY  11/1998   bunion   PARTIAL HYSTERECTOMY  1998   fibroids, ovaries remain   POLYPECTOMY  07/15/2019   Procedure: POLYPECTOMY;  Surgeon: Yetta Flock, MD;  Location: [facility];  Service: Gastroenterology;;    SOCIAL HISTORY:   Social History   Tobacco Use   Smoking status: Former    Packs/day: 0.25    Years: 5.00    Total pack  years: 1.25    Types: Cigarettes    Quit date: 03/20/1976    Years since quitting: 46.1   Smokeless tobacco: Never  Substance Use Topics   Alcohol use: No    Alcohol/week: 0.0 standard drinks of alcohol    FAMILY HISTORY:   Family History  Problem Relation Age of Onset   Colon cancer Mother        86's   Stomach cancer Mother    Cancer Mother    Lung cancer Father        smoker   Cancer Father    Hypertension Sister    Dementia Sister    Hypertension Sister    Stroke Maternal Grandmother    Heart attack Maternal Grandfather    Stroke Paternal Grandmother     Colon polyps Daughter     DRUG ALLERGIES:   Allergies  Allergen Reactions   Calcium Carbonate     REACTION: constipation   Penicillins Rash    Has patient had a PCN reaction causing immediate rash, facial/tongue/throat swelling, SOB or lightheadedness with hypotension: no Has patient had a PCN reaction causing severe rash involving mucus membranes or skin necrosis: no Has patient had a PCN reaction that required hospitalization: no Has patient had a PCN reaction occurring within the last 10 years: no If all of the above answers are "NO", then may proceed with Cephalosporin use.     REVIEW OF SYSTEMS:   ROS As per history of present illness. All pertinent systems were reviewed above. Constitutional, HEENT, cardiovascular, respiratory, GI, GU, musculoskeletal, neuro, psychiatric, endocrine, integumentary and hematologic systems were reviewed and are otherwise negative/unremarkable except for positive findings mentioned above in the HPI.   MEDICATIONS AT HOME:   Prior to Admission medications   Medication Sig Start Date End Date Taking? Authorizing Provider  chlorthalidone (HYGROTON) 25 MG tablet Take 25 mg by mouth daily. Taking 0.'5mg'$  once a day as needed    [provider]  triamcinolone cream (KENALOG) 0.1 % Apply 1 application topically 2 (two) times daily. Small amount to affected area 10/28/20   Tower, Wynelle Fanny, MD      VITAL SIGNS:  Blood pressure (!) 153/83, pulse 74, temperature 97.8 F (36.6 C), temperature source Oral, resp. rate 16, height '5\' 2"'$  (1.575 m), weight 128.5 kg, SpO2 100 %.  PHYSICAL EXAMINATION:  Physical Exam  GENERAL:  72 y.o.-year-old patient lying in the bed with no acute distress.  EYES: Pupils equal, round, reactive to light and accommodation. No scleral icterus. Extraocular muscles intact.  HEENT: Head atraumatic, normocephalic. Oropharynx and nasopharynx clear.  NECK:  Supple, no jugular venous distention. No thyroid enlargement, no  tenderness.  LUNGS: Normal breath sounds bilaterally, no wheezing, rales,rhonchi or crepitation. No use of accessory muscles of respiration.  CARDIOVASCULAR: Regular rate and rhythm, S1, S2 normal. No murmurs, rubs, or gallops.  ABDOMEN: Soft, nondistended, nontender. Bowel sounds present. No organomegaly or mass.  EXTREMITIES: No pedal edema, cyanosis, or clubbing.  NEUROLOGIC: Cranial nerves II through XII are intact. Muscle strength 5/5 in all extremities. Sensation intact. Gait not checked.  PSYCHIATRIC: The patient is alert and oriented x 3.  Normal affect and good eye contact. SKIN: No obvious rash, lesion, or ulcer.   LABORATORY PANEL:   CBC Recent Labs  Lab 04/24/22 1915  WBC 9.5  HGB 13.9  HCT 42.9  PLT 273   ------------------------------------------------------------------------------------------------------------------  Chemistries  Recent Labs  Lab 04/24/22 1915  NA 137  K 3.8  CL 99  CO2 31  GLUCOSE 119*  BUN 16  CREATININE 1.11*  CALCIUM 10.4*  AST 14*  ALT 11  ALKPHOS 55  BILITOT 0.3   ------------------------------------------------------------------------------------------------------------------  Cardiac Enzymes No results for input(s): "TROPONINI" in the last 168 hours. ------------------------------------------------------------------------------------------------------------------  RADIOLOGY:  MR BRAIN WO CONTRAST  Result Date: 04/25/2022 CLINICAL DATA:  72 year old female with headache, TIA. Near occlusion of the right ICA terminus, proximal right MCA. EXAM: MRI HEAD WITHOUT CONTRAST TECHNIQUE: Multiplanar, multiecho pulse sequences of the brain and surrounding structures were obtained without intravenous contrast. COMPARISON:  CT head and CTA head and neck yesterday. FINDINGS: Brain: Small round 3-4 mm focus of diffusion restriction in the ventral right motor gyrus on series 5, image 96, near upper extremity representation area. Similar small  postcentral right parietal focus of restricted diffusion on series 5, image 94., and confluent roughly 1.5 cm area of cortical restricted diffusion along the posterior right temporal lobe best seen on series 5, images 73-75. Subtle associated T2 and FLAIR hyperintense cytotoxic edema. No convincing hemorrhage. No contralateral left hemisphere or posterior fossa restricted diffusion. No midline shift, mass effect, evidence of mass lesion, ventriculomegaly, extra-axial collection or acute intracranial hemorrhage. Cervicomedullary junction and pituitary are within normal limits. Scattered and patchy bilateral white matter T2 and FLAIR hyperintensity, moderate for age and slightly greater on the right side. No chronic cortical encephalomalacia or definite chronic cerebral blood products. Deep gray matter nuclei, brainstem and cerebellum are negative. Vascular: Major intracranial vascular flow voids are preserved. Skull and upper cervical spine: Partially visible cervical spine disc and endplate degeneration. Background bone marrow signal within normal limits. Sinuses/Orbits: Negative orbits. Maxillary alveolar recess mucosal thickening is mild. Circumscribed 13-16 mm cyst of the right nasal labial fold on series 10, image 3 and series 11, image 2 has a benign appearance. Other: Mastoids are well aerated. Grossly normal visible internal auditory structures. Visualized scalp soft tissues are within normal limits. IMPRESSION: 1. Positive for Acute Right MCA territory ischemia. Several small cortical and/or subcortical infarcts involving the lateral posterior right temporal lobe cortex, superior perirolandic area. No associated hemorrhage or mass effect. 2. No other acute intracranial abnormality. Underlying moderate for age cerebral white matter signal changes, most commonly due to chronic small vessel disease. 3. Benign appearing roughly 16 mm cyst of the right face nasolabial junction (series 11, image 2). Electronically  Signed   By: Genevie Ann M.D.   On: 04/25/2022 06:17   CT ANGIO HEAD NECK W WO CM  Result Date: 04/24/2022 CLINICAL DATA:  Initial evaluation for acute TIA. EXAM: CT ANGIOGRAPHY HEAD AND NECK TECHNIQUE: Multidetector CT imaging of the head and neck was performed using the standard protocol during bolus administration of intravenous contrast. Multiplanar CT image reconstructions and MIPs were obtained to evaluate the vascular anatomy. Carotid stenosis measurements (when applicable) are obtained utilizing NASCET criteria, using the distal internal carotid diameter as the denominator. RADIATION DOSE REDUCTION: This exam was performed according to the departmental dose-optimization program which includes automated exposure control, adjustment of the mA and/or kV according to patient size and/or use of iterative reconstruction technique. CONTRAST:  91m OMNIPAQUE IOHEXOL 350 MG/ML SOLN COMPARISON:  Prior CT from earlier the same day. FINDINGS: CTA NECK FINDINGS Aortic arch: Visualized aortic arch normal caliber with sooner branch pattern. Mild atheromatous change about the arch itself. No stenosis about the origin the great vessels. Right carotid system: Right common and internal carotid arteries are tortuous without dissection or stenosis. Left carotid system: Left common and  internal carotid arteries are tortuous without dissection or stenosis. Vertebral arteries: Both vertebral arteries arise from subclavian arteries. No proximal subclavian artery stenosis. Vertebral arteries are patent without dissection or stenosis. Skeleton: Moderate to advanced degenerative spondylosis throughout the visualized cervicothoracic spine. No worrisome osseous lesions. Other neck: No other acute soft tissue abnormality in neck. Upper chest: Visualized upper chest demonstrates no acute finding. Review of the MIP images confirms the above findings CTA HEAD FINDINGS Anterior circulation: Atheromatous change within the carotid siphons with  associated moderate to severe stenoses about the para clinoid segments, right worse than left. A1 segments patent bilaterally. Normal anterior communicating artery complex. Anterior cerebral arteries patent without stenosis. Left M1 segment and distal left MCA branches are well perfused and patent. On the right, there is a severe near occlusive stenosis at the right ICA terminus/proximal right M1 segment (series 6, image 104). Right M1 segment patent distally. No proximal MCA branch occlusion or high-grade stenosis. Distal right MCA branches well perfused. Posterior circulation: Both V4 segments patent without stenosis. Left PICA patent. Right PICA not well seen. Short-segment moderate proximal basilar stenosis (series 6, image 120). Basilar otherwise patent to its distal aspect. Superior cerebral arteries patent bilaterally. Both PCAs primarily supplied via the basilar. Focal severe left P2 stenosis (series 7, image 22). PCAs otherwise patent to their distal aspects without significant stenosis. Venous sinuses: Grossly patent allowing for timing the contrast bolus. Anatomic variants: None significant.  No aneurysm. Review of the MIP images confirms the above findings IMPRESSION: 1. Negative CTA for acute large vessel occlusion. 2. Severe near occlusive stenosis at the right ICA terminus/proximal right M1 segment. 3. Atheromatous change within the carotid siphons with associated moderate to severe stenoses about the para clinoid segments, right worse than left. 4. Short-segment moderate proximal basilar stenosis, with additional focal severe left P2 stenosis. 5. Diffuse tortuosity of the major arterial vasculature of the neck, suggesting chronic underlying hypertension. Electronically Signed   By: Jeannine Boga M.D.   On: 04/24/2022 23:55   CT HEAD WO CONTRAST  Result Date: 04/24/2022 CLINICAL DATA:  Headaches, twisting of the mouth EXAM: CT HEAD WITHOUT CONTRAST TECHNIQUE: Contiguous axial images were  obtained from the base of the skull through the vertex without intravenous contrast. RADIATION DOSE REDUCTION: This exam was performed according to the departmental dose-optimization program which includes automated exposure control, adjustment of the mA and/or kV according to patient size and/or use of iterative reconstruction technique. COMPARISON:  None Available. FINDINGS: Brain: No acute territorial infarction, hemorrhage or intracranial mass. Mild white matter hypodensity. The ventricles are nonenlarged. Vascular: No hyperdense vessels.  Carotid vascular calcification Skull: Normal. Negative for fracture or focal lesion. Sinuses/Orbits: No acute finding. Other: None IMPRESSION: 1. No CT evidence for acute intracranial abnormality. 2. Mild small vessel ischemic changes of the white matter. Electronically Signed   By: Donavan Foil M.D.   On: 04/24/2022 19:24      IMPRESSION AND PLAN:  Assessment and Plan: * TIA (transient ischemic attack) - This was manifested by left facial droop that resolved. - She will be admitted to an observation medical telemetry bed. - We will follow neurochecks every 4 hours for 24 hours. - Will obtain a brain MRI without contrast as well as 2D echo with bubble study. - Neurology consult will be obtained.  Dr. Curly Shores was notified about the patient - She will be placed on aspirin and Plavix. - Statin therapy will be provided and fasting lipids will be checked. - PT/OT  and ST consults will be obtained.  Intracranial carotid stenosis - The patient will be placed on aspirin and Plavix as mentioned above. - Vascular surgery consult can be obtained this a.m. - We will review neurology recommendations as well.  Essential hypertension - We will hold off antihypertensives pending brain MRI without contrast. - Permissive hypertension will be allowed.   DVT prophylaxis: Lovenox.  Advanced Care Planning:  Code Status: full code.  Family Communication:  The plan of care  was discussed in details with the patient (and family). I answered all questions. The patient agreed to proceed with the above mentioned plan. Further management will depend upon hospital course. Disposition Plan: Back to previous home environment Consults called: Neurology All the records are reviewed and case discussed with ED provider.  Status is: Observation   I certify that at the time of admission, it is my clinical judgment that the patient will require  hospital care extending less than 2 midnights.                            Dispo: The patient is from: Home              Anticipated d/c is to: Home              Patient currently is not medically stable to d/c.              Difficult to place patient: No  Christel Mormon M.D on 04/25/2022 at 6:31 AM  [facility]   From 7 PM-7 AM, contact night-coverage [email or web address]  CC: Primary care physician; Tower, Wynelle Fanny, MD

## 2022-04-25 NOTE — Plan of Care (Signed)
  Problem: Education: Goal: Knowledge of General Education information will improve Description: Including pain rating scale, medication(s)/side effects and non-pharmacologic comfort measures Outcome: Progressing   Problem: Health Behavior/Discharge Planning: Goal: Ability to manage health-related needs will improve Outcome: Progressing   Problem: Clinical Measurements: Goal: Ability to maintain clinical measurements within normal limits will improve Outcome: Progressing Goal: Will remain free from infection Outcome: Progressing Goal: Diagnostic test results will improve Outcome: Progressing Goal: Respiratory complications will improve Outcome: Progressing Goal: Cardiovascular complication will be avoided Outcome: Progressing   Problem: Activity: Goal: Risk for activity intolerance will decrease Outcome: Progressing   Problem: Nutrition: Goal: Adequate nutrition will be maintained Outcome: Progressing   Problem: Coping: Goal: Level of anxiety will decrease Outcome: Progressing   Problem: Elimination: Goal: Will not experience complications related to bowel motility Outcome: Progressing Goal: Will not experience complications related to urinary retention Outcome: Progressing   Problem: Pain Managment: Goal: General experience of comfort will improve Outcome: Progressing   Problem: Safety: Goal: Ability to remain free from injury will improve Outcome: Progressing   Problem: Skin Integrity: Goal: Risk for impaired skin integrity will decrease Outcome: Progressing   Problem: Education: Goal: Knowledge of disease or condition will improve Outcome: Progressing Goal: Knowledge of secondary prevention will improve (MUST DOCUMENT ALL) Outcome: Progressing Goal: Knowledge of patient specific risk factors will improve (Mark N/A or DELETE if not current risk factor) Outcome: Progressing   Problem: Ischemic Stroke/TIA Tissue Perfusion: Goal: Complications of ischemic  stroke/TIA will be minimized Outcome: Progressing   Problem: Coping: Goal: Will verbalize positive feelings about self Outcome: Progressing Goal: Will identify appropriate support needs Outcome: Progressing   Problem: Health Behavior/Discharge Planning: Goal: Ability to manage health-related needs will improve Outcome: Progressing Goal: Goals will be collaboratively established with patient/family Outcome: Progressing   Problem: Self-Care: Goal: Ability to participate in self-care as condition permits will improve Outcome: Progressing Goal: Verbalization of feelings and concerns over difficulty with self-care will improve Outcome: Progressing Goal: Ability to communicate needs accurately will improve Outcome: Progressing   Problem: Nutrition: Goal: Risk of aspiration will decrease Outcome: Progressing Goal: Dietary intake will improve Outcome: Progressing   Problem: Education: Goal: Knowledge of disease or condition will improve Outcome: Progressing Goal: Knowledge of secondary prevention will improve (MUST DOCUMENT ALL) Outcome: Progressing Goal: Knowledge of patient specific risk factors will improve (Mark N/A or DELETE if not current risk factor) Outcome: Progressing   Problem: Ischemic Stroke/TIA Tissue Perfusion: Goal: Complications of ischemic stroke/TIA will be minimized Outcome: Progressing   Problem: Coping: Goal: Will verbalize positive feelings about self Outcome: Progressing Goal: Will identify appropriate support needs Outcome: Progressing   Problem: Health Behavior/Discharge Planning: Goal: Ability to manage health-related needs will improve Outcome: Progressing Goal: Goals will be collaboratively established with patient/family Outcome: Progressing   Problem: Self-Care: Goal: Ability to participate in self-care as condition permits will improve Outcome: Progressing Goal: Verbalization of feelings and concerns over difficulty with self-care will  improve Outcome: Progressing Goal: Ability to communicate needs accurately will improve Outcome: Progressing   Problem: Nutrition: Goal: Risk of aspiration will decrease Outcome: Progressing Goal: Dietary intake will improve Outcome: Progressing   

## 2022-04-25 NOTE — ED Notes (Signed)
Report given to [facility] with [facility]

## 2022-04-25 NOTE — Progress Notes (Signed)
SLP Cancellation Note  Patient Details Name: Barbara Kramer MRN: [id number] DOB: 1950-07-31   Cancelled treatment:       Reason Eval/Treat Not Completed: SLP screened, no needs identified, will sign off   Juan Quam Laurice 04/25/2022, 12:15 PM

## 2022-04-25 NOTE — Consult Note (Signed)
Neurology Consultation Reason for Consult: Stroke Requesting Physician: Eugenie Norrie  CC: Left-sided facial droop, transient  History is obtained from: Patient and chart review  HPI: Barbara Kramer is a 72 y.o. female with a past medical history significant for hypertension, sleep apnea not on CPAP, prediabetes, osteoarthritis of the knees and shoulders, BMI 51.81, remote smoking  She was in her usual state of health on 2/5 when at 5 PM she began to feel like she was chewing on her tongue and had left-sided facial droop.  She activated EMS but fortunately by the time she arrived to the ED symptoms had essentially resolved.  She denies any other recent similar symptoms, noting the tingling she has bilateral hands.  She notes she stopped using her CPAP sometime ago due to concerns that the mask was affecting her teeth, but she is interested in restarting using it  She endorses some stressors lately as she is submitting a grant for community project and helping care for her sister with Alzheimer's disease  LKW: 1700 Thrombolytic given?: No, too mild to treat by Telespecialist eval IA performed?: No, exam not c/w LVO Premorbid modified rankin scale:      1 - No significant disability. Able to carry out all usual activities, despite some symptoms. Occasionally uses a walker due to knee arthritis  ROS: All other review of systems was negative except as noted in the HPI.   Past Medical History:  Diagnosis Date   Anemia    Arthritis    Breast lump    left   Eczema    Family hx of colon cancer    Gallbladder problem    Hypertension    Kidney cysts    small, right 10/2002   Normal cardiac stress test 11/2008   exercise stress test normal but limited by exercise intolerance   Obesity    Shortness of breath    Sleep apnea    Past Surgical History:  Procedure Laterality Date   CHOLECYSTECTOMY  05/2002   [place]- gallstones, small renal cyst 05/2002   COLONOSCOPY WITH PROPOFOL N/A 07/15/2019    Procedure: COLONOSCOPY WITH PROPOFOL;  Surgeon: Yetta Flock, MD;  Location: [facility];  Service: Gastroenterology;  Laterality: N/A;   FOOT SURGERY  11/1998   bunion   PARTIAL HYSTERECTOMY  1998   fibroids, ovaries remain   POLYPECTOMY  07/15/2019   Procedure: POLYPECTOMY;  Surgeon: Yetta Flock, MD;  Location: [facility];  Service: Gastroenterology;;   Current Outpatient Medications  Medication Instructions   chlorthalidone (HYGROTON) 25 mg, Oral, Daily, Taking 0.'5mg'$  once a day as needed    triamcinolone cream (KENALOG) 0.1 % 1 application , Topical, 2 times daily, Small amount to affected area     Family History  Problem Relation Age of Onset   Colon cancer Mother        13's   Stomach cancer Mother    Cancer Mother    Lung cancer Father        smoker   Cancer Father    Hypertension Sister    Dementia Sister    Hypertension Sister    Stroke Maternal Grandmother    Heart attack Maternal Grandfather    Stroke Paternal Grandmother    Colon polyps Daughter     Social History:  reports that she quit smoking about 46 years ago. Her smoking use included cigarettes. She has a 1.25 pack-year smoking history. She has never used smokeless tobacco. She reports that she does not  drink alcohol and does not use drugs.   Exam: Current vital signs: BP (!) 153/83 (BP Location: Left Arm)   Pulse 74   Temp 97.8 F (36.6 C) (Oral)   Resp 16   Ht '5\' 2"'$  (1.575 m)   Wt 128.5 kg   SpO2 100%   BMI 51.81 kg/m  Vital signs in last 24 hours: Temp:  [97.8 F (36.6 C)-98.7 F (37.1 C)] 97.8 F (36.6 C) (02/06 0342) Pulse Rate:  [65-81] 74 (02/06 0342) Resp:  [12-20] 16 (02/06 0342) BP: (135-173)/(60-87) 153/83 (02/06 0342) SpO2:  [97 %-100 %] 100 % (02/06 0342) Weight:  [128.5 kg-130.6 kg] 128.5 kg (02/06 0403)   Physical Exam  Constitutional: Appears well-developed and well-nourished.  Psych: Affect appropriate to situation, pleasant and cooperative Eyes: No  scleral injection HENT: No oropharyngeal obstruction.  MSK: no joint deformities.  Cardiovascular: Normal rate and regular rhythm. Perfusing extremities well Respiratory: Effort normal, non-labored breathing GI: Soft.  No distension. There is no tenderness.  Skin: Warm dry and intact visible skin  Neuro: Mental Status: Patient is awake, alert, oriented to person, place, month, year, and situation. Patient is able to give a clear and coherent history. No signs of aphasia or neglect Cranial Nerves: II: Visual Fields are full. Pupils are equal, round, and reactive to light.   III,IV, VI: EOMI without ptosis or diploplia.  V: Facial sensation is symmetric to temperature VII: Facial movement is symmetric.  VIII: hearing is intact to voice X: Uvula elevates symmetrically XI: Shoulder shrug is symmetric. XII: tongue is midline without atrophy or fasciculations.  Motor: Tone is normal. Bulk is normal. 5/5 strength was present in all four extremities.  Sensory: Sensation is symmetric to light touch and temperature in the arms and legs. Cerebellar: FNF and HKS are intact bilaterally Gait:  Deferred in acute setting   NIHSS total 0 Performed at 7:15 AM   I have reviewed labs in epic and the results pertinent to this consultation are:  Basic Metabolic Panel: Recent Labs  Lab 04/24/22 1915  NA 137  K 3.8  CL 99  CO2 31  GLUCOSE 119*  BUN 16  CREATININE 1.11*  CALCIUM 10.4*    CBC: Recent Labs  Lab 04/24/22 1915  WBC 9.5  NEUTROABS 6.0  HGB 13.9  HCT 42.9  MCV 86.0  PLT 273    Coagulation Studies: Recent Labs    04/24/22 1915  LABPROT 13.3  INR 1.0    Lab Results  Component Value Date   CHOL 148 05/10/2021   HDL 51.80 05/10/2021   LDLCALC 84 05/10/2021   TRIG 61.0 05/10/2021   CHOLHDL 3 05/10/2021   Lab Results  Component Value Date   HGBA1C 4.7 05/10/2021     I have reviewed the images obtained:  MRI brain reviewed by myself personally with  patient at bedside 1. Positive for Acute Right MCA territory ischemia. Several small cortical and/or subcortical infarcts involving the lateral posterior right temporal lobe cortex, superior perirolandic area. No associated hemorrhage or mass effect.  2. No other acute intracranial abnormality. Underlying moderate for age cerebral white matter signal changes, most commonly due to chronic small vessel disease.  3. Benign appearing roughly 16 mm cyst of the right face nasolabial junction (series 11, image 2).  CTA head/neck 1. Negative CTA for acute large vessel occlusion. 2. Severe near occlusive stenosis at the right ICA terminus/proximal right M1 segment. 3. Atheromatous change within the carotid siphons with associated moderate to severe  stenoses about the para clinoid segments, right worse than left. 4. Short-segment moderate proximal basilar stenosis, with additional focal severe left P2 stenosis. 5. Diffuse tortuosity of the major arterial vasculature of the neck, suggesting chronic underlying hypertension.  Head CT 1. No CT evidence for acute intracranial abnormality. 2. Mild small vessel ischemic changes of the white matter.  Impression: 72 year old with punctate strokes in the right MCA distribution most likely secondary to significant intracranial atherosclerotic disease, though cardioembolic event remains a possibility.  There is significant room for medical optimization at this time as patient has not even been on antiplatelet previously. She has not had significant recurrent events to date (though there is some white matter change on the MRI brain right greater than left matching her right greater than left atherosclerotic disease).  Therefore I do not think inpatient neurointerventional consultation is needed at this time, but could be considered for recurrent events/symptoms despite maximal medical therapy  Recommendations: -Dual antiplatelet therapy for 90-day course  (unless indication for anticoagulation arises)  ASA 81 mg, Plavix 75 mg daily -Start atorvastatin 20 mg nightly for goal LDL less than 70 -Diet, exercise, weight loss counseling -CPAP counseling -Echocardiogram pending - Telemetry monitoring; 30 day event monitor on discharge if no arrythmias captured  - Blood pressure goal  - Permissive hypertension to 220/120 due to acute stroke for today, then gradually normalize - PT consult, OT consult, Speech consult, not needed as patient is at baseline - Stroke team to follow   [place] [phone] Available 7 AM to 7 PM, outside these hours please contact Neurologist on call listed on AMION

## 2022-04-25 NOTE — Assessment & Plan Note (Signed)
-   We will hold off antihypertensives pending brain MRI without contrast. - Permissive hypertension will be allowed.

## 2022-04-25 NOTE — Progress Notes (Signed)
PT Cancellation Note  Patient Details Name: Barbara Kramer MRN: [id number] DOB: 07-17-1950   Cancelled Treatment:    Reason Eval/Treat Not Completed: Fatigue limiting ability to participate. Pt sleeping soundly and not easily aroused. Will defer balance assessment at this time and reattempt when pt more alert.    [place]  Office [phone]    Rexanne Mano 04/25/2022, 9:16 AM

## 2022-04-26 ENCOUNTER — Observation Stay (HOSPITAL_BASED_OUTPATIENT_CLINIC_OR_DEPARTMENT_OTHER): Payer: Medicare HMO

## 2022-04-26 DIAGNOSIS — I6529 Occlusion and stenosis of unspecified carotid artery: Secondary | ICD-10-CM | POA: Diagnosis not present

## 2022-04-26 DIAGNOSIS — G459 Transient cerebral ischemic attack, unspecified: Secondary | ICD-10-CM

## 2022-04-26 DIAGNOSIS — I639 Cerebral infarction, unspecified: Secondary | ICD-10-CM | POA: Diagnosis not present

## 2022-04-26 DIAGNOSIS — I10 Essential (primary) hypertension: Secondary | ICD-10-CM | POA: Diagnosis not present

## 2022-04-26 LAB — ECHOCARDIOGRAM COMPLETE
AR max vel: 2.44 cm2
AV Area VTI: 2.38 cm2
AV Area mean vel: 2.25 cm2
AV Mean grad: 5 mmHg
AV Peak grad: 9.1 mmHg
Ao pk vel: 1.51 m/s
Area-P 1/2: 2.99 cm2
Calc EF: 64.3 %
Height: 62 in
MV M vel: 4.77 m/s
MV Peak grad: 90.8 mmHg
S' Lateral: 3.3 cm
Single Plane A2C EF: 64.1 %
Single Plane A4C EF: 63.3 %
Weight: 4532.66 oz

## 2022-04-26 MED ORDER — CLOPIDOGREL BISULFATE 75 MG PO TABS: 75.0000 mg | ORAL_TABLET | Freq: Every day | ORAL | 0 refills | Status: DC

## 2022-04-26 MED ORDER — CHLORTHALIDONE 25 MG PO TABS: 12.5000 mg | ORAL_TABLET | Freq: Every day | ORAL | Status: DC

## 2022-04-26 MED ORDER — ATORVASTATIN CALCIUM 20 MG PO TABS: 20.0000 mg | ORAL_TABLET | Freq: Every day | ORAL | 0 refills | Status: DC

## 2022-04-26 MED ORDER — ASPIRIN 81 MG PO TBEC: 81.0000 mg | DELAYED_RELEASE_TABLET | Freq: Every day | ORAL | 0 refills | Status: DC

## 2022-04-26 NOTE — TOC Transition Note (Signed)
Transition of Care [facility]) - CM/SW Discharge Note   Patient Details  Name: Barbara Kramer MRN: [id number] Date of Birth: [birth year]  [facility] CM/SW Contact:  Pollie Friar, RN Phone Number: 04/26/2022, 1:29 PM   Clinical Narrative:    Pt is discharging home with self care. No f/u per PT/OT.  Pt has transportation home.   Final next level of care: Home/Self Care Barriers to Discharge: No Barriers Identified   Patient Goals and CMS Choice      Discharge Placement                         Discharge Plan and Services Additional resources added to the After Visit Summary for                                       Social Determinants of Health (SDOH) Interventions SDOH Screenings   Food Insecurity: No Food Insecurity (04/25/2022)  Housing: Low Risk  (04/25/2022)  Transportation Needs: No Transportation Needs (04/25/2022)  Utilities: Not At Risk (04/25/2022)  Alcohol Screen: Low Risk  (03/11/2019)  Depression (PHQ2-9): Low Risk  (10/18/2021)  Financial Resource Strain: Low Risk  (03/11/2019)  Physical Activity: Inactive (03/11/2019)  Stress: No Stress Concern Present (03/11/2019)  Tobacco Use: Medium Risk (04/25/2022)     Readmission Risk Interventions     No data to display

## 2022-04-26 NOTE — Progress Notes (Signed)
  Echocardiogram 2D Echocardiogram has been performed.  Lana Fish 04/26/2022, 9:55 AM

## 2022-04-26 NOTE — Care Management Obs Status (Signed)
[place] NOTIFICATION   Patient Details  Name: KLEA NALL MRN: [id number] Date of Birth: 09-Apr-1950   Medicare Observation Status Notification Given:  Yes    Pollie Friar, RN 04/26/2022, 1:24 PM

## 2022-04-26 NOTE — Progress Notes (Addendum)
STROKE TEAM PROGRESS NOTE   INTERVAL HISTORY No family at the bedside.  Doing well this morning. No issues, no weakness.  Echo completed this am and neg.  Ok to discharge.  DAPT for 3 weeks then aspirin alone  F/u with stroke clinic outpt in 8 weeks [facility].   Vitals:   04/25/22 1953 04/25/22 2339 04/26/22 0316 04/26/22 0900  BP: (!) 145/62 132/65 (!) 151/76 (!) 150/71  Pulse: 73 78 87 69  Resp: '16 16 14 16  '$ Temp: 98.1 F (36.7 C) 98.4 F (36.9 C) 98.3 F (36.8 C) 97.8 F (36.6 C)  TempSrc: Oral  Oral Oral  SpO2: 100% 100% 100% 100%  Weight:      Height:       CBC:  Recent Labs  Lab 04/24/22 1915 04/25/22 0702  WBC 9.5 8.7  NEUTROABS 6.0  --   HGB 13.9 12.5  HCT 42.9 38.5  MCV 86.0 85.9  PLT 273 161    Basic Metabolic Panel:  Recent Labs  Lab 04/24/22 1915 04/25/22 0702  NA 137 136  K 3.8 3.6  CL 99 103  CO2 31 25  GLUCOSE 119* 90  BUN 16 11  CREATININE 1.11* 0.92  CALCIUM 10.4* 9.2    Lipid Panel:  Recent Labs  Lab 04/25/22 0702  CHOL 137  TRIG 52  HDL 47  CHOLHDL 2.9  VLDL 10  LDLCALC 80    HgbA1c:  Recent Labs  Lab 04/25/22 0702  HGBA1C 4.7*   Urine Drug Screen: No results for input(s): "LABOPIA", "COCAINSCRNUR", "LABBENZ", "AMPHETMU", "THCU", "LABBARB" in the last 168 hours.  Alcohol Level  Recent Labs  Lab 04/24/22 1915  ETH <10     IMAGING past 24 hours ECHOCARDIOGRAM COMPLETE  Result Date: 04/26/2022    ECHOCARDIOGRAM REPORT   Patient Name:   Barbara Kramer Date of Exam: 04/26/2022 Medical Rec #:  [id number]          Height:       62.0 in Accession #:    [id number]         Weight:       283.3 lb Date of Birth:  1951/01/13          BSA:          2.217 m Patient Age:    72 years           BP:           151/76 mmHg Patient Gender: F                  HR:           70 bpm. Exam Location:  Inpatient Procedure: 2D Echo Indications:    stroke  History:        Patient has prior history of Echocardiogram examinations, most                  recent 05/21/2020. TIA; Risk Factors:Hypertension.  Sonographer:    Harvie Junior Referring Phys: [id number] [place]  Sonographer Comments: Patient is obese. Image acquisition challenging due to patient body habitus. IMPRESSIONS  1. Left ventricular ejection fraction, by estimation, is 60 to 65%. The left ventricle has normal function. The left ventricle has no regional wall motion abnormalities. There is mild left ventricular hypertrophy. Left ventricular diastolic parameters are consistent with Grade I diastolic dysfunction (impaired relaxation).  2. Right ventricular systolic function is normal. The right ventricular size is normal. There is normal pulmonary artery systolic  pressure. The estimated right ventricular systolic pressure is 16.1 mmHg.  3. The mitral valve is normal in structure. Trivial mitral valve regurgitation.  4. The aortic valve is tricuspid. Aortic valve regurgitation is not visualized. No aortic stenosis is present.  5. The inferior vena cava is normal in size with greater than 50% respiratory variability, suggesting right atrial pressure of 3 mmHg. FINDINGS  Left Ventricle: Left ventricular ejection fraction, by estimation, is 60 to 65%. The left ventricle has normal function. The left ventricle has no regional wall motion abnormalities. The left ventricular internal cavity size was normal in size. There is  mild left ventricular hypertrophy. Left ventricular diastolic parameters are consistent with Grade I diastolic dysfunction (impaired relaxation). Right Ventricle: The right ventricular size is normal. No increase in right ventricular wall thickness. Right ventricular systolic function is normal. There is normal pulmonary artery systolic pressure. The tricuspid regurgitant velocity is 2.53 m/s, and  with an assumed right atrial pressure of 3 mmHg, the estimated right ventricular systolic pressure is 09.6 mmHg. Left Atrium: Left atrial size was normal in size. Right Atrium: Right atrial size  was normal in size. Pericardium: There is no evidence of pericardial effusion. Mitral Valve: The mitral valve is normal in structure. Trivial mitral valve regurgitation. Tricuspid Valve: The tricuspid valve is normal in structure. Tricuspid valve regurgitation is trivial. Aortic Valve: The aortic valve is tricuspid. Aortic valve regurgitation is not visualized. No aortic stenosis is present. Aortic valve mean gradient measures 5.0 mmHg. Aortic valve peak gradient measures 9.1 mmHg. Aortic valve area, by VTI measures 2.38 cm. Pulmonic Valve: The pulmonic valve was not well visualized. Pulmonic valve regurgitation is trivial. Aorta: The aortic root is normal in size and structure. Venous: The inferior vena cava is normal in size with greater than 50% respiratory variability, suggesting right atrial pressure of 3 mmHg. IAS/Shunts: The interatrial septum was not well visualized.  LEFT VENTRICLE PLAX 2D LVIDd:         4.70 cm      Diastology LVIDs:         3.30 cm      LV e' medial:    6.20 cm/s LV PW:         0.90 cm      LV E/e' medial:  10.9 LV IVS:        1.10 cm      LV e' lateral:   8.59 cm/s LVOT diam:     1.90 cm      LV E/e' lateral: 7.9 LV SV:         78 LV SV Index:   35 LVOT Area:     2.84 cm                              3D Volume EF: LV Volumes (MOD)            3D EF:        64 % LV vol d, MOD A2C: 113.0 ml LV EDV:       163 ml LV vol d, MOD A4C: 118.0 ml LV ESV:       58 ml LV vol s, MOD A2C: 40.6 ml  LV SV:        105 ml LV vol s, MOD A4C: 43.3 ml LV SV MOD A2C:     72.4 ml LV SV MOD A4C:     118.0 ml LV SV MOD BP:  76.7 ml RIGHT VENTRICLE RV Basal diam:  3.00 cm RV Mid diam:    2.40 cm RV S prime:     16.50 cm/s TAPSE (M-mode): 3.0 cm LEFT ATRIUM             Index        RIGHT ATRIUM           Index LA diam:        3.70 cm 1.67 cm/m   RA Area:     12.30 cm LA Vol (A2C):   40.9 ml 18.45 ml/m  RA Volume:   25.40 ml  11.46 ml/m LA Vol (A4C):   45.9 ml 20.71 ml/m LA Biplane Vol: 44.2 ml 19.94 ml/m   AORTIC VALVE                     PULMONIC VALVE AV Area (Vmax):    2.44 cm      PV Vmax:          0.98 m/s AV Area (Vmean):   2.25 cm      PV Peak grad:     3.8 mmHg AV Area (VTI):     2.38 cm      PR End Diast Vel: 4.24 msec AV Vmax:           151.00 cm/s AV Vmean:          104.000 cm/s AV VTI:            0.327 m AV Peak Grad:      9.1 mmHg AV Mean Grad:      5.0 mmHg LVOT Vmax:         130.00 cm/s LVOT Vmean:        82.700 cm/s LVOT VTI:          0.275 m LVOT/AV VTI ratio: 0.84  AORTA Ao Root diam: 2.90 cm Ao Asc diam:  2.90 cm MITRAL VALVE               TRICUSPID VALVE MV Area (PHT): 2.99 cm    TR Peak grad:   25.6 mmHg MV Decel Time: 254 msec    TR Vmax:        253.00 cm/s MR Peak grad: 90.8 mmHg MR Vmax:      476.50 cm/s  SHUNTS MV E velocity: 67.70 cm/s  Systemic VTI:  0.28 m                            Systemic Diam: 1.90 cm Barbara Milian MD Electronically signed by Barbara Milian MD Signature Date/Time: 04/26/2022/11:23:00 AM    Final     PHYSICAL EXAM  Physical Exam  Constitutional: Appears well-developed and well-nourished.   Cardiovascular: Normal rate and regular rhythm.  Respiratory: Effort normal, non-labored breathing  Neuro: Mental Status: Patient is awake, alert, oriented to person, place, month, year, and situation. Patient is able to give a clear and coherent history. No signs of aphasia or neglect Cranial Nerves: II: Visual Fields are full. Pupils are equal, round, and reactive to light.   III,IV, VI: EOMI without ptosis or diploplia.  V: Facial sensation is symmetric to temperature VII: Facial movement is symmetric resting and smiling VIII: Hearing is intact to voice X: Palate elevates symmetrically XI: Shoulder shrug is symmetric. XII: Tongue protrudes midline without atrophy or fasciculations.  Motor: Tone is normal. Bulk is normal. 5/5 strength was present in all four extremities.  Sensory: Sensation is symmetric to light touch  Cerebellar: FNF and  HKS are intact bilaterally Gait is steady   ASSESSMENT/PLAN Ms. MADILYNNE MULLAN is a 72 y.o. female with history of hypertension, sleep apnea not on CPAP, prediabetes, osteoarthritis of the knees and shoulders, BMI 51.81, remote smoking presenting with left facial droop.  Punctate strokes in the right MCA distribution most likely secondary to significant intracranial atherosclerotic disease.  Stroke:  Punctate strokes in the right MCA territory Etiology:  significant intracranial atherosclerotic disease Code Stroke CT head No acute abnormality.  CTA head & neck Severe near occlusive stenosis at the right ICA terminus/proximal right M1 segment. Atheromatous change within the carotid siphons with associated moderate to severe stenoses about the para clinoid segments, right worse than left. Short-segment moderate proximal basilar stenosis, with additional focal severe left P2 stenosis. Diffuse tortuosity of the major arterial vasculature of the neck, suggesting chronic underlying hypertension. MRI  Positive for Acute Right MCA territory ischemia. Several small cortical and/or subcortical infarcts involving the lateral posterior right temporal lobe cortex, superior perirolandic area 2D Echo EF 60-65% LDL 80 HgbA1c 4.7 VTE prophylaxis - lovenox    Diet   Diet Heart Room service appropriate? Yes; Fluid consistency: Thin   No antithrombotic prior to admission, now on aspirin 81 mg daily and clopidogrel 75 mg daily.  Therapy recommendations: No follow up Disposition:  pending  Hypertension Home meds:  chlorthalidone- does not take consistently Stable Permissive hypertension (OK if < 220/120) but gradually normalize in 5-7 days Long-term BP goal normotensive Agreeable to log blood pressures for PCP  Hyperlipidemia LDL 80, goal < 70 Add Atorvastatin '20mg'$   Continue statin at discharge  Other Stroke Risk Factors Advanced Age >/= 19  Obesity, Body mass index is 51.81 kg/m., BMI >/= 30  associated with increased stroke risk, recommend weight loss, diet and exercise as appropriate   Other Active Problems Caregiver burnout- Her brother in law and her are looking for facilities to assist in caring for her sister who has alzheimers  Hospital day # 0   Webber Michiels,MD    To contact Stroke Continuity provider, please refer to [email or web address] After hours, contact General Neurology

## 2022-04-26 NOTE — Discharge Summary (Signed)
Physician Discharge Summary  Barbara Kramer OIN:[id number] DOB: [birth year] DOA: 04/24/2022  PCP: Barbara Greenspan, MD  Admit date: 04/24/2022 Discharge date: 04/26/2022  Admitted From: Home Disposition: Home  Recommendations for Outpatient Follow-up:  Follow up with PCP in 1 week with  Outpatient follow-up with neurology  follow up in ED if symptoms worsen or new appear   Home Health: No Equipment/Devices: None  Discharge Condition: Stable CODE STATUS: Full Diet recommendation: Heart healthy  Brief/Interim Summary: 72 year old female with history of essential hypertension, obstructive sleep apnea currently not on CPAP, osteoarthritis presented with left facial droop.  On presentation, she was hypertensive.  Noncontrast CT head was negative for any acute intracranial abnormality.  CTA of the head and neck showed no acute large vessel occlusion but showed severe near occlusive stenosis at right ICA terminus/proximal right M1 send.  MRI brain was positive for acute right MCA territory ischemia and several small cortical and/or subcortical infarcts involving the lateral posterior right temporal lobe cortex, superior perirolandic area.  Neurology was consulted.  Patient has been started on aspirin, Plavix and statin.  Patient has tolerated physical therapy.  Neurology has cleared the patient for discharge.  Discharge patient home today with close outpatient follow-up with neurology.  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  CTA of the head and neck showed no acute large vessel occlusion but showed severe near occlusive stenosis at right ICA terminus/proximal right M1 send.  MRI brain was positive for acute right MCA territory ischemia and several small cortical  and/or subcortical infarcts involving the lateral posterior right temporal lobe cortex, superior perirolandic area.  Neurology was consulted.  Patient has been started on aspirin, Plavix and statin.  Neurology recommended aspirin and Plavix for 3 weeks then aspirin alone.  Patient has tolerated physical therapy.  Neurology has cleared the patient for discharge. Discharge patient home today with close outpatient follow-up with neurology. -LDL 80: Has been started on Lipitor.  A1c 4.7.  Echo showed EF of 60 to 65% with grade 1 diastolic dysfunction -Outpatient follow-up with neurology  Hypertension -Chlorthalidone on hold: Does not take consistently.  Allowing for permissive hypertension.  Resume chlorthalidone in 5 to 7 days upon discharge with outpatient follow-up with PCP  Morbid obesity -Outpatient follow-up  Discharge Instructions  Allergies as of 04/26/2022       Reactions   Calcium Carbonate Other (See Comments)   constipation   Penicillins Rash   Has patient had a PCN reaction causing immediate rash, facial/tongue/throat swelling, SOB or lightheadedness with hypotension: no Has patient had a PCN reaction causing severe rash involving mucus membranes or skin necrosis: no Has patient had a PCN reaction that required hospitalization: no Has patient had a PCN reaction occurring within the last 10 years: no If all of the above answers are "NO", then may proceed with Cephalosporin use.        Medication List     STOP taking these medications    triamcinolone cream 0.1 % Commonly known as: KENALOG       TAKE these medications    aspirin EC 81 MG tablet Take 1 tablet (81 mg total) by mouth daily. Swallow whole. Start taking on: April 27, 2022   atorvastatin 20 MG tablet Commonly known as: LIPITOR Take 1 tablet (20 mg total) by mouth  daily. Start taking on: April 27, 2022   chlorthalidone 25 MG tablet Commonly known as: HYGROTON Take 0.5 tablets (12.5 mg total) by  mouth daily. Start taking on: May 01, 2022 What changed: These instructions start on May 01, 2022. If you are unsure what to do until then, ask your doctor or other care provider.   clopidogrel 75 MG tablet Commonly known as: PLAVIX Take 1 tablet (75 mg total) by mouth daily. Start taking on: April 27, 2022   fluticasone 50 MCG/ACT nasal spray Commonly known as: FLONASE Place 2 sprays into both nostrils as needed for allergies or rhinitis.         Allergies  Allergen Reactions   Calcium Carbonate Other (See Comments)    constipation   Penicillins Rash    Has patient had a PCN reaction causing immediate rash, facial/tongue/throat swelling, SOB or lightheadedness with hypotension: no Has patient had a PCN reaction causing severe rash involving mucus membranes or skin necrosis: no Has patient had a PCN reaction that required hospitalization: no Has patient had a PCN reaction occurring within the last 10 years: no If all of the above answers are "NO", then may proceed with Cephalosporin use.     Consultations: Neurology   Procedures/Studies: ECHOCARDIOGRAM COMPLETE  Result Date: 04/26/2022    ECHOCARDIOGRAM REPORT   Patient Name:   Barbara Kramer Date of Exam: 04/26/2022 Medical Rec #:  [id number]          Height:       62.0 in Accession #:    [id number]         Weight:       283.3 lb Date of Birth:  1951-02-20          BSA:          2.217 m Patient Age:    71 years           BP:           151/76 mmHg Patient Gender: F                  HR:           70 bpm. Exam Location:  Inpatient Procedure: 2D Echo Indications:    stroke  History:        Patient has prior history of Echocardiogram examinations, most                 recent 05/21/2020. TIA; Risk Factors:Hypertension.  Sonographer:    Harvie Junior Referring Phys: [id number] [place]  Sonographer Comments: Patient is obese. Image acquisition challenging due to patient body habitus. IMPRESSIONS  1. Left ventricular ejection  fraction, by estimation, is 60 to 65%. The left ventricle has normal function. The left ventricle has no regional wall motion abnormalities. There is mild left ventricular hypertrophy. Left ventricular diastolic parameters are consistent with Grade I diastolic dysfunction (impaired relaxation).  2. Right ventricular systolic function is normal. The right ventricular size is normal. There is normal pulmonary artery systolic pressure. The estimated right ventricular systolic pressure is 64.3 mmHg.  3. The mitral valve is normal in structure. Trivial mitral valve regurgitation.  4. The aortic valve is tricuspid. Aortic valve regurgitation is not visualized. No aortic stenosis is present.  5. The inferior vena cava is normal in size with greater than 50% respiratory variability, suggesting right atrial pressure of 3 mmHg. FINDINGS  Left Ventricle: Left ventricular ejection fraction, by estimation, is 60 to 65%. The left ventricle has  normal function. The left ventricle has no regional wall motion abnormalities. The left ventricular internal cavity size was normal in size. There is  mild left ventricular hypertrophy. Left ventricular diastolic parameters are consistent with Grade I diastolic dysfunction (impaired relaxation). Right Ventricle: The right ventricular size is normal. No increase in right ventricular wall thickness. Right ventricular systolic function is normal. There is normal pulmonary artery systolic pressure. The tricuspid regurgitant velocity is 2.53 m/s, and  with an assumed right atrial pressure of 3 mmHg, the estimated right ventricular systolic pressure is 05.3 mmHg. Left Atrium: Left atrial size was normal in size. Right Atrium: Right atrial size was normal in size. Pericardium: There is no evidence of pericardial effusion. Mitral Valve: The mitral valve is normal in structure. Trivial mitral valve regurgitation. Tricuspid Valve: The tricuspid valve is normal in structure. Tricuspid valve  regurgitation is trivial. Aortic Valve: The aortic valve is tricuspid. Aortic valve regurgitation is not visualized. No aortic stenosis is present. Aortic valve mean gradient measures 5.0 mmHg. Aortic valve peak gradient measures 9.1 mmHg. Aortic valve area, by VTI measures 2.38 cm. Pulmonic Valve: The pulmonic valve was not well visualized. Pulmonic valve regurgitation is trivial. Aorta: The aortic root is normal in size and structure. Venous: The inferior vena cava is normal in size with greater than 50% respiratory variability, suggesting right atrial pressure of 3 mmHg. IAS/Shunts: The interatrial septum was not well visualized.  LEFT VENTRICLE PLAX 2D LVIDd:         4.70 cm      Diastology LVIDs:         3.30 cm      LV e' medial:    6.20 cm/s LV PW:         0.90 cm      LV E/e' medial:  10.9 LV IVS:        1.10 cm      LV e' lateral:   8.59 cm/s LVOT diam:     1.90 cm      LV E/e' lateral: 7.9 LV SV:         78 LV SV Index:   35 LVOT Area:     2.84 cm                              3D Volume EF: LV Volumes (MOD)            3D EF:        64 % LV vol d, MOD A2C: 113.0 ml LV EDV:       163 ml LV vol d, MOD A4C: 118.0 ml LV ESV:       58 ml LV vol s, MOD A2C: 40.6 ml  LV SV:        105 ml LV vol s, MOD A4C: 43.3 ml LV SV MOD A2C:     72.4 ml LV SV MOD A4C:     118.0 ml LV SV MOD BP:      76.7 ml RIGHT VENTRICLE RV Basal diam:  3.00 cm RV Mid diam:    2.40 cm RV S prime:     16.50 cm/s TAPSE (M-mode): 3.0 cm LEFT ATRIUM             Index        RIGHT ATRIUM           Index LA diam:        3.70 cm  1.67 cm/m   RA Area:     12.30 cm LA Vol (A2C):   40.9 ml 18.45 ml/m  RA Volume:   25.40 ml  11.46 ml/m LA Vol (A4C):   45.9 ml 20.71 ml/m LA Biplane Vol: 44.2 ml 19.94 ml/m  AORTIC VALVE                     PULMONIC VALVE AV Area (Vmax):    2.44 cm      PV Vmax:          0.98 m/s AV Area (Vmean):   2.25 cm      PV Peak grad:     3.8 mmHg AV Area (VTI):     2.38 cm      PR End Diast Vel: 4.24 msec AV Vmax:            151.00 cm/s AV Vmean:          104.000 cm/s AV VTI:            0.327 m AV Peak Grad:      9.1 mmHg AV Mean Grad:      5.0 mmHg LVOT Vmax:         130.00 cm/s LVOT Vmean:        82.700 cm/s LVOT VTI:          0.275 m LVOT/AV VTI ratio: 0.84  AORTA Ao Root diam: 2.90 cm Ao Asc diam:  2.90 cm MITRAL VALVE               TRICUSPID VALVE MV Area (PHT): 2.99 cm    TR Peak grad:   25.6 mmHg MV Decel Time: 254 msec    TR Vmax:        253.00 cm/s MR Peak grad: 90.8 mmHg MR Vmax:      476.50 cm/s  SHUNTS MV E velocity: 67.70 cm/s  Systemic VTI:  0.28 m                            Systemic Diam: 1.90 cm Oswaldo Milian MD Electronically signed by Oswaldo Milian MD Signature Date/Time: 04/26/2022/11:23:00 AM    Final    MR BRAIN WO CONTRAST  Result Date: 04/25/2022 CLINICAL DATA:  72 year old female with headache, TIA. Near occlusion of the right ICA terminus, proximal right MCA. EXAM: MRI HEAD WITHOUT CONTRAST TECHNIQUE: Multiplanar, multiecho pulse sequences of the brain and surrounding structures were obtained without intravenous contrast. COMPARISON:  CT head and CTA head and neck yesterday. FINDINGS: Brain: Small round 3-4 mm focus of diffusion restriction in the ventral right motor gyrus on series 5, image 96, near upper extremity representation area. Similar small postcentral right parietal focus of restricted diffusion on series 5, image 94., and confluent roughly 1.5 cm area of cortical restricted diffusion along the posterior right temporal lobe best seen on series 5, images 73-75. Subtle associated T2 and FLAIR hyperintense cytotoxic edema. No convincing hemorrhage. No contralateral left hemisphere or posterior fossa restricted diffusion. No midline shift, mass effect, evidence of mass lesion, ventriculomegaly, extra-axial collection or acute intracranial hemorrhage. Cervicomedullary junction and pituitary are within normal limits. Scattered and patchy bilateral white matter T2 and FLAIR hyperintensity,  moderate for age and slightly greater on the right side. No chronic cortical encephalomalacia or definite chronic cerebral blood products. Deep gray matter nuclei, brainstem and cerebellum are negative. Vascular: Major intracranial vascular flow voids are preserved. Skull and upper  cervical spine: Partially visible cervical spine disc and endplate degeneration. Background bone marrow signal within normal limits. Sinuses/Orbits: Negative orbits. Maxillary alveolar recess mucosal thickening is mild. Circumscribed 13-16 mm cyst of the right nasal labial fold on series 10, image 3 and series 11, image 2 has a benign appearance. Other: Mastoids are well aerated. Grossly normal visible internal auditory structures. Visualized scalp soft tissues are within normal limits. IMPRESSION: 1. Positive for Acute Right MCA territory ischemia. Several small cortical and/or subcortical infarcts involving the lateral posterior right temporal lobe cortex, superior perirolandic area. No associated hemorrhage or mass effect. 2. No other acute intracranial abnormality. Underlying moderate for age cerebral white matter signal changes, most commonly due to chronic small vessel disease. 3. Benign appearing roughly 16 mm cyst of the right face nasolabial junction (series 11, image 2). Electronically Signed   By: Genevie Ann M.D.   On: 04/25/2022 06:17   CT ANGIO HEAD NECK W WO CM  Result Date: 04/24/2022 CLINICAL DATA:  Initial evaluation for acute TIA. EXAM: CT ANGIOGRAPHY HEAD AND NECK TECHNIQUE: Multidetector CT imaging of the head and neck was performed using the standard protocol during bolus administration of intravenous contrast. Multiplanar CT image reconstructions and MIPs were obtained to evaluate the vascular anatomy. Carotid stenosis measurements (when applicable) are obtained utilizing NASCET criteria, using the distal internal carotid diameter as the denominator. RADIATION DOSE REDUCTION: This exam was performed according to the  departmental dose-optimization program which includes automated exposure control, adjustment of the mA and/or kV according to patient size and/or use of iterative reconstruction technique. CONTRAST:  70m OMNIPAQUE IOHEXOL 350 MG/ML SOLN COMPARISON:  Prior CT from earlier the same day. FINDINGS: CTA NECK FINDINGS Aortic arch: Visualized aortic arch normal caliber with sooner branch pattern. Mild atheromatous change about the arch itself. No stenosis about the origin the great vessels. Right carotid system: Right common and internal carotid arteries are tortuous without dissection or stenosis. Left carotid system: Left common and internal carotid arteries are tortuous without dissection or stenosis. Vertebral arteries: Both vertebral arteries arise from subclavian arteries. No proximal subclavian artery stenosis. Vertebral arteries are patent without dissection or stenosis. Skeleton: Moderate to advanced degenerative spondylosis throughout the visualized cervicothoracic spine. No worrisome osseous lesions. Other neck: No other acute soft tissue abnormality in neck. Upper chest: Visualized upper chest demonstrates no acute finding. Review of the MIP images confirms the above findings CTA HEAD FINDINGS Anterior circulation: Atheromatous change within the carotid siphons with associated moderate to severe stenoses about the para clinoid segments, right worse than left. A1 segments patent bilaterally. Normal anterior communicating artery complex. Anterior cerebral arteries patent without stenosis. Left M1 segment and distal left MCA branches are well perfused and patent. On the right, there is a severe near occlusive stenosis at the right ICA terminus/proximal right M1 segment (series 6, image 104). Right M1 segment patent distally. No proximal MCA branch occlusion or high-grade stenosis. Distal right MCA branches well perfused. Posterior circulation: Both V4 segments patent without stenosis. Left PICA patent. Right PICA  not well seen. Short-segment moderate proximal basilar stenosis (series 6, image 120). Basilar otherwise patent to its distal aspect. Superior cerebral arteries patent bilaterally. Both PCAs primarily supplied via the basilar. Focal severe left P2 stenosis (series 7, image 22). PCAs otherwise patent to their distal aspects without significant stenosis. Venous sinuses: Grossly patent allowing for timing the contrast bolus. Anatomic variants: None significant.  No aneurysm. Review of the MIP images confirms the above findings IMPRESSION: 1.  Negative CTA for acute large vessel occlusion. 2. Severe near occlusive stenosis at the right ICA terminus/proximal right M1 segment. 3. Atheromatous change within the carotid siphons with associated moderate to severe stenoses about the para clinoid segments, right worse than left. 4. Short-segment moderate proximal basilar stenosis, with additional focal severe left P2 stenosis. 5. Diffuse tortuosity of the major arterial vasculature of the neck, suggesting chronic underlying hypertension. Electronically Signed   By: Jeannine Boga M.D.   On: 04/24/2022 23:55   CT HEAD WO CONTRAST  Result Date: 04/24/2022 CLINICAL DATA:  Headaches, twisting of the mouth EXAM: CT HEAD WITHOUT CONTRAST TECHNIQUE: Contiguous axial images were obtained from the base of the skull through the vertex without intravenous contrast. RADIATION DOSE REDUCTION: This exam was performed according to the departmental dose-optimization program which includes automated exposure control, adjustment of the mA and/or kV according to patient size and/or use of iterative reconstruction technique. COMPARISON:  None Available. FINDINGS: Brain: No acute territorial infarction, hemorrhage or intracranial mass. Mild white matter hypodensity. The ventricles are nonenlarged. Vascular: No hyperdense vessels.  Carotid vascular calcification Skull: Normal. Negative for fracture or focal lesion. Sinuses/Orbits: No acute  finding. Other: None IMPRESSION: 1. No CT evidence for acute intracranial abnormality. 2. Mild small vessel ischemic changes of the white matter. Electronically Signed   By: Donavan Foil M.D.   On: 04/24/2022 19:24      Subjective: Patient seen and examined at bedside.  Feels that her symptoms are improving.  Feels better to go home today.  No fever, vomiting, chest pain reported.  Discharge Exam: Vitals:   04/26/22 0316 04/26/22 0900  BP: (!) 151/76 (!) 150/71  Pulse: 87 69  Resp: 14 16  Temp: 98.3 F (36.8 C) 97.8 F (36.6 C)  SpO2: 100% 100%    General: Pt is alert, awake, not in acute distress.  On room air. Cardiovascular: rate controlled, S1/S2 + Respiratory: bilateral decreased breath sounds at bases Abdominal: Soft, morbidly obese, NT, ND, bowel sounds + Extremities: Trace lower extremity edema; no cyanosis    The results of significant diagnostics from this hospitalization (including imaging, microbiology, ancillary and laboratory) are listed below for reference.     Microbiology: No results found for this or any previous visit (from the past 240 hour(s)).   Labs: BNP (last 3 results) No results for input(s): "BNP" in the last 8760 hours. Basic Metabolic Panel: Recent Labs  Lab 04/24/22 1915 04/25/22 0702  NA 137 136  K 3.8 3.6  CL 99 103  CO2 31 25  GLUCOSE 119* 90  BUN 16 11  CREATININE 1.11* 0.92  CALCIUM 10.4* 9.2   Liver Function Tests: Recent Labs  Lab 04/24/22 1915  AST 14*  ALT 11  ALKPHOS 55  BILITOT 0.3  PROT 8.1  ALBUMIN 4.4   No results for input(s): "LIPASE", "AMYLASE" in the last 168 hours. No results for input(s): "AMMONIA" in the last 168 hours. CBC: Recent Labs  Lab 04/24/22 1915 04/25/22 0702  WBC 9.5 8.7  NEUTROABS 6.0  --   HGB 13.9 12.5  HCT 42.9 38.5  MCV 86.0 85.9  PLT 273 237   Cardiac Enzymes: No results for input(s): "CKTOTAL", "CKMB", "CKMBINDEX", "TROPONINI" in the last 168 hours. BNP: Invalid  input(s): "POCBNP" CBG: Recent Labs  Lab 04/24/22 2008  GLUCAP 125*   D-Dimer No results for input(s): "DDIMER" in the last 72 hours. Hgb A1c Recent Labs    04/25/22 0702  HGBA1C 4.7*   Lipid  Profile Recent Labs    04/25/22 0702  CHOL 137  HDL 47  LDLCALC 80  TRIG 52  CHOLHDL 2.9   Thyroid function studies No results for input(s): "TSH", "T4TOTAL", "T3FREE", "THYROIDAB" in the last 72 hours.  Invalid input(s): "FREET3" Anemia work up No results for input(s): "VITAMINB12", "FOLATE", "FERRITIN", "TIBC", "IRON", "RETICCTPCT" in the last 72 hours. Urinalysis    Component Value Date/Time   COLORURINE YELLOW 10/24/2020 2043   APPEARANCEUR HAZY (A) 10/24/2020 2043   LABSPEC 1.018 10/24/2020 2043   PHURINE 5.0 10/24/2020 2043   GLUCOSEU NEGATIVE 10/24/2020 2043   HGBUR NEGATIVE 10/24/2020 2043   HGBUR negative 12/14/2008 1606   BILIRUBINUR NEGATIVE 10/24/2020 2043   BILIRUBINUR Neg. 10/14/2014 [place] 10/24/2020 2043   PROTEINUR NEGATIVE 10/24/2020 2043   UROBILINOGEN 0.2 10/14/2014 1214   UROBILINOGEN 1.0 05/10/2012 0415   NITRITE NEGATIVE 10/24/2020 2043   LEUKOCYTESUR NEGATIVE 10/24/2020 2043   Sepsis Labs Recent Labs  Lab 04/24/22 1915 04/25/22 0702  WBC 9.5 8.7   Microbiology No results found for this or any previous visit (from the past 240 hour(s)).   Time coordinating discharge: 35 minutes  SIGNED:   Aline August, MD  [facility] 04/26/2022, 11:44 AM

## 2022-04-27 ENCOUNTER — Telehealth: Payer: Self-pay

## 2022-04-27 NOTE — Patient Outreach (Signed)
  [facility] Stroke Care Coordination Follow Up  04/27/2022 Name:  Barbara Kramer MRN:  [id number] DOB:  1950-04-27  Subjective: Barbara Kramer is a 72 y.o. year old female who is a primary care patient of Tower, Wynelle Fanny, MD   Patient returned automated call requesting to speak with a nurse. Outreach call to patient. She voices she rested well last night and had a good dinner and breakfast. She denies any acute issues or concerns at present. Patient inquiring about why her BP is being held until Feb 12th. Reviewed note from MD: "-Chlorthalidone on hold: Does not take consistently.  Allowing for permissive hypertension.  Resume chlorthalidone in 5 to 7 days upon discharge with outpatient follow-up with PCP." Patient voiced understanding. Patient has BP machine in the home but does not check BP regularly. Encouraged to start monitoring BP at least once a day and record values to take to MD appts. She has PCP appt on 05/03/22. She will call and make neuro appt. No further RN CM needs or concerns at this time.    Care Coordination Interventions:  Yes, provided   Follow up plan: Advised patient that they would continue to get automated EMMI-Stroke post discharge calls to assess how they are doing following recent hospitalization and will receive a call from a nurse if any of their responses were abnormal. Patient voiced understanding and was appreciative of f/u call.   Encounter Outcome:  Pt. Visit Completed   Enzo Montgomery, RN,BSN,CCM [facility] Telephonic Care Management Coordinator Direct Phone: [phone] Toll Free: [phone] Fax: [phone]

## 2022-05-03 ENCOUNTER — Ambulatory Visit (INDEPENDENT_AMBULATORY_CARE_PROVIDER_SITE_OTHER): Payer: Medicare HMO | Admitting: Family Medicine

## 2022-05-03 ENCOUNTER — Encounter: Payer: Self-pay | Admitting: Family Medicine

## 2022-05-03 VITALS — BP 138/70 | HR 81 | Temp 97.7°F | Ht 62.0 in | Wt 283.4 lb

## 2022-05-03 DIAGNOSIS — G4733 Obstructive sleep apnea (adult) (pediatric): Secondary | ICD-10-CM | POA: Diagnosis not present

## 2022-05-03 DIAGNOSIS — G459 Transient cerebral ischemic attack, unspecified: Secondary | ICD-10-CM

## 2022-05-03 DIAGNOSIS — Z6841 Body Mass Index (BMI) 40.0 and over, adult: Secondary | ICD-10-CM | POA: Diagnosis not present

## 2022-05-03 DIAGNOSIS — I10 Essential (primary) hypertension: Secondary | ICD-10-CM | POA: Diagnosis not present

## 2022-05-03 DIAGNOSIS — E7800 Pure hypercholesterolemia, unspecified: Secondary | ICD-10-CM

## 2022-05-03 DIAGNOSIS — E785 Hyperlipidemia, unspecified: Secondary | ICD-10-CM | POA: Insufficient documentation

## 2022-05-03 DIAGNOSIS — R7303 Prediabetes: Secondary | ICD-10-CM | POA: Diagnosis not present

## 2022-05-03 DIAGNOSIS — I6529 Occlusion and stenosis of unspecified carotid artery: Secondary | ICD-10-CM | POA: Diagnosis not present

## 2022-05-03 MED ORDER — CHLORTHALIDONE 25 MG PO TABS: 12.5000 mg | ORAL_TABLET | Freq: Every day | ORAL | 3 refills | Status: DC

## 2022-05-03 NOTE — Assessment & Plan Note (Signed)
Mild in setting of TIA recent with carotid stenosis   Last LDL of 80 Goal under 70  Disc diet  Disc goals for lipids and reasons to control them Rev last labs with pt Rev low sat fat diet in detail  Was started on atorvastatin 20 mg daily in hospital  Tolerating well Labs later this mo

## 2022-05-03 NOTE — Assessment & Plan Note (Signed)
Now getting back on chlortalidone s/p cva  Re started at 7 day point yesterday BP: 138/70  Will monitor closely  F/u planned in about 2 weeks for visit and labs

## 2022-05-03 NOTE — Assessment & Plan Note (Addendum)
Near occlusive stenosis of R ICA on imaging (CTA) in hospital   With CVA in R MCA territory    Will continue both plavix and asa for 90 days then likely change to asa alone per neurology  Bp control  Aggressive cholesterol control Will likely medically manage-but her neurologist may also touch base with vascular

## 2022-05-03 NOTE — Assessment & Plan Note (Signed)
Enc wt loss with low glycemic diet  Exercise when recovered from TIA

## 2022-05-03 NOTE — Assessment & Plan Note (Signed)
Lab Results  Component Value Date   HGBA1C 4.7 (L) 04/25/2022   disc imp of low glycemic diet and wt loss to prevent DM2

## 2022-05-03 NOTE — Patient Instructions (Addendum)
Follow up with neurology in April as planned   I placed a pulmonary referral for [facility] to discuss sleep apnea  If you don't get a call in 1-2 weeks let me know   Stay on your current medicine and continue the chlorthalidone as you are taking it    For weight loss/ cholesterol control and blood pressure control   Try to get most of your carbohydrates from produce (with the exception of white potatoes)  Eat less bread/pasta/rice/snack foods/cereals/sweets and other items from the middle of the grocery store (processed carbs) (Less processed foods means less sodium)   Avoid red meat/ fried foods/ egg yolks/ fatty breakfast meats/ butter, cheese and high fat dairy/ and shellfish    Follow up end of month as planned with labs prior

## 2022-05-03 NOTE — Assessment & Plan Note (Signed)
In setting of recent stroke- important to treat  Having mask /fitting issues and has not been wearing  Will ref to pulmonary for f/u of this

## 2022-05-03 NOTE — Assessment & Plan Note (Addendum)
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

## 2022-05-03 NOTE — Progress Notes (Addendum)
Subjective:    Patient ID: Barbara Kramer, female    DOB: 1950/09/18, 72 y.o.   MRN: [id number]  HPI Pt presents for hospital follow up for CVA  Wt Readings from Last 3 Encounters:  05/03/22 283 lb 6 oz (128.5 kg)  04/25/22 283 lb 4.7 oz (128.5 kg)  01/04/22 288 lb (130.6 kg)   51.83 kg/m   Hosp from 2/5 to 2/7 for CVA Had punctate strokes in R MCA territory = causing L facial droop  (it resolved quickly)  Due to intracranial atherosc dz/ intracranial carotid stenosis - ICA  Saw neuro  Started on asa , plavix and statin == then will be asa alone    Noted once or twice had a fleeting pain in neck- quite a while ago  Last week had a slight headache for 2 d (this sometimes happens after her hair was done)    Echo showed EF of 60-65%  with grade one DD  Planning neuro f/u  Has appt with Dr Leta Baptist in April    She changed her diet  Stopped cooking with salt since her hospitalization  Stopped fried foods     HTN BP Readings from Last 3 Encounters:  05/03/22 (!) 146/72  04/26/22 (!) 150/71  01/04/22 130/64  Chlorthalidone on hold - did not take consistently  Wanted to resume this upon f/u with [place] in 5-7 d  Started it back yesterday   She stopped using cpap due to an issue with her denture /mouth  Needs to get that taken care of    Lab Results  Component Value Date   CHOL 137 04/25/2022   HDL 47 04/25/2022   LDLCALC 80 04/25/2022   TRIG 52 04/25/2022   CHOLHDL 2.9 04/25/2022   Lipitor 20 mg daily    Lab Results  Component Value Date   HGBA1C 4.7 (L) 04/25/2022     Lab Results  Component Value Date   CREATININE 0.92 04/25/2022   BUN 11 04/25/2022   NA 136 04/25/2022   K 3.6 04/25/2022   CL 103 04/25/2022   CO2 25 04/25/2022   Glucose of 90 Lab Results  Component Value Date   WBC 8.7 04/25/2022   HGB 12.5 04/25/2022   HCT 38.5 04/25/2022   MCV 85.9 04/25/2022   PLT 237 04/25/2022   Lab Results  Component Value Date   INR 1.0  04/24/2022   Lab Results  Component Value Date   TSH 2.53 05/10/2021    She wants to be DNR she thinks  Does not want any life prolonging efforts but unsure about chest compressions    Patient Active Problem List   Diagnosis Date Noted   Hyperlipidemia 05/03/2022   Intracranial carotid stenosis 04/25/2022   Facial weakness 04/25/2022   TIA (transient ischemic attack) 04/24/2022   Sciatica of left side 01/04/2022   Insect bite of chest 10/18/2021   Mobility impaired 08/01/2021   Head pain 06/28/2021   Vulvar cyst 01/25/2021   Rash of neck 08/02/2020   Essential hypertension 06/30/2020   Medicare annual wellness visit, subsequent 05/14/2020   Unilateral primary osteoarthritis, left knee 05/10/2020   Morbid obesity with body mass index (BMI) of 50.0 to 59.9 in adult [facility]) 05/10/2020   Family history of colon cancer in mother    Benign neoplasm of colon    Welcome to Medicare preventive visit 01/29/2018   OSA (obstructive sleep apnea) 01/29/2018   Estrogen deficiency 01/29/2018   Allergic rhinitis 05/15/2017   Screening  mammogram, encounter for 10/11/2016   Prediabetes 12/15/2014   Osteoarthritis of left knee 10/16/2013   Colon cancer screening 11/20/2012   Routine gynecological examination 11/17/2011   Other screening mammogram 11/17/2011   Low back pain 10/18/2010   Routine general medical examination at a health care facility 10/06/2010   ACANTHOSIS NIGRICANS 01/27/2008   Eczema 08/02/2006   Past Medical History:  Diagnosis Date   Anemia    Arthritis    Breast lump    left   Eczema    Family hx of colon cancer    Gallbladder problem    Hypertension    Kidney cysts    small, right 10/2002   Normal cardiac stress test 11/2008   exercise stress test normal but limited by exercise intolerance   Obesity    Shortness of breath    Sleep apnea    Past Surgical History:  Procedure Laterality Date   CHOLECYSTECTOMY  05/2002   [place]- gallstones, small renal cyst 05/2002    COLONOSCOPY WITH PROPOFOL N/A 07/15/2019   Procedure: COLONOSCOPY WITH PROPOFOL;  Surgeon: Yetta Flock, MD;  Location: [facility];  Service: Gastroenterology;  Laterality: N/A;   FOOT SURGERY  11/1998   bunion   PARTIAL HYSTERECTOMY  1998   fibroids, ovaries remain   POLYPECTOMY  07/15/2019   Procedure: POLYPECTOMY;  Surgeon: Yetta Flock, MD;  Location: [facility];  Service: Gastroenterology;;   Social History   Tobacco Use   Smoking status: Former    Packs/day: 0.25    Years: 5.00    Total pack years: 1.25    Types: Cigarettes    Quit date: 03/20/1976    Years since quitting: 46.1   Smokeless tobacco: Never  Substance Use Topics   Alcohol use: No    Alcohol/week: 0.0 standard drinks of alcohol   Drug use: No   Family History  Problem Relation Age of Onset   Colon cancer Mother        23's   Stomach cancer Mother    Cancer Mother    Lung cancer Father        smoker   Cancer Father    Hypertension Sister    Dementia Sister    Hypertension Sister    Stroke Maternal Grandmother    Heart attack Maternal Grandfather    Stroke Paternal Grandmother    Colon polyps Daughter    Allergies  Allergen Reactions   Calcium Carbonate Other (See Comments)    constipation   Penicillins Rash    Has patient had a PCN reaction causing immediate rash, facial/tongue/throat swelling, SOB or lightheadedness with hypotension: no Has patient had a PCN reaction causing severe rash involving mucus membranes or skin necrosis: no Has patient had a PCN reaction that required hospitalization: no Has patient had a PCN reaction occurring within the last 10 years: no If all of the above answers are "NO", then may proceed with Cephalosporin use.    Current Outpatient Medications on File Prior to Visit  Medication Sig Dispense Refill   aspirin EC 81 MG tablet Take 1 tablet (81 mg total) by mouth daily. Swallow whole. 30 tablet 0   atorvastatin (LIPITOR) 20 MG tablet Take 1  tablet (20 mg total) by mouth daily. 30 tablet 0   clopidogrel (PLAVIX) 75 MG tablet Take 1 tablet (75 mg total) by mouth daily. 21 tablet 0   fluticasone (FLONASE) 50 MCG/ACT nasal spray Place 2 sprays into both nostrils as needed for allergies or rhinitis.  No current facility-administered medications on file prior to visit.     Review of Systems  Constitutional:  Positive for fatigue. Negative for activity change, appetite change, fever and unexpected weight change.  HENT:  Negative for congestion, ear pain, rhinorrhea, sinus pressure and sore throat.   Eyes:  Negative for pain, redness and visual disturbance.  Respiratory:  Negative for cough, shortness of breath and wheezing.   Cardiovascular:  Negative for chest pain and palpitations.  Gastrointestinal:  Negative for abdominal pain, blood in stool, constipation and diarrhea.  Endocrine: Negative for polydipsia and polyuria.  Genitourinary:  Negative for dysuria, frequency and urgency.  Musculoskeletal:  Negative for arthralgias, back pain and myalgias.  Skin:  Negative for pallor and rash.  Allergic/Immunologic: Negative for environmental allergies.  Neurological:  Negative for dizziness, tremors, seizures, syncope, facial asymmetry, speech difficulty, weakness, light-headedness, numbness and headaches.       Feels back to normal  Hematological:  Negative for adenopathy. Does not bruise/bleed easily.  Psychiatric/Behavioral:  Negative for decreased concentration and dysphoric mood. The patient is not nervous/anxious.        Objective:   Physical Exam Constitutional:      General: She is not in acute distress.    Appearance: She is well-developed.  HENT:     Head: Normocephalic and atraumatic.  Eyes:     Conjunctiva/sclera: Conjunctivae normal.     Pupils: Pupils are equal, round, and reactive to light.  Neck:     Thyroid: No thyromegaly.     Vascular: No carotid bruit or JVD.  Cardiovascular:     Rate and Rhythm:  Normal rate and regular rhythm.     Heart sounds: Normal heart sounds.     No gallop.  Pulmonary:     Effort: Pulmonary effort is normal. No respiratory distress.     Breath sounds: Normal breath sounds. No wheezing or rales.  Abdominal:     General: There is no distension or abdominal bruit.     Palpations: Abdomen is soft.  Musculoskeletal:     Cervical back: Normal range of motion and neck supple.     Right lower leg: No edema.     Left lower leg: No edema.  Lymphadenopathy:     Cervical: No cervical adenopathy.  Skin:    General: Skin is warm and dry.     Coloration: Skin is not pale.     Findings: No rash.  Neurological:     General: No focal deficit present.     Mental Status: She is alert.     Cranial Nerves: No cranial nerve deficit.     Sensory: No sensory deficit.     Motor: No weakness.     Coordination: Coordination normal.     Gait: Gait normal.     Deep Tendon Reflexes: Reflexes are normal and symmetric. Reflexes normal.  Psychiatric:        Mood and Affect: Mood normal.           Assessment & Plan:   Problem List Items Addressed This Visit       Cardiovascular and Mediastinum   Essential hypertension    Now getting back on chlortalidone s/p cva  Re started at 7 day point yesterday BP: 138/70  Will monitor closely  F/u planned in about 2 weeks for visit and labs      Relevant Medications   chlorthalidone (HYGROTON) 25 MG tablet   Intracranial carotid stenosis    Near occlusive stenosis of R  ICA on imaging (CTA) in hospital   With CVA in R MCA territory    Will continue both plavix and asa for 90 days then likely change to asa alone per neurology  Bp control  Aggressive cholesterol control Will likely medically manage-but her neurologist may also touch base with vascular       Relevant Medications   chlorthalidone (HYGROTON) 25 MG tablet   TIA (transient ischemic attack) - Primary    While symptoms (L facial droop) were very brief ,  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  Also noted R carotid stenosis /severe  Has OSA-not compliant with cpap due to fit issues   Is on plavix and asa -will plan to continue this (per neurology) for 90 days then likely just asa  Tolerating well  Permissive HTN-started back on bp med yesterday  Echocardiogram -no source of emb noted  Has neuro f/u in April -pt is aware Disc ER precautions- inst to call 911 if any return of neuro symptoms F/u in 2 weeks here   Has neuro appt in April  Ref to pulm for f/u of OSA      Relevant Medications   chlorthalidone (HYGROTON) 25 MG tablet     Respiratory   OSA (obstructive sleep apnea)    In setting of recent stroke- important to treat  Having mask /fitting issues and has not been wearing  Will ref to pulmonary for f/u of this       Relevant Orders   Ambulatory referral to Pulmonology     Other   Hyperlipidemia    Mild in setting of TIA recent with carotid stenosis   Last LDL of 80 Goal under 70  Disc diet  Disc goals for lipids and reasons to control them Rev last labs with pt Rev low sat fat diet in detail  Was started on atorvastatin 20 mg daily in hospital  Tolerating well Labs later this mo      Relevant Medications   chlorthalidone (HYGROTON) 25 MG tablet   Morbid obesity with body mass index (BMI) of 50.0 to 59.9 in adult (HCC)    Enc wt loss with low glycemic diet  Exercise when recovered from TIA      Prediabetes    Lab Results  Component Value Date   HGBA1C 4.7 (L) 04/25/2022  disc imp of low glycemic diet and wt loss to prevent DM2

## 2022-05-07 ENCOUNTER — Telehealth: Payer: Self-pay | Admitting: Family Medicine

## 2022-05-07 DIAGNOSIS — R7303 Prediabetes: Secondary | ICD-10-CM

## 2022-05-07 DIAGNOSIS — I10 Essential (primary) hypertension: Secondary | ICD-10-CM

## 2022-05-07 DIAGNOSIS — E7800 Pure hypercholesterolemia, unspecified: Secondary | ICD-10-CM

## 2022-05-07 NOTE — Telephone Encounter (Signed)
-----   Message from Velna Hatchet, RT sent at 04/24/2022  9:28 AM EST ----- Regarding: Mon 2/19 lab Patient is scheduled for cpx, please order future labs.  Thanks, Anda Kraft

## 2022-05-08 ENCOUNTER — Other Ambulatory Visit: Payer: Medicare HMO

## 2022-05-08 ENCOUNTER — Other Ambulatory Visit (INDEPENDENT_AMBULATORY_CARE_PROVIDER_SITE_OTHER): Payer: Medicare HMO

## 2022-05-08 DIAGNOSIS — R7303 Prediabetes: Secondary | ICD-10-CM

## 2022-05-08 DIAGNOSIS — E7800 Pure hypercholesterolemia, unspecified: Secondary | ICD-10-CM

## 2022-05-08 DIAGNOSIS — I10 Essential (primary) hypertension: Secondary | ICD-10-CM

## 2022-05-08 LAB — CBC WITH DIFFERENTIAL/PLATELET
Basophils Absolute: 0 10*3/uL (ref 0.0–0.1)
Basophils Relative: 0.5 % (ref 0.0–3.0)
Eosinophils Absolute: 0.2 10*3/uL (ref 0.0–0.7)
Eosinophils Relative: 2.8 % (ref 0.0–5.0)
HCT: 41.6 % (ref 36.0–46.0)
Hemoglobin: 13.8 g/dL (ref 12.0–15.0)
Lymphocytes Relative: 27.6 % (ref 12.0–46.0)
Lymphs Abs: 2.2 10*3/uL (ref 0.7–4.0)
MCHC: 33 g/dL (ref 30.0–36.0)
MCV: 86.5 fl (ref 78.0–100.0)
Monocytes Absolute: 0.4 10*3/uL (ref 0.1–1.0)
Monocytes Relative: 5.6 % (ref 3.0–12.0)
Neutro Abs: 5 10*3/uL (ref 1.4–7.7)
Neutrophils Relative %: 63.5 % (ref 43.0–77.0)
Platelets: 271 10*3/uL (ref 150.0–400.0)
RBC: 4.81 Mil/uL (ref 3.87–5.11)
RDW: 14.5 % (ref 11.5–15.5)
WBC: 7.9 10*3/uL (ref 4.0–10.5)

## 2022-05-08 LAB — COMPREHENSIVE METABOLIC PANEL
ALT: 11 U/L (ref 0–35)
AST: 18 U/L (ref 0–37)
Albumin: 4.1 g/dL (ref 3.5–5.2)
Alkaline Phosphatase: 65 U/L (ref 39–117)
BUN: 16 mg/dL (ref 6–23)
CO2: 27 mEq/L (ref 19–32)
Calcium: 9.9 mg/dL (ref 8.4–10.5)
Chloride: 102 mEq/L (ref 96–112)
Creatinine, Ser: 1.09 mg/dL (ref 0.40–1.20)
GFR: 51.89 mL/min — ABNORMAL LOW (ref >60.00)
Glucose, Bld: 98 mg/dL (ref 70–99)
Potassium: 3.5 mEq/L (ref 3.5–5.1)
Sodium: 139 mEq/L (ref 135–145)
Total Bilirubin: 0.7 mg/dL (ref 0.2–1.2)
Total Protein: 7.3 g/dL (ref 6.0–8.3)

## 2022-05-08 LAB — TSH: TSH: 4.93 u[IU]/mL (ref 0.35–5.50)

## 2022-05-08 LAB — LIPID PANEL
Cholesterol: 111 mg/dL (ref 0–200)
HDL: 50.6 mg/dL (ref >39.00)
LDL Cholesterol: 48 mg/dL (ref 0–99)
NonHDL: 60.72
Total CHOL/HDL Ratio: 2
Triglycerides: 64 mg/dL (ref 0.0–149.0)
VLDL: 12.8 mg/dL (ref 0.0–40.0)

## 2022-05-09 LAB — HEMOGLOBIN A1C
Hgb A1c MFr Bld: 4.8 % of total Hgb (ref <5.7)
Mean Plasma Glucose: 91 mg/dL
eAG (mmol/L): 5 mmol/L

## 2022-05-16 ENCOUNTER — Ambulatory Visit: Payer: Medicare HMO | Admitting: Adult Health

## 2022-05-17 ENCOUNTER — Encounter: Payer: Self-pay | Admitting: Family Medicine

## 2022-05-17 ENCOUNTER — Telehealth: Payer: Self-pay | Admitting: Family Medicine

## 2022-05-17 ENCOUNTER — Ambulatory Visit (INDEPENDENT_AMBULATORY_CARE_PROVIDER_SITE_OTHER): Payer: Medicare HMO | Admitting: Family Medicine

## 2022-05-17 VITALS — BP 136/72 | HR 82 | Temp 97.6°F | Ht 62.0 in | Wt 279.5 lb

## 2022-05-17 DIAGNOSIS — M25511 Pain in right shoulder: Secondary | ICD-10-CM | POA: Diagnosis not present

## 2022-05-17 DIAGNOSIS — G4733 Obstructive sleep apnea (adult) (pediatric): Secondary | ICD-10-CM

## 2022-05-17 DIAGNOSIS — G8929 Other chronic pain: Secondary | ICD-10-CM

## 2022-05-17 DIAGNOSIS — G459 Transient cerebral ischemic attack, unspecified: Secondary | ICD-10-CM

## 2022-05-17 DIAGNOSIS — L989 Disorder of the skin and subcutaneous tissue, unspecified: Secondary | ICD-10-CM

## 2022-05-17 DIAGNOSIS — E7800 Pure hypercholesterolemia, unspecified: Secondary | ICD-10-CM

## 2022-05-17 DIAGNOSIS — R7303 Prediabetes: Secondary | ICD-10-CM

## 2022-05-17 DIAGNOSIS — Z1211 Encounter for screening for malignant neoplasm of colon: Secondary | ICD-10-CM

## 2022-05-17 DIAGNOSIS — L308 Other specified dermatitis: Secondary | ICD-10-CM

## 2022-05-17 DIAGNOSIS — Z6841 Body Mass Index (BMI) 40.0 and over, adult: Secondary | ICD-10-CM

## 2022-05-17 DIAGNOSIS — Z0000 Encounter for general adult medical examination without abnormal findings: Secondary | ICD-10-CM | POA: Diagnosis not present

## 2022-05-17 DIAGNOSIS — I10 Essential (primary) hypertension: Secondary | ICD-10-CM | POA: Diagnosis not present

## 2022-05-17 MED ORDER — CLOPIDOGREL BISULFATE 75 MG PO TABS: 75.0000 mg | ORAL_TABLET | Freq: Every day | ORAL | 0 refills | Status: DC

## 2022-05-17 MED ORDER — ATORVASTATIN CALCIUM 20 MG PO TABS: 20.0000 mg | ORAL_TABLET | Freq: Every day | ORAL | 3 refills | Status: DC

## 2022-05-17 NOTE — Assessment & Plan Note (Signed)
bp in fair control at this time  BP Readings from Last 1 Encounters:  05/17/22 136/72   No changes needed Most recent labs reviewed  Disc lifstyle change with low sodium diet and exercise  Continues chlorthalidone 12.5 mg daily and tolerates it well

## 2022-05-17 NOTE — Assessment & Plan Note (Signed)
Lab Results  Component Value Date   HGBA1C 4.8 05/08/2022    disc imp of low glycemic diet and wt loss to prevent DM2  In good control

## 2022-05-17 NOTE — Assessment & Plan Note (Signed)
Discussed how this problem influences overall health and the risks it imposes  Reviewed plan for weight loss with lower calorie diet (via better food choices and also portion control or program like weight watchers) and exercise building up to or more than 30 minutes 5 days per week including some aerobic activity    

## 2022-05-17 NOTE — Patient Instructions (Addendum)
Think about what you can add for more exercise   Re schedule your mammogram for when you come off plavix   Get vitamin D3 over the counter and take 1000 iu daily  This is for bone health     Keep using the moisturizer on face  I can put a dermatology referral in later this week  Try cort aid over the counter also   Drink lots of water  Keep working on healthy diet for weight loss   Keep up the good work!

## 2022-05-17 NOTE — Assessment & Plan Note (Signed)
Areas of scale and pallor on face ? If this is eczema  Derm referral discussed

## 2022-05-17 NOTE — Progress Notes (Signed)
Subjective:    Patient ID: Barbara Kramer, female    DOB: [birth year], 72 y.o.   MRN: [id number]  HPI Here for health maintenance exam and to review chronic medical problems    Wt Readings from Last 3 Encounters:  05/17/22 279 lb 8 oz (126.8 kg)  05/03/22 283 lb 6 oz (128.5 kg)  04/25/22 283 lb 4.7 oz (128.5 kg)   51.12 kg/m  Eating better  Wt is down  Eating more chicken and fish for her protein (has passed on red meat)  Avoiding fried foods   Not a lot of exercise  Uses her Qbie machine occ   Took care of sister in hospital- long walk in med mall those days  She has alz at 51 and is in hospice  That is hard on her  Also took care of her aunt for a while   She has a bruise on R arm On blood thinners  Has trouble raising R arm above head (may have injured herself getting in a high bed)  ? Arthritis   Has 3 pale dry spots on her face    Immunization History  Administered Date(s) Administered   Fluad Quad(high Dose 65+) 03/18/2019, 05/14/2020   Influenza,inj,Quad PF,6+ Mos 02/21/2018   PFIZER(Purple Top)SARS-COV-2 Vaccination 06/06/2019, 06/27/2019, 05/21/2020   Pneumococcal Conjugate-13 02/21/2018   Pneumococcal Polysaccharide-23 03/18/2019   Td 08/15/2006   Tdap 04/22/2015   Health Maintenance Due  Topic Date Due   Zoster Vaccines- Shingrix (1 of 2) Never done   MAMMOGRAM  05/12/2022   Medicare Annual Wellness (AWV)  05/17/2022   Amw call scheduled tomorrow  Shingrix-[facility] of [facility]  Mammogram 04/2021  - was scheduled to have one tomorrow but had to cancel it  She was worried about bruising - wants to postpone until after done with plavix  Self breast exam: no lumps   Dexa 04/2018 -normal BMD, at [place]: none Fractures:none  Supplements -none Exercise = limited   Colon cancer screening  Colonoscopy 06/2019 5 y recall for family history    HTN  bp is stable today  No cp or palpitations or headaches or edema  No side effects to  medicines  BP Readings from Last 3 Encounters:  05/17/22 136/72  05/03/22 138/70  04/26/22 (!) 150/71    Chlorthalidone 12.5 mg daily  Lab Results  Component Value Date   CREATININE 1.09 05/08/2022   BUN 16 05/08/2022   NA 139 05/08/2022   K 3.5 05/08/2022   CL 102 05/08/2022   CO2 27 05/08/2022   GFR 51.8   Past h/o carotid stenosis and TIA Takes plavix and asa,  then change to asa per neuro (has appt in April)   Has OSA-has pulm appt tomorrow  Cpap  Hyperlipidemia  Lab Results  Component Value Date   CHOL 111 05/08/2022   CHOL 137 04/25/2022   CHOL 148 05/10/2021   Lab Results  Component Value Date   HDL 50.60 05/08/2022   HDL 47 04/25/2022   HDL 51.80 05/10/2021   Lab Results  Component Value Date   LDLCALC 48 05/08/2022   LDLCALC 80 04/25/2022   LDLCALC 84 05/10/2021   Lab Results  Component Value Date   TRIG 64.0 05/08/2022   TRIG 52 04/25/2022   TRIG 61.0 05/10/2021   Lab Results  Component Value Date   CHOLHDL 2 05/08/2022   CHOLHDL 2.9 04/25/2022   CHOLHDL 3 05/10/2021   No results found for: "  LDLDIRECT" Atorvastatin 20 mg daily  LDL is down to 48 - at goal  With h/o past TIA  Prediabetes Lab Results  Component Value Date   HGBA1C 4.8 05/08/2022   Very well controlled   Lab Results  Component Value Date   WBC 7.9 05/08/2022   HGB 13.8 05/08/2022   HCT 41.6 05/08/2022   MCV 86.5 05/08/2022   PLT 271.0 05/08/2022   Lab Results  Component Value Date   TSH 4.93 05/08/2022    Lab Results  Component Value Date   ALT 11 05/08/2022   AST 18 05/08/2022   ALKPHOS 65 05/08/2022   BILITOT 0.7 05/08/2022    Patient Active Problem List   Diagnosis Date Noted   Skin lesions 05/17/2022   Hyperlipidemia 05/03/2022   Intracranial carotid stenosis 04/25/2022   TIA (transient ischemic attack) 04/24/2022   Sciatica of left side 01/04/2022   Mobility impaired 08/01/2021   Head pain 06/28/2021   Vulvar cyst 01/25/2021   Rash of neck  08/02/2020   Essential hypertension 06/30/2020   Medicare annual wellness visit, subsequent 05/14/2020   Unilateral primary osteoarthritis, left knee 05/10/2020   Morbid obesity with body mass index (BMI) of 50.0 to 59.9 in adult [facility]) 05/10/2020   Family history of colon cancer in mother    Benign neoplasm of colon    OSA (obstructive sleep apnea) 01/29/2018   Estrogen deficiency 01/29/2018   Allergic rhinitis 05/15/2017   Screening mammogram, encounter for 10/11/2016   Prediabetes 12/15/2014   Osteoarthritis of left knee 10/16/2013   Colon cancer screening 11/20/2012   Routine gynecological examination 11/17/2011   Other screening mammogram 11/17/2011   Right shoulder pain 06/05/2011   Low back pain 10/18/2010   Routine general medical examination at a health care facility 10/06/2010   ACANTHOSIS NIGRICANS 01/27/2008   Eczema 08/02/2006   Past Medical History:  Diagnosis Date   Anemia    Arthritis    Breast lump    left   Eczema    Family hx of colon cancer    Gallbladder problem    Hypertension    Kidney cysts    small, right 10/2002   Normal cardiac stress test 11/2008   exercise stress test normal but limited by exercise intolerance   Obesity    Shortness of breath    Sleep apnea    Past Surgical History:  Procedure Laterality Date   CHOLECYSTECTOMY  05/2002   [place]- gallstones, small renal cyst 05/2002   COLONOSCOPY WITH PROPOFOL N/A 07/15/2019   Procedure: COLONOSCOPY WITH PROPOFOL;  Surgeon: Yetta Flock, MD;  Location: [facility];  Service: Gastroenterology;  Laterality: N/A;   FOOT SURGERY  11/1998   bunion   PARTIAL HYSTERECTOMY  1998   fibroids, ovaries remain   POLYPECTOMY  07/15/2019   Procedure: POLYPECTOMY;  Surgeon: Yetta Flock, MD;  Location: [facility];  Service: Gastroenterology;;   Social History   Tobacco Use   Smoking status: Former    Packs/day: 0.25    Years: 5.00    Total pack years: 1.25    Types: Cigarettes    Quit  date: 03/20/1976    Years since quitting: 46.1   Smokeless tobacco: Never  Substance Use Topics   Alcohol use: No    Alcohol/week: 0.0 standard drinks of alcohol   Drug use: No   Family History  Problem Relation Age of Onset   Colon cancer Mother        61's   Stomach  cancer Mother    Cancer Mother    Lung cancer Father        smoker   Cancer Father    Hypertension Sister    Dementia Sister    Hypertension Sister    Stroke Maternal Grandmother    Heart attack Maternal Grandfather    Stroke Paternal Grandmother    Colon polyps Daughter    Allergies  Allergen Reactions   Calcium Carbonate Other (See Comments)    constipation   Penicillins Rash    Has patient had a PCN reaction causing immediate rash, facial/tongue/throat swelling, SOB or lightheadedness with hypotension: no Has patient had a PCN reaction causing severe rash involving mucus membranes or skin necrosis: no Has patient had a PCN reaction that required hospitalization: no Has patient had a PCN reaction occurring within the last 10 years: no If all of the above answers are "NO", then may proceed with Cephalosporin use.    Current Outpatient Medications on File Prior to Visit  Medication Sig Dispense Refill   aspirin EC 81 MG tablet Take 1 tablet (81 mg total) by mouth daily. Swallow whole. 30 tablet 0   chlorthalidone (HYGROTON) 25 MG tablet Take 0.5 tablets (12.5 mg total) by mouth daily. 45 tablet 3   fluticasone (FLONASE) 50 MCG/ACT nasal spray Place 2 sprays into both nostrils as needed for allergies or rhinitis.     No current facility-administered medications on file prior to visit.    Review of Systems  Constitutional:  Negative for activity change, appetite change, fatigue, fever and unexpected weight change.  HENT:  Negative for congestion, ear pain, rhinorrhea, sinus pressure and sore throat.   Eyes:  Negative for pain, redness and visual disturbance.  Respiratory:  Negative for cough, shortness of  breath and wheezing.   Cardiovascular:  Negative for chest pain and palpitations.  Gastrointestinal:  Negative for abdominal pain, blood in stool, constipation and diarrhea.  Endocrine: Negative for polydipsia and polyuria.  Genitourinary:  Negative for dysuria, frequency and urgency.  Musculoskeletal:  Positive for arthralgias. Negative for back pain and myalgias.  Skin:  Positive for rash. Negative for pallor.  Allergic/Immunologic: Negative for environmental allergies.  Neurological:  Negative for dizziness, syncope and headaches.  Hematological:  Negative for adenopathy. Does not bruise/bleed easily.  Psychiatric/Behavioral:  Negative for decreased concentration and dysphoric mood. The patient is not nervous/anxious.        Objective:   Physical Exam Constitutional:      General: She is not in acute distress.    Appearance: Normal appearance. She is well-developed. She is obese. She is not ill-appearing or diaphoretic.  HENT:     Head: Normocephalic and atraumatic.     Right Ear: Tympanic membrane, ear canal and external ear normal.     Left Ear: Tympanic membrane, ear canal and external ear normal.     Nose: Nose normal. No congestion.     Mouth/Throat:     Mouth: Mucous membranes are moist.     Pharynx: Oropharynx is clear. No posterior oropharyngeal erythema.  Eyes:     General: No scleral icterus.    Extraocular Movements: Extraocular movements intact.     Conjunctiva/sclera: Conjunctivae normal.     Pupils: Pupils are equal, round, and reactive to light.  Neck:     Thyroid: No thyromegaly.     Vascular: No carotid bruit or JVD.  Cardiovascular:     Rate and Rhythm: Normal rate and regular rhythm.     Pulses: Normal  pulses.     Heart sounds: Normal heart sounds.     No gallop.  Pulmonary:     Effort: Pulmonary effort is normal. No respiratory distress.     Breath sounds: Normal breath sounds. No wheezing.     Comments: Good air exch Chest:     Chest wall: No  tenderness.  Abdominal:     General: Bowel sounds are normal. There is no distension or abdominal bruit.     Palpations: Abdomen is soft. There is no mass.     Tenderness: There is no abdominal tenderness.     Hernia: No hernia is present.  Genitourinary:    Comments: Breast exam: No mass, nodules, thickening, tenderness, bulging, retraction, inflamation, nipple discharge or skin changes noted.  No axillary or clavicular LA.     Musculoskeletal:        General: No tenderness. Normal range of motion.     Cervical back: Normal range of motion and neck supple. No rigidity. No muscular tenderness.     Right lower leg: No edema.     Left lower leg: No edema.     Comments: No kyphosis   Lymphadenopathy:     Cervical: No cervical adenopathy.  Skin:    General: Skin is warm and dry.     Coloration: Skin is not pale.     Findings: No erythema or rash.     Comments: Some pale spots on face with scale  Eczema   Neurological:     Mental Status: She is alert. Mental status is at baseline.     Cranial Nerves: No cranial nerve deficit.     Motor: No abnormal muscle tone.     Coordination: Coordination normal.     Gait: Gait normal.     Deep Tendon Reflexes: Reflexes are normal and symmetric. Reflexes normal.  Psychiatric:        Mood and Affect: Mood normal.        Cognition and Memory: Cognition and memory normal.           Assessment & Plan:   Problem List Items Addressed This Visit       Cardiovascular and Mediastinum   Essential hypertension    bp in fair control at this time  BP Readings from Last 1 Encounters:  05/17/22 136/72  No changes needed Most recent labs reviewed  Disc lifstyle change with low sodium diet and exercise  Continues chlorthalidone 12.5 mg daily and tolerates it well       Relevant Medications   atorvastatin (LIPITOR) 20 MG tablet   TIA (transient ischemic attack)    Has neuro f/u in April  Taking both plavix and asa for 90 d      Relevant  Medications   atorvastatin (LIPITOR) 20 MG tablet     Respiratory   OSA (obstructive sleep apnea)    Using cpap Has pulm appt tomorrow         Musculoskeletal and Integument   Eczema    Pt is interested in derm referral  More dry/pale spots on face Apprehensive to use px steroid in this area Enc her to continue scent free moisturizer       Skin lesions    Areas of scale and pallor on face ? If this is eczema  Derm referral discussed         Other   Colon cancer screening    Colonoscoy 06/2019 with 5 y recall for fam hx Is utd  Hyperlipidemia    Disc goals for lipids and reasons to control them Rev last labs with pt Rev low sat fat diet in detail LDL is down to 48 with atorvastatin 20 mg daily  Goal is less than 70 in light of TIA history       Relevant Medications   atorvastatin (LIPITOR) 20 MG tablet   Morbid obesity with body mass index (BMI) of 50.0 to 59.9 in adult [facility])    Discussed how this problem influences overall health and the risks it imposes  Reviewed plan for weight loss with lower calorie diet (via better food choices and also portion control or program like weight watchers) and exercise building up to or more than 30 minutes 5 days per week including some aerobic activity        Prediabetes    Lab Results  Component Value Date   HGBA1C 4.8 05/08/2022   disc imp of low glycemic diet and wt loss to prevent DM2  In good control       Right shoulder pain    Will plan an appt with sport med for eval and tx       Routine general medical examination at a health care facility - Primary    Reviewed health habits including diet and exercise and skin cancer prevention Reviewed appropriate screening tests for age  Also reviewed health mt list, fam hx and immunization status , as well as social and family history   See HPI Labs reviewed  Enc further wt loss  Amw scheduled tomorrow  Mammogram put off (pt wants to wait until done with plavix so  she does not get bruised) Nl dexa 2020 No falls or fx/ disc rec for supplements  Colonoscopy utd 06/2019 with 5 y recall

## 2022-05-17 NOTE — Assessment & Plan Note (Signed)
Colonoscoy 06/2019 with 5 y recall for fam hx Is utd

## 2022-05-17 NOTE — Assessment & Plan Note (Signed)
Pt is interested in derm referral  More dry/pale spots on face Apprehensive to use px steroid in this area Enc her to continue scent free moisturizer

## 2022-05-17 NOTE — Assessment & Plan Note (Signed)
Has neuro f/u in April  Taking both plavix and asa for 90 d

## 2022-05-17 NOTE — Telephone Encounter (Signed)
Pt called in stated she all out of RXclopidogrel (PLAVIX) 75 MG tablet  . Refill was sent to [facility] that will take a few days or [facility] some be [facility] . Please advise [phone]

## 2022-05-17 NOTE — Assessment & Plan Note (Signed)
Disc goals for lipids and reasons to control them Rev last labs with pt Rev low sat fat diet in detail LDL is down to 48 with atorvastatin 20 mg daily  Goal is less than 70 in light of TIA history

## 2022-05-17 NOTE — Telephone Encounter (Signed)
[facility]

## 2022-05-17 NOTE — Assessment & Plan Note (Signed)
Using cpap Has pulm appt tomorrow

## 2022-05-17 NOTE — Assessment & Plan Note (Signed)
Will plan an appt with sport med for eval and tx

## 2022-05-17 NOTE — Assessment & Plan Note (Signed)
Reviewed health habits including diet and exercise and skin cancer prevention Reviewed appropriate screening tests for age  Also reviewed health mt list, fam hx and immunization status , as well as social and family history   See HPI Labs reviewed  Enc further wt loss  Amw scheduled tomorrow  Mammogram put off (pt wants to wait until done with plavix so she does not get bruised) Nl dexa 2020 No falls or fx/ disc rec for supplements  Colonoscopy utd 06/2019 with 5 y recall

## 2022-05-17 NOTE — Telephone Encounter (Signed)
Pt notified Rx sent 

## 2022-05-18 ENCOUNTER — Encounter: Payer: Self-pay | Admitting: Adult Health

## 2022-05-18 ENCOUNTER — Ambulatory Visit (INDEPENDENT_AMBULATORY_CARE_PROVIDER_SITE_OTHER): Payer: Medicare HMO

## 2022-05-18 ENCOUNTER — Ambulatory Visit (INDEPENDENT_AMBULATORY_CARE_PROVIDER_SITE_OTHER): Payer: Medicare HMO | Admitting: Adult Health

## 2022-05-18 VITALS — BP 120/64 | HR 88 | Temp 97.6°F | Ht 62.0 in | Wt 279.0 lb

## 2022-05-18 VITALS — Ht 62.0 in | Wt 279.0 lb

## 2022-05-18 DIAGNOSIS — G4733 Obstructive sleep apnea (adult) (pediatric): Secondary | ICD-10-CM

## 2022-05-18 DIAGNOSIS — Z0000 Encounter for general adult medical examination without abnormal findings: Secondary | ICD-10-CM | POA: Diagnosis not present

## 2022-05-18 DIAGNOSIS — G459 Transient cerebral ischemic attack, unspecified: Secondary | ICD-10-CM

## 2022-05-18 NOTE — Patient Instructions (Signed)
Barbara Kramer , Thank you for taking time to come for your Medicare Wellness Visit. I appreciate your ongoing commitment to your health goals. Please review the following plan we discussed and let me know if I can assist you in the future.   These are the goals we discussed:  Goals      Patient Stated     03/11/2019, I will try to work on losing some weight.     Patient Stated     Lose 150lb.        This is a list of the screening recommended for you and due dates:  Health Maintenance  Topic Date Due   COVID-19 Vaccine (4 - 2023-24 season) 05/19/2022*   Flu Shot  06/18/2022*   Mammogram  05/18/2023*   Medicare Annual Wellness Visit  05/18/2023   DTaP/Tdap/Td vaccine (3 - Td or Tdap) 04/21/2025   Colon Cancer Screening  07/14/2029   Pneumonia Vaccine  Completed   DEXA scan (bone density measurement)  Completed   Hepatitis C Screening: USPSTF Recommendation to screen - Ages 56-79 yo.  Completed   Zoster (Shingles) Vaccine  Completed   HPV Vaccine  Aged Out  *Topic was postponed. The date shown is not the original due date.    Advanced directives: Advance directive discussed with you today. Even though you declined this today, please call our office should you change your mind, and we can give you the proper paperwork for you to fill out.   Conditions/risks identified: Aim for 30 minutes of exercise or brisk walking, 6-8 glasses of water, and 5 servings of fruits and vegetables each day.   Next appointment: Follow up in one year for your annual wellness visit 05/24/2023 @ 8:15 via telephone.   Preventive Care 39 Years and Older, Female Preventive care refers to lifestyle choices and visits with your health care provider that can promote health and wellness. What does preventive care include? A yearly physical exam. This is also called an annual well check. Dental exams once or twice a year. Routine eye exams. Ask your health care provider how often you should have your eyes  checked. Personal lifestyle choices, including: Daily care of your teeth and gums. Regular physical activity. Eating a healthy diet. Avoiding tobacco and drug use. Limiting alcohol use. Practicing safe sex. Taking low-dose aspirin every day. Taking vitamin and mineral supplements as recommended by your health care provider. What happens during an annual well check? The services and screenings done by your health care provider during your annual well check will depend on your age, overall health, lifestyle risk factors, and family history of disease. Counseling  Your health care provider may ask you questions about your: Alcohol use. Tobacco use. Drug use. Emotional well-being. Home and relationship well-being. Sexual activity. Eating habits. History of falls. Memory and ability to understand (cognition). Work and work Statistician. Reproductive health. Screening  You may have the following tests or measurements: Height, weight, and BMI. Blood pressure. Lipid and cholesterol levels. These may be checked every 5 years, or more frequently if you are over 99 years old. Skin check. Lung cancer screening. You may have this screening every year starting at age 37 if you have a 30-pack-year history of smoking and currently smoke or have quit within the past 15 years. Fecal occult blood test (FOBT) of the stool. You may have this test every year starting at age 37. Flexible sigmoidoscopy or colonoscopy. You may have a sigmoidoscopy every 5 years or a colonoscopy  every 10 years starting at age 26. Hepatitis C blood test. Hepatitis B blood test. Sexually transmitted disease (STD) testing. Diabetes screening. This is done by checking your blood sugar (glucose) after you have not eaten for a while (fasting). You may have this done every 1-3 years. Bone density scan. This is done to screen for osteoporosis. You may have this done starting at age 74. Mammogram. This may be done every 1-2  years. Talk to your health care provider about how often you should have regular mammograms. Talk with your health care provider about your test results, treatment options, and if necessary, the need for more tests. Vaccines  Your health care provider may recommend certain vaccines, such as: Influenza vaccine. This is recommended every year. Tetanus, diphtheria, and acellular pertussis (Tdap, Td) vaccine. You may need a Td booster every 10 years. Zoster vaccine. You may need this after age 64. Pneumococcal 13-valent conjugate (PCV13) vaccine. One dose is recommended after age 54. Pneumococcal polysaccharide (PPSV23) vaccine. One dose is recommended after age 75. Talk to your health care provider about which screenings and vaccines you need and how often you need them. This information is not intended to replace advice given to you by your health care provider. Make sure you discuss any questions you have with your health care provider. Document Released: 04/02/2015 Document Revised: 11/24/2015 Document Reviewed: 01/05/2015 Elsevier Interactive Patient Education  2017 [place] Prevention in the Home Falls can cause injuries. They can happen to people of all ages. There are many things you can do to make your home safe and to help prevent falls. What can I do on the outside of my home? Regularly fix the edges of walkways and driveways and fix any cracks. Remove anything that might make you trip as you walk through a door, such as a raised step or threshold. Trim any bushes or trees on the path to your home. Use bright outdoor lighting. Clear any walking paths of anything that might make someone trip, such as rocks or tools. Regularly check to see if handrails are loose or broken. Make sure that both sides of any steps have handrails. Any raised decks and porches should have guardrails on the edges. Have any leaves, snow, or ice cleared regularly. Use sand or salt on walking paths  during winter. Clean up any spills in your garage right away. This includes oil or grease spills. What can I do in the bathroom? Use night lights. Install grab bars by the toilet and in the tub and shower. Do not use towel bars as grab bars. Use non-skid mats or decals in the tub or shower. If you need to sit down in the shower, use a plastic, non-slip stool. Keep the floor dry. Clean up any water that spills on the floor as soon as it happens. Remove soap buildup in the tub or shower regularly. Attach bath mats securely with double-sided non-slip rug tape. Do not have throw rugs and other things on the floor that can make you trip. What can I do in the bedroom? Use night lights. Make sure that you have a light by your bed that is easy to reach. Do not use any sheets or blankets that are too big for your bed. They should not hang down onto the floor. Have a firm chair that has side arms. You can use this for support while you get dressed. Do not have throw rugs and other things on the floor that can make  you trip. What can I do in the kitchen? Clean up any spills right away. Avoid walking on wet floors. Keep items that you use a lot in easy-to-reach places. If you need to reach something above you, use a strong step stool that has a grab bar. Keep electrical cords out of the way. Do not use floor polish or wax that makes floors slippery. If you must use wax, use non-skid floor wax. Do not have throw rugs and other things on the floor that can make you trip. What can I do with my stairs? Do not leave any items on the stairs. Make sure that there are handrails on both sides of the stairs and use them. Fix handrails that are broken or loose. Make sure that handrails are as long as the stairways. Check any carpeting to make sure that it is firmly attached to the stairs. Fix any carpet that is loose or worn. Avoid having throw rugs at the top or bottom of the stairs. If you do have throw  rugs, attach them to the floor with carpet tape. Make sure that you have a light switch at the top of the stairs and the bottom of the stairs. If you do not have them, ask someone to add them for you. What else can I do to help prevent falls? Wear shoes that: Do not have high heels. Have rubber bottoms. Are comfortable and fit you well. Are closed at the toe. Do not wear sandals. If you use a stepladder: Make sure that it is fully opened. Do not climb a closed stepladder. Make sure that both sides of the stepladder are locked into place. Ask someone to hold it for you, if possible. Clearly mark and make sure that you can see: Any grab bars or handrails. First and last steps. Where the edge of each step is. Use tools that help you move around (mobility aids) if they are needed. These include: Canes. Walkers. Scooters. Crutches. Turn on the lights when you go into a dark area. Replace any light bulbs as soon as they burn out. Set up your furniture so you have a clear path. Avoid moving your furniture around. If any of your floors are uneven, fix them. If there are any pets around you, be aware of where they are. Review your medicines with your doctor. Some medicines can make you feel dizzy. This can increase your chance of falling. Ask your doctor what other things that you can do to help prevent falls. This information is not intended to replace advice given to you by your health care provider. Make sure you discuss any questions you have with your health care provider. Document Released: 12/31/2008 Document Revised: 08/12/2015 Document Reviewed: 04/10/2014 Elsevier Interactive Patient Education  2017 [employer].

## 2022-05-18 NOTE — Telephone Encounter (Signed)
Just to make sure  - per hospital notes, neurology recommended DAPT x 3 weeks then aspirin alone, she has completed 3 weeks of DAPT so it may be appropriate to continue just aspirin at this point.

## 2022-05-18 NOTE — Patient Instructions (Signed)
Set up for Split night sleep study  Work on healthy weight  Do not drive if sleepy  Work on healthy sleep regimen  Follow up with Dr. Halford Chessman  Or Doriann Zuch in 6 weeks to discuss results and treatment plan

## 2022-05-18 NOTE — Telephone Encounter (Signed)
Left VM letting pt know Dr. Tower's comments  

## 2022-05-18 NOTE — Progress Notes (Signed)
I connected with  [name] on 05/18/22 by a audio enabled telemedicine application and verified that I am speaking with the correct person using two identifiers.  Patient Location: Home  Provider Location: Office/Clinic  I discussed the limitations of evaluation and management by telemedicine. The patient expressed understanding and agreed to proceed.  Subjective:   Barbara Kramer is a 72 y.o. female who presents for Medicare Annual (Subsequent) preventive examination.  Review of Systems     Cardiac Risk Factors include: advanced age (>3mn, >>22women);sedentary lifestyle;dyslipidemia     Objective:    Today's Vitals   05/18/22 0807  Weight: 279 lb (126.6 kg)  Height: '5\' 2"'$  (1.575 m)   Body mass index is 51.03 kg/m.     05/18/2022    8:17 AM 04/25/2022    4:09 AM 07/15/2019    7:51 AM 03/11/2019    9:46 AM 01/25/2018   12:44 PM  Advanced Directives  Does Patient Have a Medical Advance Directive? No No No No No  Does patient want to make changes to medical advance directive?    Yes (MAU/Ambulatory/Procedural Areas - Information given)   Would patient like information on creating a medical advance directive? No - Patient declined No - Patient declined No - Patient declined  No - Patient declined    Current Medications (verified) Outpatient Encounter Medications as of 05/18/2022  Medication Sig   aspirin EC 81 MG tablet Take 1 tablet (81 mg total) by mouth daily. Swallow whole.   atorvastatin (LIPITOR) 20 MG tablet Take 1 tablet (20 mg total) by mouth daily.   chlorthalidone (HYGROTON) 25 MG tablet Take 0.5 tablets (12.5 mg total) by mouth daily.   clopidogrel (PLAVIX) 75 MG tablet Take 1 tablet (75 mg total) by mouth daily.   fluticasone (FLONASE) 50 MCG/ACT nasal spray Place 2 sprays into both nostrils as needed for allergies or rhinitis. (Patient not taking: Reported on 05/18/2022)   No facility-administered encounter medications on file as of 05/18/2022.     Allergies (verified) Calcium carbonate and Penicillins   History: Past Medical History:  Diagnosis Date   Anemia    Arthritis    Breast lump    left   Eczema    Family hx of colon cancer    Gallbladder problem    Hypertension    Kidney cysts    small, right 10/2002   Normal cardiac stress test 11/2008   exercise stress test normal but limited by exercise intolerance   Obesity    Shortness of breath    Sleep apnea    Past Surgical History:  Procedure Laterality Date   CHOLECYSTECTOMY  05/2002   [place] gallstones, small renal cyst 05/2002   COLONOSCOPY WITH PROPOFOL N/A 07/15/2019   Procedure: COLONOSCOPY WITH PROPOFOL;  Surgeon: AYetta Flock MD;  Location: [facility];  Service: Gastroenterology;  Laterality: N/A;   FOOT SURGERY  11/1998   bunion   PARTIAL HYSTERECTOMY  1998   fibroids, ovaries remain   POLYPECTOMY  07/15/2019   Procedure: POLYPECTOMY;  Surgeon: AYetta Flock MD;  Location: [facility];  Service: Gastroenterology;;   Family History  Problem Relation Age of Onset   Colon cancer Mother        438's  Stomach cancer Mother    Cancer Mother    Lung cancer Father        smoker   Cancer Father    Hypertension Sister    Dementia Sister    Hypertension  Sister    Stroke Maternal Grandmother    Heart attack Maternal Grandfather    Stroke Paternal Grandmother    Colon polyps Daughter    Social History   Socioeconomic History   Marital status: Divorced    Spouse name: Not on file   Number of children: 2   Years of education: Not on file   Highest education level: Not on file  Occupational History   Occupation: Retired    Fish farm manager: [employer]  Tobacco Use   Smoking status: Former    Packs/day: 0.25    Years: 5.00    Total pack years: 1.25    Types: Cigarettes    Quit date: 03/20/1976    Years since quitting: 46.1   Smokeless tobacco: Never  Substance and Sexual Activity   Alcohol use: No    Alcohol/week: 0.0 standard  drinks of alcohol   Drug use: No   Sexual activity: Not Currently  Other Topics Concern   Not on file  Social History Narrative   Not on file   Social Determinants of Health   Financial Resource Strain: Low Risk  (05/18/2022)   Overall Financial Resource Strain (CARDIA)    Difficulty of Paying Living Expenses: Not hard at all  Food Insecurity: No Food Insecurity (05/18/2022)   Hunger Vital Sign    Worried About Running Out of Food in the Last Year: Never true    [place] in the Last Year: Never true  Transportation Needs: No Transportation Needs (05/18/2022)   PRAPARE - Hydrologist (Medical): No    Lack of Transportation (Non-Medical): No  Physical Activity: Inactive (05/18/2022)   Exercise Vital Sign    Days of Exercise per Week: 0 days    Minutes of Exercise per Session: 0 min  Stress: No Stress Concern Present (05/18/2022)   [place]    Feeling of Stress : Not at all  Social Connections: Moderately Integrated (05/18/2022)   Social Connection and Isolation Panel [NHANES]    Frequency of Communication with Friends and Family: More than three times a week    Frequency of Social Gatherings with Friends and Family: More than three times a week    Attends Religious Services: More than 4 times per year    Active Member of [employer] or Organizations: Yes    Attends Music therapist: More than 4 times per year    Marital Status: Divorced    Tobacco Counseling Counseling given: Not Answered   Clinical Intake:  Pre-visit preparation completed: Yes  Pain : No/denies pain     Nutritional Risks: None Diabetes: No  How often do you need to have someone help you when you read instructions, pamphlets, or other written materials from your doctor or pharmacy?: 1 - Never  Diabetic? no  Interpreter Needed?: No  Information entered by :: C.Deaira Leckey LPN   Activities of  Daily Living    05/18/2022    8:20 AM 04/25/2022    4:09 AM  In your present state of health, do you have any difficulty performing the following activities:  Hearing? 0 0  Vision? 0 0  Difficulty concentrating or making decisions? 0 0  Walking or climbing stairs? 0 0  Dressing or bathing? 0 0  Doing errands, shopping? 0 0  Preparing Food and eating ? N   Using the Toilet? N   In the past six months, have you accidently leaked  urine? Y   Comment If waits to long, coughs or sneezes   Do you have problems with loss of bowel control? N   Managing your Medications? N   Managing your Finances? N   Housekeeping or managing your Housekeeping? N     Patient Care Team: Tower, Wynelle Fanny, MD as PCP - General (Family Medicine) Kate Sable, MD as PCP - Cardiology (Cardiology)  Indicate any recent Medical Services you may have received from other than [facility] providers in the past year (date may be approximate).     Assessment:   This is a routine wellness examination for Barbara Kramer.  Hearing/Vision screen Hearing Screening - Comments:: No aids Vision Screening - Comments:: Glasses - [employer]  Dietary issues and exercise activities discussed: Current Exercise Habits: The patient does not participate in regular exercise at present   Goals Addressed             This Visit's Progress    Patient Stated       Lose 150lb.       Depression Screen    05/18/2022    8:16 AM 05/17/2022   11:01 AM 10/18/2021    9:39 AM 05/17/2021   10:34 AM 05/20/2020    8:14 AM 05/14/2020    2:02 PM 03/23/2020   10:45 AM  PHQ 2/9 Scores  PHQ - 2 Score 0 0 0 0 1 0 0  PHQ- 9 Score 0 0   6      Fall Risk    05/18/2022    8:19 AM 05/17/2022   11:01 AM 10/18/2021    9:39 AM 05/17/2021   10:33 AM 05/14/2020    2:02 PM  Fall Risk   Falls in the past year? 0 0 0 0 0  Number falls in past yr: 0 0     Injury with Fall? 0 0     Risk for fall due to : No Fall Risks No Fall Risks     Follow up Falls evaluation  completed;Falls prevention discussed Falls evaluation completed Falls evaluation completed Falls evaluation completed Falls evaluation completed    FALL RISK PREVENTION PERTAINING TO THE HOME:  Any stairs in or around the home? Yes  If so, are there any without handrails? No  Home free of loose throw rugs in walkways, pet beds, electrical cords, etc? Yes  Adequate lighting in your home to reduce risk of falls? Yes   ASSISTIVE DEVICES UTILIZED TO PREVENT FALLS:  Life alert? No  Use of a cane, walker or w/c? Yes  Grab bars in the bathroom? Yes  Shower chair or bench in shower? Yes  Elevated toilet seat or a handicapped toilet? No     Cognitive Function:    03/11/2019    9:50 AM  MMSE - Mini Mental State Exam  Orientation to time 5  Orientation to Place 5  Registration 3  Attention/ Calculation 5  Recall 3  Language- repeat 1        05/18/2022    8:21 AM  6CIT Screen  What Year? 0 points  What month? 0 points  What time? 0 points  Count back from 20 0 points  Months in reverse 0 points  Repeat phrase 0 points  Total Score 0 points    Immunizations Immunization History  Administered Date(s) Administered   Fluad Quad(high Dose 65+) 03/18/2019, 05/14/2020   Influenza,inj,Quad PF,6+ Mos 02/21/2018   PFIZER(Purple Top)SARS-COV-2 Vaccination 06/06/2019, 06/27/2019, 05/21/2020   Pneumococcal Conjugate-13 02/21/2018   Pneumococcal  Polysaccharide-23 03/18/2019   Td 08/15/2006   Tdap 04/22/2015   Zoster Recombinat (Shingrix) 01/17/2022, 04/24/2022    TDAP status: Up to date  Flu Vaccine status: Declined, Education has been provided regarding the importance of this vaccine but patient still declined. Advised may receive this vaccine at local pharmacy or Health Dept. Aware to provide a copy of the vaccination record if obtained from local pharmacy or Health Dept. Verbalized acceptance and understanding.  Pneumococcal vaccine status: Up to date  Covid-19 vaccine  status: Declined, Education has been provided regarding the importance of this vaccine but patient still declined. Advised may receive this vaccine at local pharmacy or Health Dept.or vaccine clinic. Aware to provide a copy of the vaccination record if obtained from local pharmacy or Health Dept. Verbalized acceptance and understanding.  Qualifies for Shingles Vaccine? No   Zostavax completed No   Shingrix Completed?: Yes [facility] [address] [address]  Screening Tests Health Maintenance  Topic Date Due   COVID-19 Vaccine (4 - 2023-24 season) 05/19/2022 (Originally 11/18/2021)   INFLUENZA VACCINE  06/18/2022 (Originally 10/18/2021)   MAMMOGRAM  05/18/2023 (Originally 05/12/2022)   Medicare Annual Wellness (AWV)  05/18/2023   DTaP/Tdap/Td (3 - Td or Tdap) 04/21/2025   COLONOSCOPY (Pts 45-15yr Insurance coverage will need to be confirmed)  07/14/2029   Pneumonia Vaccine 70 Years old  Completed   DEXA SCAN  Completed   Hepatitis C Screening  Completed   Zoster Vaccines- Shingrix  Completed   HPV VACCINES  Aged Out    Health Maintenance  There are no preventive care reminders to display for this patient.  Colorectal cancer screening: Type of screening: Colonoscopy. Completed 07/15/2019. Repeat every 10 years  Mammogram status: Completed 05/12/2021. Repeat every year pt has scheduled  Bone Density status: Completed 03/20/2019. Results reflect: Bone density results: NORMAL. Repeat every 5 years.  Lung Cancer Screening: (Low Dose CT Chest recommended if Age 70-80years, 30 pack-year currently smoking OR have quit w/in 15years.) does not qualify.   Lung Cancer Screening Referral: no  Additional Screening:  Hepatitis C Screening: does not qualify; Completed 10/19/2014  Vision Screening: Recommended annual ophthalmology exams for early detection of glaucoma and other disorders of the eye. Is the patient up to date with their annual eye exam?  Yes  Who is the provider or what is the name  of the office in which the patient attends annual eye exams? [facility]  If pt is not established with a provider, would they like to be referred to a provider to establish care? No .   Dental Screening: Recommended annual dental exams for proper oral hygiene  Community Resource Referral / Chronic Care Management: CRR required this visit?  No   CCM required this visit?  No      Plan:     I have personally reviewed and noted the following in the patient's chart:   Medical and social history Use of alcohol, tobacco or illicit drugs  Current medications and supplements including opioid prescriptions. Patient is not currently taking opioid prescriptions. Functional ability and status Nutritional status Physical activity Advanced directives List of other physicians Hospitalizations, surgeries, and ER visits in previous 12 months Vitals Screenings to include cognitive, depression, and falls Referrals and appointments  In addition, I have reviewed and discussed with patient certain preventive protocols, quality metrics, and best practice recommendations. A written personalized care plan for preventive services as well as general preventive health recommendations were provided to patient.     Roby Spalla M  Yisroel Ramming, LPN   [id number]   Nurse Notes: none

## 2022-05-18 NOTE — Telephone Encounter (Signed)
[facility] and said that [facility] that [facility] through her insurance and advised that she call her insurance and she said she did but that they couldn't override it and said they had contacted [place] to see if there was something temporary that could be prescribed until her plavix came in through the mail order. Patient said she is out of the medication and didn't know how important it was and if it was okay for her to go without. Call back is [phone]

## 2022-05-18 NOTE — Telephone Encounter (Signed)
Thank you ! - you are right, 3 weeks not 3 months She is free to go ahead and stop the plavix anyway Continue the asa however (please let pt know)  Can call the mail order and cancel the plavix if it is not too late Thanks so much

## 2022-05-18 NOTE — Progress Notes (Signed)
$'@Patient'i$  ID: Barbara Kramer, female    DOB: 1951/03/04, 72 y.o.   MRN: [id number]  Chief Complaint  Patient presents with   Follow-up    Referring provider: Abner Greenspan, MD  HPI: 72 yo female followed for sleep apnea  TEST/EVENTS :  HST on 08/10/2017 showed an AHI of 27.   05/18/2022 Follow up ; OSA  Patient presents for a follow-up visit for sleep apnea.  Patient was last seen in the office November 17, 2019.  She has a history of moderate obstructive sleep apnea.  Patient has been recommended start on CPAP.  But patient had been unable to remain on CPAP due to CPAP intolerance.  Patient was admitted earlier this month for a stroke.  And has been recommended to be reevaluated for sleep apnea and need for CPAP.  Patient does have ongoing snoring and restless sleep.  Some daytime sleepiness.   Allergies  Allergen Reactions   Calcium Carbonate Other (See Comments)    constipation   Penicillins Rash    Has patient had a PCN reaction causing immediate rash, facial/tongue/throat swelling, SOB or lightheadedness with hypotension: no Has patient had a PCN reaction causing severe rash involving mucus membranes or skin necrosis: no Has patient had a PCN reaction that required hospitalization: no Has patient had a PCN reaction occurring within the last 10 years: no If all of the above answers are "NO", then may proceed with Cephalosporin use.     Immunization History  Administered Date(s) Administered   Fluad Quad(high Dose 65+) 03/18/2019, 05/14/2020   Influenza,inj,Quad PF,6+ Mos 02/21/2018   PFIZER(Purple Top)SARS-COV-2 Vaccination 06/06/2019, 06/27/2019, 05/21/2020   Pneumococcal Conjugate-13 02/21/2018   Pneumococcal Polysaccharide-23 03/18/2019   Td 08/15/2006   Tdap 04/22/2015   Zoster Recombinat (Shingrix) 01/17/2022, 04/24/2022    Past Medical History:  Diagnosis Date   Anemia    Arthritis    Breast lump    left   Eczema    Family hx of colon cancer     Gallbladder problem    Hypertension    Kidney cysts    small, right 10/2002   Normal cardiac stress test 11/2008   exercise stress test normal but limited by exercise intolerance   Obesity    Shortness of breath    Sleep apnea     Tobacco History: Social History   Tobacco Use  Smoking Status Former   Packs/day: 0.25   Years: 5.00   Total pack years: 1.25   Types: Cigarettes   Quit date: 03/20/1976   Years since quitting: 46.1  Smokeless Tobacco Never   Counseling given: Not Answered   Outpatient Medications Prior to Visit  Medication Sig Dispense Refill   aspirin EC 81 MG tablet Take 1 tablet (81 mg total) by mouth daily. Swallow whole. 30 tablet 0   atorvastatin (LIPITOR) 20 MG tablet Take 1 tablet (20 mg total) by mouth daily. 90 tablet 3   chlorthalidone (HYGROTON) 25 MG tablet Take 0.5 tablets (12.5 mg total) by mouth daily. 45 tablet 3   clopidogrel (PLAVIX) 75 MG tablet Take 1 tablet (75 mg total) by mouth daily. 15 tablet 0   fluticasone (FLONASE) 50 MCG/ACT nasal spray Place 2 sprays into both nostrils as needed for allergies or rhinitis.     No facility-administered medications prior to visit.     Review of Systems:   Constitutional:   No  weight loss, night sweats,  Fevers, chills,  +fatigue, or  lassitude.  HEENT:  No headaches,  Difficulty swallowing,  Tooth/dental problems, or  Sore throat,                No sneezing, itching, ear ache, nasal congestion, post nasal drip,   CV:  No chest pain,  Orthopnea, PND, swelling in lower extremities, anasarca, dizziness, palpitations, syncope.   GI  No heartburn, indigestion, abdominal pain, nausea, vomiting, diarrhea, change in bowel habits, loss of appetite, bloody stools.   Resp: No shortness of breath with exertion or at rest.  No excess mucus, no productive cough,  No non-productive cough,  No coughing up of blood.  No change in color of mucus.  No wheezing.  No chest wall deformity  Skin: no rash or  lesions.  GU: no dysuria, change in color of urine, no urgency or frequency.  No flank pain, no hematuria   MS:  No joint pain or swelling.  No decreased range of motion.  No back pain.    Physical Exam  BP 120/64 (BP Location: Left Arm, Patient Position: Sitting, Cuff Size: Large)   Pulse 88   Temp 97.6 F (36.4 C) (Oral)   Ht '5\' 2"'$  (1.575 m)   Wt 279 lb (126.6 kg)   SpO2 98%   BMI 51.03 kg/m   GEN: A/Ox3; pleasant , NAD, well nourished     EACs-clear, TMs-wnl, NOSE-clear, THROAT-clear, no lesions, no postnasal drip or exudate noted.  Class III-IV MP airway  NECK:  Supple w/ fair ROM; no JVD; normal carotid impulses w/o bruits; no thyromegaly or nodules palpated; no lymphadenopathy.    RESP  Clear  P & A; w/o, wheezes/ rales/ or rhonchi. no accessory muscle use, no dullness to percussion  CARD:  RRR, no m/r/g, no peripheral edema, pulses intact, no cyanosis or clubbing.  GI:   Soft & nt; nml bowel sounds; no organomegaly or masses detected.   Musco: Warm bil, no deformities or joint swelling noted.   Neuro: alert, no focal deficits noted.    Skin: Warm, no lesions or rashes    Lab Results:   BNP No results found for: "BNP"  ProBNP No results found for: "PROBNP"  Imaging: ECHOCARDIOGRAM COMPLETE  Result Date: 04/26/2022    ECHOCARDIOGRAM REPORT   Patient Name:   Barbara Kramer Date of Exam: 04/26/2022 Medical Rec #:  [id number]          Height:       62.0 in Accession #:    [id number]         Weight:       283.3 lb Date of Birth:  22-Jun-1950          BSA:          2.217 m Patient Age:    59 years           BP:           151/76 mmHg Patient Gender: F                  HR:           70 bpm. Exam Location:  Inpatient Procedure: 2D Echo Indications:    stroke  History:        Patient has prior history of Echocardiogram examinations, most                 recent 05/21/2020. TIA; Risk Factors:Hypertension.  Sonographer:    Harvie Junior Referring Phys: [id number] [place]   Sonographer Comments: Patient is obese.  Image acquisition challenging due to patient body habitus. IMPRESSIONS  1. Left ventricular ejection fraction, by estimation, is 60 to 65%. The left ventricle has normal function. The left ventricle has no regional wall motion abnormalities. There is mild left ventricular hypertrophy. Left ventricular diastolic parameters are consistent with Grade I diastolic dysfunction (impaired relaxation).  2. Right ventricular systolic function is normal. The right ventricular size is normal. There is normal pulmonary artery systolic pressure. The estimated right ventricular systolic pressure is [id number] mmHg.  3. The mitral valve is normal in structure. Trivial mitral valve regurgitation.  4. The aortic valve is tricuspid. Aortic valve regurgitation is not visualized. No aortic stenosis is present.  5. The inferior vena cava is normal in size with greater than 50% respiratory variability, suggesting right atrial pressure of 3 mmHg. FINDINGS  Left Ventricle: Left ventricular ejection fraction, by estimation, is 60 to 65%. The left ventricle has normal function. The left ventricle has no regional wall motion abnormalities. The left ventricular internal cavity size was normal in size. There is  mild left ventricular hypertrophy. Left ventricular diastolic parameters are consistent with Grade I diastolic dysfunction (impaired relaxation). Right Ventricle: The right ventricular size is normal. No increase in right ventricular wall thickness. Right ventricular systolic function is normal. There is normal pulmonary artery systolic pressure. The tricuspid regurgitant velocity is 2.53 m/s, and  with an assumed right atrial pressure of 3 mmHg, the estimated right ventricular systolic pressure is [id number] mmHg. Left Atrium: Left atrial size was normal in size. Right Atrium: Right atrial size was normal in size. Pericardium: There is no evidence of pericardial effusion. Mitral Valve: The mitral valve is  normal in structure. Trivial mitral valve regurgitation. Tricuspid Valve: The tricuspid valve is normal in structure. Tricuspid valve regurgitation is trivial. Aortic Valve: The aortic valve is tricuspid. Aortic valve regurgitation is not visualized. No aortic stenosis is present. Aortic valve mean gradient measures 5.0 mmHg. Aortic valve peak gradient measures 9.1 mmHg. Aortic valve area, by VTI measures 2.38 cm. Pulmonic Valve: The pulmonic valve was not well visualized. Pulmonic valve regurgitation is trivial. Aorta: The aortic root is normal in size and structure. Venous: The inferior vena cava is normal in size with greater than 50% respiratory variability, suggesting right atrial pressure of 3 mmHg. IAS/Shunts: The interatrial septum was not well visualized.  LEFT VENTRICLE PLAX 2D LVIDd:         4.70 cm      Diastology LVIDs:         3.30 cm      LV e' medial:    6.20 cm/s LV PW:         0.90 cm      LV E/e' medial:  10.9 LV IVS:        1.10 cm      LV e' lateral:   8.59 cm/s LVOT diam:     1.90 cm      LV E/e' lateral: 7.9 LV SV:         78 LV SV Index:   35 LVOT Area:     2.84 cm                              3D Volume EF: LV Volumes (MOD)            3D EF:        64 % LV vol d, MOD A2C: 113.0 ml LV EDV:  163 ml LV vol d, MOD A4C: 118.0 ml LV ESV:       58 ml LV vol s, MOD A2C: 40.6 ml  LV SV:        105 ml LV vol s, MOD A4C: 43.3 ml LV SV MOD A2C:     72.4 ml LV SV MOD A4C:     118.0 ml LV SV MOD BP:      76.7 ml RIGHT VENTRICLE RV Basal diam:  3.00 cm RV Mid diam:    2.40 cm RV S prime:     16.50 cm/s TAPSE (M-mode): 3.0 cm LEFT ATRIUM             Index        RIGHT ATRIUM           Index LA diam:        3.70 cm 1.67 cm/m   RA Area:     12.30 cm LA Vol (A2C):   40.9 ml 18.45 ml/m  RA Volume:   25.40 ml  11.46 ml/m LA Vol (A4C):   45.9 ml 20.71 ml/m LA Biplane Vol: 44.2 ml 19.94 ml/m  AORTIC VALVE                     PULMONIC VALVE AV Area (Vmax):    2.44 cm      PV Vmax:          0.98 m/s AV  Area (Vmean):   2.25 cm      PV Peak grad:     3.8 mmHg AV Area (VTI):     2.38 cm      PR End Diast Vel: 4.24 msec AV Vmax:           151.00 cm/s AV Vmean:          104.000 cm/s AV VTI:            0.327 m AV Peak Grad:      9.1 mmHg AV Mean Grad:      5.0 mmHg LVOT Vmax:         130.00 cm/s LVOT Vmean:        82.700 cm/s LVOT VTI:          0.275 m LVOT/AV VTI ratio: 0.84  AORTA Ao Root diam: 2.90 cm Ao Asc diam:  2.90 cm MITRAL VALVE               TRICUSPID VALVE MV Area (PHT): 2.99 cm    TR Peak grad:   25.6 mmHg MV Decel Time: 254 msec    TR Vmax:        253.00 cm/s MR Peak grad: 90.8 mmHg MR Vmax:      476.50 cm/s  SHUNTS MV E velocity: 67.70 cm/s  Systemic VTI:  0.28 m                            Systemic Diam: 1.90 cm Oswaldo Milian MD Electronically signed by Oswaldo Milian MD Signature Date/Time: 04/26/2022/11:23:00 AM    Final    MR BRAIN WO CONTRAST  Result Date: 04/25/2022 CLINICAL DATA:  72 year old female with headache, TIA. Near occlusion of the right ICA terminus, proximal right MCA. EXAM: MRI HEAD WITHOUT CONTRAST TECHNIQUE: Multiplanar, multiecho pulse sequences of the brain and surrounding structures were obtained without intravenous contrast. COMPARISON:  CT head and CTA head and neck yesterday. FINDINGS: Brain: Small round 3-4 mm focus  of diffusion restriction in the ventral right motor gyrus on series 5, image 96, near upper extremity representation area. Similar small postcentral right parietal focus of restricted diffusion on series 5, image 94., and confluent roughly 1.5 cm area of cortical restricted diffusion along the posterior right temporal lobe best seen on series 5, images 73-75. Subtle associated T2 and FLAIR hyperintense cytotoxic edema. No convincing hemorrhage. No contralateral left hemisphere or posterior fossa restricted diffusion. No midline shift, mass effect, evidence of mass lesion, ventriculomegaly, extra-axial collection or acute intracranial hemorrhage.  Cervicomedullary junction and pituitary are within normal limits. Scattered and patchy bilateral white matter T2 and FLAIR hyperintensity, moderate for age and slightly greater on the right side. No chronic cortical encephalomalacia or definite chronic cerebral blood products. Deep gray matter nuclei, brainstem and cerebellum are negative. Vascular: Major intracranial vascular flow voids are preserved. Skull and upper cervical spine: Partially visible cervical spine disc and endplate degeneration. Background bone marrow signal within normal limits. Sinuses/Orbits: Negative orbits. Maxillary alveolar recess mucosal thickening is mild. Circumscribed 13-16 mm cyst of the right nasal labial fold on series 10, image 3 and series 11, image 2 has a benign appearance. Other: Mastoids are well aerated. Grossly normal visible internal auditory structures. Visualized scalp soft tissues are within normal limits. IMPRESSION: 1. Positive for Acute Right MCA territory ischemia. Several small cortical and/or subcortical infarcts involving the lateral posterior right temporal lobe cortex, superior perirolandic area. No associated hemorrhage or mass effect. 2. No other acute intracranial abnormality. Underlying moderate for age cerebral white matter signal changes, most commonly due to chronic small vessel disease. 3. Benign appearing roughly 16 mm cyst of the right face nasolabial junction (series 11, image 2). Electronically Signed   By: Genevie Ann M.D.   On: 04/25/2022 06:17   CT ANGIO HEAD NECK W WO CM  Result Date: 04/24/2022 CLINICAL DATA:  Initial evaluation for acute TIA. EXAM: CT ANGIOGRAPHY HEAD AND NECK TECHNIQUE: Multidetector CT imaging of the head and neck was performed using the standard protocol during bolus administration of intravenous contrast. Multiplanar CT image reconstructions and MIPs were obtained to evaluate the vascular anatomy. Carotid stenosis measurements (when applicable) are obtained utilizing NASCET  criteria, using the distal internal carotid diameter as the denominator. RADIATION DOSE REDUCTION: This exam was performed according to the departmental dose-optimization program which includes automated exposure control, adjustment of the mA and/or kV according to patient size and/or use of iterative reconstruction technique. CONTRAST:  48m OMNIPAQUE IOHEXOL 350 MG/ML SOLN COMPARISON:  Prior CT from earlier the same day. FINDINGS: CTA NECK FINDINGS Aortic arch: Visualized aortic arch normal caliber with sooner branch pattern. Mild atheromatous change about the arch itself. No stenosis about the origin the great vessels. Right carotid system: Right common and internal carotid arteries are tortuous without dissection or stenosis. Left carotid system: Left common and internal carotid arteries are tortuous without dissection or stenosis. Vertebral arteries: Both vertebral arteries arise from subclavian arteries. No proximal subclavian artery stenosis. Vertebral arteries are patent without dissection or stenosis. Skeleton: Moderate to advanced degenerative spondylosis throughout the visualized cervicothoracic spine. No worrisome osseous lesions. Other neck: No other acute soft tissue abnormality in neck. Upper chest: Visualized upper chest demonstrates no acute finding. Review of the MIP images confirms the above findings CTA HEAD FINDINGS Anterior circulation: Atheromatous change within the carotid siphons with associated moderate to severe stenoses about the para clinoid segments, right worse than left. A1 segments patent bilaterally. Normal anterior communicating artery complex. Anterior  cerebral arteries patent without stenosis. Left M1 segment and distal left MCA branches are well perfused and patent. On the right, there is a severe near occlusive stenosis at the right ICA terminus/proximal right M1 segment (series 6, image 104). Right M1 segment patent distally. No proximal MCA branch occlusion or high-grade  stenosis. Distal right MCA branches well perfused. Posterior circulation: Both V4 segments patent without stenosis. Left PICA patent. Right PICA not well seen. Short-segment moderate proximal basilar stenosis (series 6, image 120). Basilar otherwise patent to its distal aspect. Superior cerebral arteries patent bilaterally. Both PCAs primarily supplied via the basilar. Focal severe left P2 stenosis (series 7, image 22). PCAs otherwise patent to their distal aspects without significant stenosis. Venous sinuses: Grossly patent allowing for timing the contrast bolus. Anatomic variants: None significant.  No aneurysm. Review of the MIP images confirms the above findings IMPRESSION: 1. Negative CTA for acute large vessel occlusion. 2. Severe near occlusive stenosis at the right ICA terminus/proximal right M1 segment. 3. Atheromatous change within the carotid siphons with associated moderate to severe stenoses about the para clinoid segments, right worse than left. 4. Short-segment moderate proximal basilar stenosis, with additional focal severe left P2 stenosis. 5. Diffuse tortuosity of the major arterial vasculature of the neck, suggesting chronic underlying hypertension. Electronically Signed   By: Jeannine Boga M.D.   On: 04/24/2022 23:55   CT HEAD WO CONTRAST  Result Date: 04/24/2022 CLINICAL DATA:  Headaches, twisting of the mouth EXAM: CT HEAD WITHOUT CONTRAST TECHNIQUE: Contiguous axial images were obtained from the base of the skull through the vertex without intravenous contrast. RADIATION DOSE REDUCTION: This exam was performed according to the departmental dose-optimization program which includes automated exposure control, adjustment of the mA and/or kV according to patient size and/or use of iterative reconstruction technique. COMPARISON:  None Available. FINDINGS: Brain: No acute territorial infarction, hemorrhage or intracranial mass. Mild white matter hypodensity. The ventricles are nonenlarged.  Vascular: No hyperdense vessels.  Carotid vascular calcification Skull: Normal. Negative for fracture or focal lesion. Sinuses/Orbits: No acute finding. Other: None IMPRESSION: 1. No CT evidence for acute intracranial abnormality. 2. Mild small vessel ischemic changes of the white matter. Electronically Signed   By: Donavan Foil M.D.   On: 04/24/2022 19:24          No data to display          No results found for: "NITRICOXIDE"      Assessment & Plan:   No problem-specific Assessment & Plan notes found for this encounter.     Rexene Edison, NP 05/18/2022

## 2022-05-18 NOTE — Telephone Encounter (Signed)
I don't have an alternative- she may have to pay out of pocket for a few pills   If possible please call her mail order and tell them this is an emergency and see how many days they expect delivery to take  Will cc Ria Comment to see if she has any ideas   She is on plavix and asa for 90 days after cva/tia  Thanks

## 2022-05-22 NOTE — Assessment & Plan Note (Signed)
History of moderate obstructive sleep apnea, snoring, daytime sleepiness and recent stroke patient will need an in lab split-night sleep study to reevaluate if ongoing sleep apnea is present and need for nocturnal CPAP.  Plan  Patient Instructions  Set up for Split night sleep study  Work on healthy weight  Do not drive if sleepy  Work on healthy sleep regimen  Follow up with Dr. Halford Chessman  Or Mikenna Bunkley in 6 weeks to discuss results and treatment plan

## 2022-05-23 NOTE — Progress Notes (Signed)
Reviewed and agree with assessment/plan.   Chesley Mires, MD [facility] Pulmonary/Critical Care 05/23/2022, 7:53 AM Pager:  [phone]

## 2022-05-23 NOTE — Progress Notes (Unsigned)
    Taesha Goodell T. Raynold Blankenbaker, MD, [place] [facility] [place], [address]  Phone: [phone]  FAX: [phone]  Barbara Kramer - 72 y.o. female  MRN [id number]  Date of Birth: [birth year]  Date: 05/24/2022  PCP: Abner Greenspan, MD  Referral: Abner Greenspan, MD  No chief complaint on file.  Subjective:   Barbara Kramer is a 72 y.o. very pleasant female patient with There is no height or weight on file to calculate BMI. who presents with the following:  She is a very pleasant 72 year old lady, she presents with some ongoing right-sided shoulder pain.    Review of Systems is noted in the HPI, as appropriate  Objective:   There were no vitals taken for this visit.  GEN: No acute distress; alert,appropriate. PULM: Breathing comfortably in no respiratory distress PSYCH: Normally interactive.   Laboratory and Imaging Data:  Assessment and Plan:   ***

## 2022-05-24 ENCOUNTER — Ambulatory Visit (INDEPENDENT_AMBULATORY_CARE_PROVIDER_SITE_OTHER): Payer: Medicare HMO | Admitting: Family Medicine

## 2022-05-24 ENCOUNTER — Encounter: Payer: Self-pay | Admitting: Family Medicine

## 2022-05-24 VITALS — BP 120/60 | HR 77 | Temp 97.6°F | Ht 62.0 in | Wt 281.1 lb

## 2022-05-24 DIAGNOSIS — M7541 Impingement syndrome of right shoulder: Secondary | ICD-10-CM | POA: Diagnosis not present

## 2022-05-24 MED ORDER — PREDNISONE 20 MG PO TABS: ORAL_TABLET | ORAL | 0 refills | Status: DC

## 2022-06-20 ENCOUNTER — Telehealth: Payer: Self-pay | Admitting: Adult Health

## 2022-06-22 DIAGNOSIS — R200 Anesthesia of skin: Secondary | ICD-10-CM | POA: Diagnosis not present

## 2022-06-22 DIAGNOSIS — Z7982 Long term (current) use of aspirin: Secondary | ICD-10-CM | POA: Diagnosis not present

## 2022-06-22 DIAGNOSIS — R202 Paresthesia of skin: Secondary | ICD-10-CM | POA: Diagnosis not present

## 2022-06-22 DIAGNOSIS — R29810 Facial weakness: Secondary | ICD-10-CM | POA: Diagnosis not present

## 2022-06-22 DIAGNOSIS — G459 Transient cerebral ischemic attack, unspecified: Secondary | ICD-10-CM | POA: Diagnosis not present

## 2022-06-22 DIAGNOSIS — I10 Essential (primary) hypertension: Secondary | ICD-10-CM | POA: Diagnosis not present

## 2022-06-22 DIAGNOSIS — Z8673 Personal history of transient ischemic attack (TIA), and cerebral infarction without residual deficits: Secondary | ICD-10-CM | POA: Diagnosis not present

## 2022-06-23 ENCOUNTER — Telehealth: Payer: Self-pay | Admitting: Family Medicine

## 2022-06-23 NOTE — Telephone Encounter (Signed)
Pt notified of Dr. Tower's comments and recommendations and verbalized understanding  

## 2022-06-23 NOTE — Telephone Encounter (Signed)
Patient called our office asking if Dr. Milinda Antis could take a look at her labs from ed visit, stated she noticed her RBC is high and had some questions regarding this. Would like to know if Dr. Milinda Antis would like for her to start taking her plavix (clopidogrel) again and states she has some leftover. Patient would like a call back regarding this, please advise at [phone].

## 2022-06-23 NOTE — Telephone Encounter (Signed)
I'm not concerned about the rbc but we will review more carefully at f/u  Do start plavix if she has some and take until I see her   Let us know if she has any more episodes of facial droop  Thanks for checking in

## 2022-06-26 ENCOUNTER — Ambulatory Visit (INDEPENDENT_AMBULATORY_CARE_PROVIDER_SITE_OTHER): Payer: Medicare HMO | Admitting: Family Medicine

## 2022-06-26 ENCOUNTER — Encounter: Payer: Self-pay | Admitting: Family Medicine

## 2022-06-26 VITALS — BP 134/72 | HR 73 | Temp 97.8°F | Ht 62.0 in | Wt 280.4 lb

## 2022-06-26 DIAGNOSIS — E7800 Pure hypercholesterolemia, unspecified: Secondary | ICD-10-CM

## 2022-06-26 DIAGNOSIS — I6529 Occlusion and stenosis of unspecified carotid artery: Secondary | ICD-10-CM | POA: Diagnosis not present

## 2022-06-26 DIAGNOSIS — Z8673 Personal history of transient ischemic attack (TIA), and cerebral infarction without residual deficits: Secondary | ICD-10-CM

## 2022-06-26 DIAGNOSIS — G4733 Obstructive sleep apnea (adult) (pediatric): Secondary | ICD-10-CM

## 2022-06-26 DIAGNOSIS — I10 Essential (primary) hypertension: Secondary | ICD-10-CM | POA: Diagnosis not present

## 2022-06-26 NOTE — Assessment & Plan Note (Signed)
Sleep study planned this mo  Wants to work on wt loss

## 2022-06-26 NOTE — Patient Instructions (Addendum)
Continue the plavix until you see the neurologist  Continue other medicines  Watch for any signs of stroke including facial droop or weakness or speech trouble   Take care of yourself also    Try to get most of your carbohydrates from produce (with the exception of white potatoes)  Eat less bread/pasta/rice/snack foods/cereals/sweets and other items from the middle of the grocery store (processed carbs)  Once things are stable - water aerobics and pedaling are gooe

## 2022-06-26 NOTE — Assessment & Plan Note (Signed)
?   [place] while [place] of state  Rev records and CT/lab from ER in [place]  Reviewed hospital records, lab results and studies in detail   Brief- 1 min of facial droop w/o other symptoms  It resolved  Nl exam and vitals today Good bp and lipid Getting sleep study this mo   Inst to continue both plavix and asa until neuro appt this week

## 2022-06-26 NOTE — Progress Notes (Signed)
Subjective:    Patient ID: [name], female    DOB: 16-Jun-1950, 72 y.o.   MRN: [id number]  HPI Pt presents for ER fu for L facial numbness/ droop  Wt Readings from Last 3 Encounters:  06/26/22 280 lb 6 oz (127.2 kg)  05/24/22 281 lb 2 oz (127.5 kg)  05/18/22 279 lb (126.6 kg)   51.28 kg/m  Vitals:   06/26/22 0755  BP: 134/72  Pulse: 73  Temp: 97.8 F (36.6 C)  SpO2: 98%    Was seen in ER in [place] on   Noted L sided facial droop that lasted a few minutes and then resolved  ? Side/ left side seemed higher  Happened when she was working on her computer/sitting still - then called 911   No headache  No dizziness   Case was disc with neuro who felt no addn meds were needed  H/o TIA in feb- was hosp for 2 d then d/c with asa and 21 d of plavix   Labs were negative  CT head was neg   Reviewed her hosp in feb  Plan was f/u with neuro in April D r[place]enum[place] -has appt this week  Was also ref to pulm for OSA at that time   (has a sleep study planned on 4/20)  BP Readings from Last 3 Encounters:  06/26/22 134/72  05/24/22 120/60  05/18/22 120/64   Pulse Readings from Last 3 Encounters:  06/26/22 73  05/24/22 77  05/18/22 88    Lab Results  Component Value Date   CHOL 111 05/08/2022   HDL 50.60 05/08/2022   LDLCALC 48 05/08/2022   TRIG 64.0 05/08/2022   CHOLHDL 2 05/08/2022   Neuro f/u is this week   Feeling fine since   No chest pain or sob  When lying on her side she can hear heart beating in her ears  Tries to avoid caffeine   2d Echo was reassuring   She is eating much better overall  Only had fried chicken once since her stroke   Patient Active Problem List   Diagnosis Date Noted   Skin lesions 05/17/2022   Hyperlipidemia 05/03/2022   Intracranial carotid stenosis 04/25/2022   TIA (transient ischemic attack) 04/24/2022   Sciatica of left side 01/04/2022   Mobility impaired 08/01/2021   Head pain 06/28/2021   Vulvar cyst  01/25/2021   Rash of neck 08/02/2020   Essential hypertension 06/30/2020   Medicare annual wellness visit, subsequent 05/14/2020   Unilateral primary osteoarthritis, left knee 05/10/2020   Morbid obesity with body mass index (BMI) of 50.0 to 59.9 in adult 05/10/2020   Family history of colon cancer in mother    Benign neoplasm of colon    OSA (obstructive sleep apnea) 01/29/2018   Estrogen deficiency 01/29/2018   Allergic rhinitis 05/15/2017   Screening mammogram, encounter for 10/11/2016   Prediabetes 12/15/2014   Osteoarthritis of left knee 10/16/2013   Colon cancer screening 11/20/2012   Routine gynecological examination 11/17/2011   Other screening mammogram 11/17/2011   Right shoulder pain 06/05/2011   Low back pain 10/18/2010   Routine general medical examination at a health care facility 10/06/2010   ACANTHOSIS NIGRICANS 01/27/2008   Eczema 08/02/2006   Past Medical History:  Diagnosis Date   Anemia    Arthritis    Breast lump    left   Eczema    Family hx of colon cancer    Gallbladder problem    Hypertension  Kidney cysts    small, right 10/2002   Normal cardiac stress test 11/2008   exercise stress test normal but limited by exercise intolerance   Obesity    Shortness of breath    Sleep apnea    Past Surgical History:  Procedure Laterality Date   CHOLECYSTECTOMY  05/2002   [place]- gallstones, small renal cyst 05/2002   COLONOSCOPY WITH PROPOFOL N/A 07/15/2019   Procedure: COLONOSCOPY WITH PROPOFOL;  Surgeon: Benancio Deeds, MD;  Location: [facility];  Service: Gastroenterology;  Laterality: N/A;   FOOT SURGERY  11/1998   bunion   PARTIAL HYSTERECTOMY  1998   fibroids, ovaries remain   POLYPECTOMY  07/15/2019   Procedure: POLYPECTOMY;  Surgeon: Benancio Deeds, MD;  Location: [facility];  Service: Gastroenterology;;   Social History   Tobacco Use   Smoking status: Former    Packs/day: 0.25    Years: 5.00    Additional pack years: 0.00     Total pack years: 1.25    Types: Cigarettes    Quit date: 03/20/1976    Years since quitting: 46.2   Smokeless tobacco: Never  Substance Use Topics   Alcohol use: No    Alcohol/week: 0.0 standard drinks of alcohol   Drug use: No   Family History  Problem Relation Age of Onset   Colon cancer Mother        87's   Stomach cancer Mother    Cancer Mother    Lung cancer Father        smoker   Cancer Father    Hypertension Sister    Dementia Sister    Hypertension Sister    Stroke Maternal Grandmother    Heart attack Maternal Grandfather    Stroke Paternal Grandmother    Colon polyps Daughter    Allergies  Allergen Reactions   Calcium Carbonate Other (See Comments)    constipation   Penicillins Rash    Has patient had a PCN reaction causing immediate rash, facial/tongue/throat swelling, SOB or lightheadedness with hypotension: no Has patient had a PCN reaction causing severe rash involving mucus membranes or skin necrosis: no Has patient had a PCN reaction that required hospitalization: no Has patient had a PCN reaction occurring within the last 10 years: no If all of the above answers are "NO", then may proceed with Cephalosporin use.    Current Outpatient Medications on File Prior to Visit  Medication Sig Dispense Refill   aspirin EC 81 MG tablet Take 1 tablet (81 mg total) by mouth daily. Swallow whole. 30 tablet 0   atorvastatin (LIPITOR) 20 MG tablet Take 1 tablet (20 mg total) by mouth daily. 90 tablet 3   chlorthalidone (HYGROTON) 25 MG tablet Take 0.5 tablets (12.5 mg total) by mouth daily. 45 tablet 3   clopidogrel (PLAVIX) 75 MG tablet Take 75 mg by mouth daily.     fluticasone (FLONASE) 50 MCG/ACT nasal spray Place 2 sprays into both nostrils as needed for allergies or rhinitis.     No current facility-administered medications on file prior to visit.     Review of Systems  Constitutional:  Positive for fatigue. Negative for activity change, appetite change, fever  and unexpected weight change.  HENT:  Negative for congestion, ear pain, rhinorrhea, sinus pressure and sore throat.   Eyes:  Negative for pain, redness and visual disturbance.  Respiratory:  Negative for cough, shortness of breath and wheezing.   Cardiovascular:  Negative for chest pain and palpitations.  Gastrointestinal:  Negative for abdominal pain, blood in stool, constipation and diarrhea.  Endocrine: Negative for polydipsia and polyuria.  Genitourinary:  Negative for dysuria, frequency and urgency.  Musculoskeletal:  Negative for arthralgias, back pain and myalgias.  Skin:  Negative for pallor and rash.  Allergic/Immunologic: Negative for environmental allergies.  Neurological:  Negative for dizziness, tremors, seizures, syncope, facial asymmetry, speech difficulty, weakness, light-headedness, numbness and headaches.       Her facial droop was brief and now entirely gone   Hematological:  Negative for adenopathy. Does not bruise/bleed easily.  Psychiatric/Behavioral:  Negative for decreased concentration and dysphoric mood. The patient is not nervous/anxious.        Objective:   Physical Exam Constitutional:      General: She is not in acute distress.    Appearance: Normal appearance. She is well-developed. She is obese. She is not ill-appearing or diaphoretic.  HENT:     Head: Normocephalic and atraumatic.     Comments: No facial droop at all Eyes:     Conjunctiva/sclera: Conjunctivae normal.     Pupils: Pupils are equal, round, and reactive to light.  Neck:     Thyroid: No thyromegaly.     Vascular: No carotid bruit or JVD.  Cardiovascular:     Rate and Rhythm: Normal rate and regular rhythm.     Heart sounds: Normal heart sounds.     No gallop.  Pulmonary:     Effort: Pulmonary effort is normal. No respiratory distress.     Breath sounds: Normal breath sounds. No wheezing or rales.  Abdominal:     General: There is no distension or abdominal bruit.     Palpations:  Abdomen is soft.  Musculoskeletal:     Cervical back: Normal range of motion and neck supple.     Right lower leg: No edema.     Left lower leg: No edema.  Lymphadenopathy:     Cervical: No cervical adenopathy.  Skin:    General: Skin is warm and dry.     Coloration: Skin is not pale.     Findings: No rash.  Neurological:     Mental Status: She is alert.     Cranial Nerves: Cranial nerves 2-12 are intact. No cranial nerve deficit, dysarthria or facial asymmetry.     Sensory: Sensation is intact.     Motor: No weakness, tremor, abnormal muscle tone, seizure activity or pronator drift.     Coordination: Romberg sign negative. Coordination normal.     Gait: Gait is intact.     Deep Tendon Reflexes: Reflexes are normal and symmetric. Reflexes normal.  Psychiatric:        Attention and Perception: Attention normal.        Mood and Affect: Mood normal.        Speech: Speech normal.        Behavior: Behavior normal.        Cognition and Memory: Cognition and memory normal.     Comments: Mentally sharp          Assessment & Plan:   Problem List Items Addressed This Visit       Cardiovascular and Mediastinum   Essential hypertension    bp in fair control at this time  BP Readings from Last 1 Encounters:  06/26/22 134/72  No changes needed Most recent labs reviewed  Disc lifstyle change with low sodium diet and exercise  Continues chlorthalidone 12.5 mg daily and tolerates it well  Intracranial carotid stenosis    Pt had another ? Tia event while out of town  Has neuro appt this week Inst to stay on both asa and plavix until that time        Respiratory   OSA (obstructive sleep apnea)    Sleep study planned this mo  Wants to work on wt loss        Other   History of TIA (transient ischemic attack) - Primary    ? Another event while [place] of state  Rev records and CT/lab from [place] in [place]  Reviewed hospital records, lab results and studies in detail   Brief-  1 min of facial droop w/o other symptoms  It resolved  Nl exam and vitals today Good bp and lipid Getting sleep study this mo   Inst to continue both plavix and asa until neuro appt this week      Hyperlipidemia    Disc goals for lipids and reasons to control them Rev last labs with pt Rev low sat fat diet in detail LDL is down to 48 with atorvastatin 20 mg daily  Goal is less than 70 in light of TIA history

## 2022-06-26 NOTE — Assessment & Plan Note (Signed)
bp in fair control at this time  BP Readings from Last 1 Encounters:  06/26/22 134/72   No changes needed Most recent labs reviewed  Disc lifstyle change with low sodium diet and exercise  Continues chlorthalidone 12.5 mg daily and tolerates it well

## 2022-06-26 NOTE — Assessment & Plan Note (Signed)
Pt had another ? Tia event while out of town  Has neuro appt this week Inst to stay on both asa and plavix until that time

## 2022-06-26 NOTE — Assessment & Plan Note (Signed)
Disc goals for lipids and reasons to control them Rev last labs with pt Rev low sat fat diet in detail LDL is down to 48 with atorvastatin 20 mg daily  Goal is less than 70 in light of TIA history  

## 2022-06-28 ENCOUNTER — Encounter: Payer: Self-pay | Admitting: Diagnostic Neuroimaging

## 2022-06-28 ENCOUNTER — Ambulatory Visit: Payer: Medicare HMO | Admitting: Diagnostic Neuroimaging

## 2022-06-28 VITALS — BP 143/76 | HR 65 | Ht 62.0 in | Wt 279.0 lb

## 2022-06-28 DIAGNOSIS — I63411 Cerebral infarction due to embolism of right middle cerebral artery: Secondary | ICD-10-CM | POA: Diagnosis not present

## 2022-06-28 DIAGNOSIS — G459 Transient cerebral ischemic attack, unspecified: Secondary | ICD-10-CM

## 2022-06-28 MED ORDER — CLOPIDOGREL BISULFATE 75 MG PO TABS: 75.0000 mg | ORAL_TABLET | Freq: Every day | ORAL | 4 refills | Status: DC

## 2022-06-28 NOTE — Progress Notes (Signed)
[facility]  PATIENT: Barbara Kramer DOB: 10-31-1950  REFERRING CLINICIAN: Glade Lloyd, MD HISTORY FROM: patient  REASON FOR VISIT: new consult    HISTORICAL  CHIEF COMPLAINT:  Chief Complaint  Patient presents with   Cerebrovascular Accident    Rm 7 alone Pt is well, reports her facial weakness has resolved. Occasional slurred speech, no other concerns.     HISTORY OF PRESENT ILLNESS:   UPDATE (06/28/22, VRP): 72 year old female with here for hospital stroke follow-up.  Patient doing well.  She is tolerating her medications.  Symptoms have resolved until 06/22/22 when she had recurrence of left facial numbness and slurred speech for 10 minutes.  She was in [place] at the time and had a evaluation with CT scan which was unremarkable.  She had completed her aspirin and Plavix dual therapy for 21 days after initial stroke, and this was resumed after this recurrent TIA 1 week ago.  This recurrent TIA 1 week ago.   PRIOR HPI (Dr. Viviann Spare, 04/26/22): "Stroke:  Punctate strokes in the right MCA territory Etiology:  significant intracranial atherosclerotic disease Code Stroke CT head No acute abnormality.  CTA head & neck Severe near occlusive stenosis at the right ICA terminus/proximal right M1 segment. Atheromatous change within the carotid siphons with associated moderate to severe stenoses about the para clinoid segments, right worse than left. Short-segment moderate proximal basilar stenosis, with additional focal severe left P2 stenosis. Diffuse tortuosity of the major arterial vasculature of the neck, suggesting chronic underlying hypertension. MRI  Positive for Acute Right MCA territory ischemia. Several small cortical and/or subcortical infarcts involving the lateral posterior right temporal lobe cortex, superior perirolandic area 2D Echo EF 60-65% LDL 80 HgbA1c 4.7 VTE prophylaxis - lovenox       Diet    Diet Heart Room service appropriate? Yes; Fluid  consistency: Thin        No antithrombotic prior to admission, now on aspirin 81 mg daily and clopidogrel 75 mg daily.  Therapy recommendations: No follow up Disposition:  pending   Hypertension Home meds:  chlorthalidone- does not take consistently Stable Permissive hypertension (OK if < 220/120) but gradually normalize in 5-7 days Long-term BP goal normotensive Agreeable to log blood pressures for PCP   Hyperlipidemia LDL 80, goal < 70 Add Atorvastatin 20mg   Continue statin at discharge   Other Stroke Risk Factors Advanced Age >/= 53  Obesity, Body mass index is 51.81 kg/m., BMI >/= 30 associated with increased stroke risk, recommend weight loss, diet and exercise as appropriate"     REVIEW OF SYSTEMS: Full 14 system review of systems performed and negative with exception of: as per HPI.  ALLERGIES: Allergies  Allergen Reactions   Calcium Carbonate Other (See Comments)    constipation   Penicillins Rash    Has patient had a PCN reaction causing immediate rash, facial/tongue/throat swelling, SOB or lightheadedness with hypotension: no Has patient had a PCN reaction causing severe rash involving mucus membranes or skin necrosis: no Has patient had a PCN reaction that required hospitalization: no Has patient had a PCN reaction occurring within the last 10 years: no If all of the above answers are "NO", then may proceed with Cephalosporin use.     HOME MEDICATIONS: Outpatient Medications Prior to Visit  Medication Sig Dispense Refill   aspirin EC 81 MG tablet Take 1 tablet (81 mg total) by mouth daily. Swallow whole. 30 tablet 0   atorvastatin (LIPITOR) 20 MG tablet Take 1  tablet (20 mg total) by mouth daily. 90 tablet 3   chlorthalidone (HYGROTON) 25 MG tablet Take 0.5 tablets (12.5 mg total) by mouth daily. 45 tablet 3   fluticasone (FLONASE) 50 MCG/ACT nasal spray Place 2 sprays into both nostrils as needed for allergies or rhinitis.     clopidogrel (PLAVIX) 75 MG  tablet Take 75 mg by mouth daily.     No facility-administered medications prior to visit.    PAST MEDICAL HISTORY: Past Medical History:  Diagnosis Date   Anemia    Arthritis    Breast lump    left   Eczema    Family hx of colon cancer    Gallbladder problem    Hypertension    Kidney cysts    small, right 10/2002   Normal cardiac stress test 11/2008   exercise stress test normal but limited by exercise intolerance   Obesity    Shortness of breath    Sleep apnea     PAST SURGICAL HISTORY: Past Surgical History:  Procedure Laterality Date   CHOLECYSTECTOMY  05/2002   [place]- gallstones, small renal cyst 05/2002   COLONOSCOPY WITH PROPOFOL N/A 07/15/2019   Procedure: COLONOSCOPY WITH PROPOFOL;  Surgeon: Benancio Deeds, MD;  Location: [facility];  Service: Gastroenterology;  Laterality: N/A;   FOOT SURGERY  11/1998   bunion   PARTIAL HYSTERECTOMY  1998   fibroids, ovaries remain   POLYPECTOMY  07/15/2019   Procedure: POLYPECTOMY;  Surgeon: Benancio Deeds, MD;  Location: [facility];  Service: Gastroenterology;;    FAMILY HISTORY: Family History  Problem Relation Age of Onset   Colon cancer Mother        57's   Stomach cancer Mother    Cancer Mother    Lung cancer Father        smoker   Cancer Father    Hypertension Sister    Dementia Sister    Hypertension Sister    Stroke Maternal Grandmother    Heart attack Maternal Grandfather    Stroke Paternal Grandmother    Colon polyps Daughter     SOCIAL HISTORY: Social History   Socioeconomic History   Marital status: Single    Spouse name: Not on file   Number of children: 2   Years of education: Not on file   Highest education level: Some college, no degree  Occupational History   Occupation: Retired    Associate Professor: [employer]  Tobacco Use   Smoking status: Former    Packs/day: 0.25    Years: 5.00    Additional pack years: 0.00    Total pack years: 1.25    Types: Cigarettes    Quit date:  03/20/1976    Years since quitting: 46.3   Smokeless tobacco: Never  Substance and Sexual Activity   Alcohol use: No    Alcohol/week: 0.0 standard drinks of alcohol   Drug use: No   Sexual activity: Not Currently  Other Topics Concern   Not on file  Social History Narrative   Not on file   Social Determinants of Health   Financial Resource Strain: Low Risk  (06/23/2022)   Overall Financial Resource Strain (CARDIA)    Difficulty of Paying Living Expenses: Not hard at all  Food Insecurity: No Food Insecurity (06/23/2022)   Hunger Vital Sign    Worried About Running Out of Food in the Last Year: Never true    Ran Out of Food in the Last Year: Never true  Transportation Needs:  No Transportation Needs (06/23/2022)   PRAPARE - Administrator, Civil Service (Medical): No    Lack of Transportation (Non-Medical): No  Physical Activity: Inactive (06/23/2022)   Exercise Vital Sign    Days of Exercise per Week: 0 days    Minutes of Exercise per Session: 0 min  Stress: No Stress Concern Present (06/23/2022)   [employer] of Occupational Health - Occupational Stress Questionnaire    Feeling of Stress : Not at all  Social Connections: Moderately Integrated (06/23/2022)   Social Connection and Isolation Panel [NHANES]    Frequency of Communication with Friends and Family: More than three times a week    Frequency of Social Gatherings with Friends and Family: Twice a week    Attends Religious Services: More than 4 times per year    Active Member of [employer] or Organizations: Yes    Attends Banker Meetings: More than 4 times per year    Marital Status: Divorced  Intimate Partner Violence: Not At Risk (05/18/2022)   Humiliation, Afraid, Rape, and Kick questionnaire    Fear of Current or Ex-Partner: No    Emotionally Abused: No    Physically Abused: No    Sexually Abused: No     PHYSICAL EXAM  GENERAL EXAM/CONSTITUTIONAL: Vitals:  Vitals:   06/28/22 0947  BP: (!)  143/76  Pulse: 65  Weight: 279 lb (126.6 kg)  Height: 5\' 2"  (1.575 m)   Body mass index is 51.03 kg/m. Wt Readings from Last 3 Encounters:  06/28/22 279 lb (126.6 kg)  06/26/22 280 lb 6 oz (127.2 kg)  05/24/22 281 lb 2 oz (127.5 kg)   Patient is in no distress; well developed, nourished and groomed; neck is supple  CARDIOVASCULAR: Examination of carotid arteries is normal; no carotid bruits Regular rate and rhythm, no murmurs Examination of peripheral vascular system by observation and palpation is normal  EYES: Ophthalmoscopic exam of optic discs and posterior segments is normal; no papilledema or hemorrhages No results found.  MUSCULOSKELETAL: Gait, strength, tone, movements noted in Neurologic exam below  NEUROLOGIC: MENTAL STATUS:     03/11/2019    9:50 AM  MMSE - Mini Mental State Exam  Orientation to time 5  Orientation to Place 5  Registration 3  Attention/ Calculation 5  Recall 3  Language- repeat 1   awake, alert, oriented to person, place and time recent and remote memory intact normal attention and concentration language fluent, comprehension intact, naming intact fund of knowledge appropriate  CRANIAL NERVE:  2nd - no papilledema on fundoscopic exam 2nd, 3rd, 4th, 6th - pupils equal and reactive to light, visual fields full to confrontation, extraocular muscles intact, no nystagmus 5th - facial sensation symmetric 7th - facial strength symmetric 8th - hearing intact 9th - palate elevates symmetrically, uvula midline 11th - shoulder shrug symmetric 12th - tongue protrusion midline  MOTOR:  normal bulk and tone, full strength in the BUE, BLE  SENSORY:  normal and symmetric to light touch, temperature, vibration  COORDINATION:  finger-nose-finger, fine finger movements normal  REFLEXES:  deep tendon reflexes TRACE and symmetric  GAIT/STATION:  narrow based gait     DIAGNOSTIC DATA (LABS, IMAGING, TESTING) - I reviewed patient records,  labs, notes, testing and imaging myself where available.  Lab Results  Component Value Date   WBC 7.9 05/08/2022   HGB 13.8 05/08/2022   HCT 41.6 05/08/2022   MCV 86.5 05/08/2022   PLT 271.0 05/08/2022  Component Value Date/Time   NA 139 05/08/2022 0759   K 3.5 05/08/2022 0759   CL 102 05/08/2022 0759   CO2 27 05/08/2022 0759   GLUCOSE 98 05/08/2022 0759   BUN 16 05/08/2022 0759   CREATININE 1.09 05/08/2022 0759   CALCIUM 9.9 05/08/2022 0759   PROT 7.3 05/08/2022 0759   ALBUMIN 4.1 05/08/2022 0759   AST 18 05/08/2022 0759   ALT 11 05/08/2022 0759   ALKPHOS 65 05/08/2022 0759   BILITOT 0.7 05/08/2022 0759   GFRNONAA >60 04/25/2022 0702   GFRAA 81 (L) 05/10/2012 0324   Lab Results  Component Value Date   CHOL 111 05/08/2022   HDL 50.60 05/08/2022   LDLCALC 48 05/08/2022   TRIG 64.0 05/08/2022   CHOLHDL 2 05/08/2022   Lab Results  Component Value Date   HGBA1C 4.8 05/08/2022   No results found for: "VITAMINB12" Lab Results  Component Value Date   TSH 4.93 05/08/2022    04/25/22 MRI brain 1. Positive for Acute Right MCA territory ischemia. Several small cortical and/or subcortical infarcts involving the lateral posterior right temporal lobe cortex, superior perirolandic area. No associated hemorrhage or mass effect. 2. No other acute intracranial abnormality. Underlying moderate for age cerebral white matter signal changes, most commonly due to chronic small vessel disease. 3. Benign appearing roughly 16 mm cyst of the right face nasolabial junction (series 11, image 2).  04/24/22 CTA head / neck 1. Negative CTA for acute large vessel occlusion. 2. Severe near occlusive stenosis at the right ICA terminus/proximal right M1 segment. 3. Atheromatous change within the carotid siphons with associated moderate to severe stenoses about the para clinoid segments, right worse than left. 4. Short-segment moderate proximal basilar stenosis, with additional focal  severe left P2 stenosis. 5. Diffuse tortuosity of the major arterial vasculature of the neck, suggesting chronic underlying hypertension.  04/26/22 TTE 1. Left ventricular ejection fraction, by estimation, is 60 to 65%. The  left ventricle has normal function. The left ventricle has no regional  wall motion abnormalities. There is mild left ventricular hypertrophy.  Left ventricular diastolic parameters  are consistent with Grade I diastolic dysfunction (impaired relaxation).   2. Right ventricular systolic function is normal. The right ventricular  size is normal. There is normal pulmonary artery systolic pressure. The  estimated right ventricular systolic pressure is 28.6 mmHg.   3. The mitral valve is normal in structure. Trivial mitral valve  regurgitation.   4. The aortic valve is tricuspid. Aortic valve regurgitation is not  visualized. No aortic stenosis is present.   5. The inferior vena cava is normal in size with greater than 50%  respiratory variability, suggesting right atrial pressure of 3 mmHg.    ASSESSMENT AND PLAN  72 y.o. year old female here with:   Dx:  1. Cerebrovascular accident (CVA) due to embolism of right middle cerebral artery   2. TIA (transient ischemic attack)     PLAN:  RIGHT MCA STROKE (Feb 2024; also 2nd event likely TIA on 06/22/22) - use aspirin 81 + plavix 75mg  daily x 3 months; then plavix 75mg  daily long term - continue statin, BP control, sleep apnea eval and tx  ACCELERATED INTRACRANIAL ATHEROSCLEROSIS - more than expected, with only mild hypertension over many years; lipids excellent x 10 years; some OSA not treated; no diabetes or smoking - could be genetic factors, such as fibromuscular dysplasia; primary CNS angiitis / vasculitis less likely; will repeat CTA head / neck for follow up study  Meds ordered this encounter  Medications   clopidogrel (PLAVIX) 75 MG tablet    Sig: Take 1 tablet (75 mg total) by mouth daily.    Dispense:   90 tablet    Refill:  4   Return for return to PCP, pending if symptoms worsen or fail to improve.  I reviewed images, labs, notes, records myself. I summarized findings and reviewed with patient, for this high risk condition (stroke; recurrent TIA; intracranial atherosclerosis) requiring high complexity decision making.    Suanne MarkerVIKRAM R. Ahaan Zobrist, MD 06/28/2022, 10:39 AM Certified in Neurology, Neurophysiology and Neuroimaging  [facility] [address], [address] [place], [place] [address] [phone]

## 2022-06-28 NOTE — Patient Instructions (Addendum)
RIGHT MCA STROKE (Feb 2024; 2nd event likely TIA on 06/22/22; ) - use aspirin 81 + plavix 75mg  daily x 3 months; then plavix 75mg  daily long term - continue statin, BP control, sleep apnea eval and tx  ACCELERATED INTRACRANIAL ATHEROSCLEROSIS - more than expected, with only mild hypertension over many years; lipids excellent x 10 years; some OSA not treated; no diabetes or smoking - could be genetic factors, such as fibromuscular dysplasia; primary CNS angiitis / vasculitis less likely; will repeat CTA head / neck for follow up study

## 2022-06-29 ENCOUNTER — Ambulatory Visit: Payer: Medicare HMO | Admitting: Adult Health

## 2022-07-05 ENCOUNTER — Encounter: Payer: Self-pay | Admitting: Family Medicine

## 2022-07-05 ENCOUNTER — Ambulatory Visit (INDEPENDENT_AMBULATORY_CARE_PROVIDER_SITE_OTHER): Payer: Medicare HMO | Admitting: Family Medicine

## 2022-07-05 VITALS — BP 126/68 | HR 81 | Temp 97.6°F | Ht 62.0 in | Wt 277.0 lb

## 2022-07-05 DIAGNOSIS — R599 Enlarged lymph nodes, unspecified: Secondary | ICD-10-CM | POA: Insufficient documentation

## 2022-07-05 DIAGNOSIS — L308 Other specified dermatitis: Secondary | ICD-10-CM | POA: Diagnosis not present

## 2022-07-05 DIAGNOSIS — R21 Rash and other nonspecific skin eruption: Secondary | ICD-10-CM | POA: Diagnosis not present

## 2022-07-05 DIAGNOSIS — L989 Disorder of the skin and subcutaneous tissue, unspecified: Secondary | ICD-10-CM | POA: Diagnosis not present

## 2022-07-05 DIAGNOSIS — L819 Disorder of pigmentation, unspecified: Secondary | ICD-10-CM

## 2022-07-05 LAB — CBC WITH DIFFERENTIAL/PLATELET
Basophils Absolute: 0 10*3/uL (ref 0.0–0.1)
Basophils Relative: 0.5 % (ref 0.0–3.0)
Eosinophils Absolute: 0.2 10*3/uL (ref 0.0–0.7)
Eosinophils Relative: 2.4 % (ref 0.0–5.0)
HCT: 41 % (ref 36.0–46.0)
Hemoglobin: 13.6 g/dL (ref 12.0–15.0)
Lymphocytes Relative: 20.7 % (ref 12.0–46.0)
Lymphs Abs: 1.6 10*3/uL (ref 0.7–4.0)
MCHC: 33 g/dL (ref 30.0–36.0)
MCV: 86.3 fl (ref 78.0–100.0)
Monocytes Absolute: 0.5 10*3/uL (ref 0.1–1.0)
Monocytes Relative: 6.8 % (ref 3.0–12.0)
Neutro Abs: 5.3 10*3/uL (ref 1.4–7.7)
Neutrophils Relative %: 69.6 % (ref 43.0–77.0)
Platelets: 260 10*3/uL (ref 150.0–400.0)
RBC: 4.76 Mil/uL (ref 3.87–5.11)
RDW: 14.7 % (ref 11.5–15.5)
WBC: 7.6 10*3/uL (ref 4.0–10.5)

## 2022-07-05 MED ORDER — TRIAMCINOLONE ACETONIDE 0.1 % EX CREA: 1.0000 | TOPICAL_CREAM | Freq: Two times a day (BID) | CUTANEOUS | 0 refills | Status: DC

## 2022-07-05 NOTE — Assessment & Plan Note (Signed)
Post auricular and lower cervical today  Most prominent behind R ear  Shotty/small and mobile    This may be related to flare of rash (? Eczema) on ears  Will tx with triamcinolone cream  Also derm ref  Cbc ordered F/u 2-3 wk for re check- needs further eval if not improved  Will watch for fever or other symptoms

## 2022-07-05 NOTE — Progress Notes (Signed)
Subjective:    Patient ID: Barbara Kramer, female    DOB: May 18, 1950, 72 y.o.   MRN: [id number]  HPI Pt presents with c/o knots behind ears  Wt Readings from Last 3 Encounters:  07/05/22 277 lb (125.6 kg)  06/28/22 279 lb (126.6 kg)  06/26/22 280 lb 6 oz (127.2 kg)   50.66 kg/m  Vitals:   07/05/22 0956  BP: 126/68  Pulse: 81  Temp: 97.6 F (36.4 C)  SpO2: 99%    One behind R ear Smaller one lower near L ear   No pain  Not tender Not motile  ? If scaly Not itchy at all   Left ear does stay dry and scaly- she uses a bit of vaseline   No uri symptoms     Has had rash on neck in past  Disc poss of eczema /atopic, acanthosis nigrans or necrobiosis  Px triamcinolone  Has had dry spots on face also (lighter skin)   Has appt with derm - but had to cancel and ref to doctor in her network    Patient Active Problem List   Diagnosis Date Noted   Skin hypopigmentation 07/05/2022   Lymph nodes enlarged 07/05/2022   Skin lesions 05/17/2022   Hyperlipidemia 05/03/2022   Intracranial carotid stenosis 04/25/2022   History of TIA (transient ischemic attack) 04/24/2022   Sciatica of left side 01/04/2022   Mobility impaired 08/01/2021   Head pain 06/28/2021   Vulvar cyst 01/25/2021   Rash of neck 08/02/2020   Essential hypertension 06/30/2020   Medicare annual wellness visit, subsequent 05/14/2020   Unilateral primary osteoarthritis, left knee 05/10/2020   Morbid obesity with body mass index (BMI) of 50.0 to 59.9 in adult 05/10/2020   Family history of colon cancer in mother    Benign neoplasm of colon    OSA (obstructive sleep apnea) 01/29/2018   Estrogen deficiency 01/29/2018   Allergic rhinitis 05/15/2017   Screening mammogram, encounter for 10/11/2016   Prediabetes 12/15/2014   Osteoarthritis of left knee 10/16/2013   Colon cancer screening 11/20/2012   Routine gynecological examination 11/17/2011   Other screening mammogram 11/17/2011   Right shoulder  pain 06/05/2011   Low back pain 10/18/2010   Routine general medical examination at a health care facility 10/06/2010   ACANTHOSIS NIGRICANS 01/27/2008   Eczema 08/02/2006   Past Medical History:  Diagnosis Date   Anemia    Arthritis    Breast lump    left   Eczema    Family hx of colon cancer    Gallbladder problem    Hypertension    Kidney cysts    small, right 10/2002   Normal cardiac stress test 11/2008   exercise stress test normal but limited by exercise intolerance   Obesity    Shortness of breath    Sleep apnea    Past Surgical History:  Procedure Laterality Date   CHOLECYSTECTOMY  05/2002   [place]- gallstones, small renal cyst 05/2002   COLONOSCOPY WITH PROPOFOL N/A 07/15/2019   Procedure: COLONOSCOPY WITH PROPOFOL;  Surgeon: Benancio Deeds, MD;  Location: [facility];  Service: Gastroenterology;  Laterality: N/A;   FOOT SURGERY  11/1998   bunion   PARTIAL HYSTERECTOMY  1998   fibroids, ovaries remain   POLYPECTOMY  07/15/2019   Procedure: POLYPECTOMY;  Surgeon: Benancio Deeds, MD;  Location: [facility];  Service: Gastroenterology;;   Social History   Tobacco Use   Smoking status: Former    Packs/day: 0.25  Years: 5.00    Additional pack years: 0.00    Total pack years: 1.25    Types: Cigarettes    Quit date: 03/20/1976    Years since quitting: 46.3   Smokeless tobacco: Never  Substance Use Topics   Alcohol use: No    Alcohol/week: 0.0 standard drinks of alcohol   Drug use: No   Family History  Problem Relation Age of Onset   Colon cancer Mother        38's   Stomach cancer Mother    Cancer Mother    Lung cancer Father        smoker   Cancer Father    Hypertension Sister    Dementia Sister    Hypertension Sister    Stroke Maternal Grandmother    Heart attack Maternal Grandfather    Stroke Paternal Grandmother    Colon polyps Daughter    Allergies  Allergen Reactions   Calcium Carbonate Other (See Comments)    constipation    Penicillins Rash    Has patient had a PCN reaction causing immediate rash, facial/tongue/throat swelling, SOB or lightheadedness with hypotension: no Has patient had a PCN reaction causing severe rash involving mucus membranes or skin necrosis: no Has patient had a PCN reaction that required hospitalization: no Has patient had a PCN reaction occurring within the last 10 years: no If all of the above answers are "NO", then may proceed with Cephalosporin use.    Current Outpatient Medications on File Prior to Visit  Medication Sig Dispense Refill   aspirin EC 81 MG tablet Take 1 tablet (81 mg total) by mouth daily. Swallow whole. 30 tablet 0   atorvastatin (LIPITOR) 20 MG tablet Take 1 tablet (20 mg total) by mouth daily. 90 tablet 3   chlorthalidone (HYGROTON) 25 MG tablet Take 0.5 tablets (12.5 mg total) by mouth daily. 45 tablet 3   clopidogrel (PLAVIX) 75 MG tablet Take 1 tablet (75 mg total) by mouth daily. 90 tablet 4   fluticasone (FLONASE) 50 MCG/ACT nasal spray Place 2 sprays into both nostrils as needed for allergies or rhinitis.     No current facility-administered medications on file prior to visit.    Review of Systems  Constitutional:  Negative for activity change, appetite change, fatigue, fever and unexpected weight change.  HENT:  Negative for congestion, ear pain, rhinorrhea, sinus pressure and sore throat.   Eyes:  Negative for pain, redness and visual disturbance.  Respiratory:  Negative for cough, shortness of breath and wheezing.   Cardiovascular:  Negative for chest pain and palpitations.  Gastrointestinal:  Negative for abdominal pain, blood in stool, constipation and diarrhea.  Endocrine: Negative for polydipsia and polyuria.  Genitourinary:  Negative for dysuria, frequency and urgency.  Musculoskeletal:  Negative for arthralgias, back pain and myalgias.  Skin:  Positive for color change and rash. Negative for pallor and wound.  Allergic/Immunologic: Negative for  environmental allergies.  Neurological:  Negative for dizziness, syncope and headaches.  Hematological:  Negative for adenopathy. Does not bruise/bleed easily.  Psychiatric/Behavioral:  Negative for decreased concentration and dysphoric mood. The patient is not nervous/anxious.        Objective:   Physical Exam Constitutional:      General: She is not in acute distress.    Appearance: Normal appearance. She is obese. She is not ill-appearing or diaphoretic.  HENT:     Head: Normocephalic and atraumatic.     Right Ear: Tympanic membrane normal.  Left Ear: Tympanic membrane normal.     Ears:     Comments: Scale of both external ears (worse on L)  Scale in ear canal  Small amt of scale behind ears  No erythema   Some cerumen/ not blocked however    Mouth/Throat:     Mouth: Mucous membranes are moist.     Pharynx: No posterior oropharyngeal erythema.  Eyes:     General: No scleral icterus.       Right eye: No discharge.        Left eye: No discharge.     Conjunctiva/sclera: Conjunctivae normal.     Pupils: Pupils are equal, round, and reactive to light.  Musculoskeletal:     Cervical back: Normal range of motion and neck supple.  Lymphadenopathy:     Head:     Right side of head: Posterior auricular adenopathy present.     Left side of head: Posterior auricular adenopathy present.     Cervical: Cervical adenopathy present.     Right cervical: Superficial cervical adenopathy present. No posterior cervical adenopathy.    Left cervical: Superficial cervical adenopathy present. No posterior cervical adenopathy.     Comments: Small shotty LN noted  Largest less than 5 mm behind R ear  Mobile Non tender   Skin:    Coloration: Skin is not jaundiced.     Findings: Rash present. No bruising.     Comments: Scale on ears and canals Scale/hyperpig back of neck   Areas of slt scale and hypopigmentation on face /cheeks and also over mcp joints of hands  No plaques    Neurological:     Mental Status: She is alert.     Cranial Nerves: No cranial nerve deficit.  Psychiatric:        Mood and Affect: Mood normal.           Assessment & Plan:   Problem List Items Addressed This Visit       Musculoskeletal and Integument   Eczema    Unsure if this worsens during allergy season Pt has flaking skin on ears and in canals  Poss seb derm as well   Inst to start using triamcinolone cream bid  F/u 2-3 wk for re check  Ref to derm also (needs one in network) for this and hypopigmentation on face and hands       Rash of neck    Discussed differential dx incl eczema and acanthosis nigricans / less likely necrobiotis   Enc to try the triamcinolone cream (she has not used but has at home)   Avoid fragrances   Some scale on ears -? If related        Relevant Orders   Ambulatory referral to Dermatology   Skin lesions    Areas of hypopigmentation on face/now on mcp joints of hands  ? If related to eczema Cannot r/o vitiligo Trying to find dermatologist in network         Immune and Lymphatic   Lymph nodes enlarged - Primary    Post auricular and lower cervical today  Most prominent behind R ear  Shotty/small and mobile    This may be related to flare of rash (? Eczema) on ears  Will tx with triamcinolone cream  Also derm ref  Cbc ordered F/u 2-3 wk for re check- needs further eval if not improved  Will watch for fever or other symptoms       Relevant Orders   CBC  with Differential/Platelet     Other   Skin hypopigmentation    Face MCP joint areas of hands  ? If related to eczema/atopy Cannot r/o vitiligo  Derm ref done (looking for someone in network)       Relevant Orders   Ambulatory referral to Dermatology

## 2022-07-05 NOTE — Assessment & Plan Note (Signed)
Face MCP joint areas of hands  ? If related to eczema/atopy Cannot r/o vitiligo  Derm ref done (looking for someone in network)

## 2022-07-05 NOTE — Assessment & Plan Note (Signed)
Areas of hypopigmentation on face/now on mcp joints of hands  ? If related to eczema Cannot r/o vitiligo Trying to find dermatologist in network

## 2022-07-05 NOTE — Assessment & Plan Note (Signed)
Unsure if this worsens during allergy season Pt has flaking skin on ears and in canals  Poss seb derm as well   Inst to start using triamcinolone cream bid  F/u 2-3 wk for re check  Ref to derm also (needs one in network) for this and hypopigmentation on face and hands

## 2022-07-05 NOTE — Patient Instructions (Addendum)
Use the triamcinolone cream on your neck/ ears and a little in the ear canal with q tip 1-2 times daily   Use moisturizer on all dry areas  I will place a referral to dermatology to find someone in your network   The lymph nodes I feel in neck and behind ears may be from the skin inflammation  Let's check labs today   Follow up in 2-3 weeks- to re check lymph nodes and skin   Use scent free products  Use dove soap for sensitive skin

## 2022-07-05 NOTE — Assessment & Plan Note (Signed)
Discussed differential dx incl eczema and acanthosis nigricans / less likely necrobiotis   Enc to try the triamcinolone cream (she has not used but has at home)   Avoid fragrances   Some scale on ears -? If related

## 2022-07-06 ENCOUNTER — Telehealth: Payer: Self-pay | Admitting: *Deleted

## 2022-07-06 DIAGNOSIS — Z1231 Encounter for screening mammogram for malignant neoplasm of breast: Secondary | ICD-10-CM

## 2022-07-06 NOTE — Telephone Encounter (Signed)
Barbara Kramer, [name] K2 weeks ago   Authorization has just been approved I have sent a message to the sleep center to have this scheduled. I will call the pt to update her.

## 2022-07-06 NOTE — Telephone Encounter (Signed)
Pt agrees to proceed with mammogram. She goes to [place]. Please put order in and I will send it to them. Pt advised to call them directly to schedule appt. She will call next week to give [place] time to get order sent to them.

## 2022-07-06 NOTE — Telephone Encounter (Signed)
I put the order in external  Please release and fax  Thanks

## 2022-07-06 NOTE — Telephone Encounter (Signed)
Form printed and faxed to [facility]

## 2022-07-06 NOTE — Telephone Encounter (Signed)
-----   Message from Judy Pimple, MD sent at 07/05/2022  1:44 PM EDT ----- I just realized she is due for a mammogram, she originally wanted to wait until done with plavix but now looks like she will stay on plavix , would you please call her and see if she needs referral for mammogram?   Thanks

## 2022-07-10 ENCOUNTER — Ambulatory Visit (HOSPITAL_BASED_OUTPATIENT_CLINIC_OR_DEPARTMENT_OTHER): Payer: Medicare HMO | Attending: Adult Health | Admitting: Internal Medicine

## 2022-07-10 ENCOUNTER — Encounter (HOSPITAL_BASED_OUTPATIENT_CLINIC_OR_DEPARTMENT_OTHER): Payer: Medicare HMO | Admitting: Internal Medicine

## 2022-07-10 VITALS — Ht 62.0 in | Wt 277.0 lb

## 2022-07-10 DIAGNOSIS — G4733 Obstructive sleep apnea (adult) (pediatric): Secondary | ICD-10-CM | POA: Diagnosis not present

## 2022-07-10 DIAGNOSIS — G459 Transient cerebral ischemic attack, unspecified: Secondary | ICD-10-CM | POA: Diagnosis not present

## 2022-07-11 DIAGNOSIS — Z1231 Encounter for screening mammogram for malignant neoplasm of breast: Secondary | ICD-10-CM | POA: Diagnosis not present

## 2022-07-11 LAB — HM MAMMOGRAPHY

## 2022-07-13 ENCOUNTER — Ambulatory Visit (INDEPENDENT_AMBULATORY_CARE_PROVIDER_SITE_OTHER): Payer: Medicare HMO | Admitting: Family Medicine

## 2022-07-13 ENCOUNTER — Encounter: Payer: Self-pay | Admitting: Family Medicine

## 2022-07-13 VITALS — BP 126/72 | HR 65 | Temp 98.0°F | Ht 62.0 in | Wt 272.2 lb

## 2022-07-13 DIAGNOSIS — D126 Benign neoplasm of colon, unspecified: Secondary | ICD-10-CM

## 2022-07-13 DIAGNOSIS — K5901 Slow transit constipation: Secondary | ICD-10-CM | POA: Diagnosis not present

## 2022-07-13 DIAGNOSIS — Z800 Family history of malignant neoplasm of digestive organs: Secondary | ICD-10-CM

## 2022-07-13 DIAGNOSIS — R599 Enlarged lymph nodes, unspecified: Secondary | ICD-10-CM | POA: Diagnosis not present

## 2022-07-13 DIAGNOSIS — K5900 Constipation, unspecified: Secondary | ICD-10-CM | POA: Insufficient documentation

## 2022-07-13 NOTE — Assessment & Plan Note (Addendum)
Pt was due for 2 y recall colonoscopy 06/2021 but she could not afford the copay to go  Noted this today  Reviewed last colonoscopy and path reports from Dr Adela Lank  Had hyperplastic and serrated polyps Mother had colon cancer in her 88s  Now pt is on asa and plavix (later will be plavix alone) for TIA Unsure what options are  Also has some new constipation  Ref done to her GI provider to visit and discuss

## 2022-07-13 NOTE — Assessment & Plan Note (Signed)
This is worse than baseline May be due to a period of time w/o as much fruit and veg  Fruit and juices help (but juice is high in sugar) Reassuring exam Due for colonoscopy- see a/p for fam hx  Will set up consult with GI to discuss  Adv trial of miralax daily or more often prn and handout given  Prunes prn in moderation  [employer] fluids to 64 oz daily if able  Update if not starting to improve in a week or if worsening

## 2022-07-13 NOTE — Assessment & Plan Note (Signed)
R post auricular node seems smaller today  Cbc was nl F/u planned to re check

## 2022-07-13 NOTE — Patient Instructions (Addendum)
Aim for 64 oz of fluids daily (mostly water)   Get miralax over the counter (bury the store brand to save money) Mix with fluid and take daily  Get an idea of how much and often you need it  Give it 2-3 days to work   Keep up fluids Eat fruits and veggies also  Also exercise helps constipation   Let's schedule a follow up with GI to discuss colon cancer screening   I put the referral in  Please let us know if you don't hear in 1-2 weeks

## 2022-07-13 NOTE — Assessment & Plan Note (Signed)
Overdue for colonoscopy but now on asa and plavix Ref to GI to discuss

## 2022-07-13 NOTE — Progress Notes (Signed)
Subjective:    Patient ID: Barbara Kramer, female    DOB: [birth year], 72 y.o.   MRN: [id number]  HPI Pt presents with c/o constipation  Wt Readings from Last 3 Encounters:  07/13/22 272 lb 4 oz (123.5 kg)  07/10/22 277 lb (125.6 kg)  07/05/22 277 lb (125.6 kg)   49.80 kg/m  Continues with intentional weight loss   Vitals:   07/13/22 1202  BP: 126/72  Pulse: 65  Temp: 98 F (36.7 C)  SpO2: 100%   Constipation  May have started when she ate less fruit for a while May not drink enough fluids  Fruit and apple juice do not help    Tried suppository  Tried metamucil   This am she drank apple juice  Then abdomen got gurgling  Some bloating  Really has to strain  No blood in stool   Did have bm today- it was softer   Has not tried miralax or stool softener      Colonoscopy was 06/2019 with ? 2 year recall She did not do that , did not have the $ to pay the copay for that  Had polyps hyperplasitc, serrated   Family h/o colon cancer in her mother (was in her 44s)  Currently on plavix for TIAs  Jonne Ply also  Has carotid stenosis   Watching LN behind her R ear ? Related to ear eczema  Was ref to derm for this and ? vitiligo Lab Results  Component Value Date   WBC 7.6 07/05/2022   HGB 13.6 07/05/2022   HCT 41.0 07/05/2022   MCV 86.3 07/05/2022   PLT 260.0 07/05/2022   Patient Active Problem List   Diagnosis Date Noted   Constipation 07/13/2022   Skin hypopigmentation 07/05/2022   Lymph nodes enlarged 07/05/2022   Skin lesions 05/17/2022   Hyperlipidemia 05/03/2022   Intracranial carotid stenosis 04/25/2022   History of TIA (transient ischemic attack) 04/24/2022   Sciatica of left side 01/04/2022   Mobility impaired 08/01/2021   Head pain 06/28/2021   Vulvar cyst 01/25/2021   Rash of neck 08/02/2020   Essential hypertension 06/30/2020   Medicare annual wellness visit, subsequent 05/14/2020   Unilateral primary osteoarthritis, left knee  05/10/2020   Morbid obesity with body mass index (BMI) of 50.0 to 59.9 in adult 05/10/2020   Family history of colon cancer in mother    Benign neoplasm of colon    OSA (obstructive sleep apnea) 01/29/2018   Estrogen deficiency 01/29/2018   Allergic rhinitis 05/15/2017   Screening mammogram, encounter for 10/11/2016   Prediabetes 12/15/2014   Osteoarthritis of left knee 10/16/2013   Colon cancer screening 11/20/2012   Routine gynecological examination 11/17/2011   Other screening mammogram 11/17/2011   Right shoulder pain 06/05/2011   Low back pain 10/18/2010   Routine general medical examination at a health care facility 10/06/2010   ACANTHOSIS NIGRICANS 01/27/2008   Eczema 08/02/2006   Past Medical History:  Diagnosis Date   Anemia    Arthritis    Breast lump    left   Eczema    Family hx of colon cancer    Gallbladder problem    Hypertension    Kidney cysts    small, right 10/2002   Normal cardiac stress test 11/2008   exercise stress test normal but limited by exercise intolerance   Obesity    Shortness of breath    Sleep apnea    Past Surgical History:  Procedure Laterality Date  CHOLECYSTECTOMY  05/2002   [place]- gallstones, small renal cyst 05/2002   COLONOSCOPY WITH PROPOFOL N/A 07/15/2019   Procedure: COLONOSCOPY WITH PROPOFOL;  Surgeon: Benancio Deeds, MD;  Location: [facility];  Service: Gastroenterology;  Laterality: N/A;   FOOT SURGERY  11/1998   bunion   PARTIAL HYSTERECTOMY  1998   fibroids, ovaries remain   POLYPECTOMY  07/15/2019   Procedure: POLYPECTOMY;  Surgeon: Benancio Deeds, MD;  Location: [facility];  Service: Gastroenterology;;   Social History   Tobacco Use   Smoking status: Former    Packs/day: 0.25    Years: 5.00    Additional pack years: 0.00    Total pack years: 1.25    Types: Cigarettes    Quit date: 03/20/1976    Years since quitting: 46.3   Smokeless tobacco: Never  Substance Use Topics   Alcohol use: No     Alcohol/week: 0.0 standard drinks of alcohol   Drug use: No   Family History  Problem Relation Age of Onset   Colon cancer Mother        20's   Stomach cancer Mother    Cancer Mother    Lung cancer Father        smoker   Cancer Father    Hypertension Sister    Dementia Sister    Hypertension Sister    Stroke Maternal Grandmother    Heart attack Maternal Grandfather    Stroke Paternal Grandmother    Colon polyps Daughter    Allergies  Allergen Reactions   Calcium Carbonate Other (See Comments)    constipation   Penicillins Rash    Has patient had a PCN reaction causing immediate rash, facial/tongue/throat swelling, SOB or lightheadedness with hypotension: no Has patient had a PCN reaction causing severe rash involving mucus membranes or skin necrosis: no Has patient had a PCN reaction that required hospitalization: no Has patient had a PCN reaction occurring within the last 10 years: no If all of the above answers are "NO", then may proceed with Cephalosporin use.    Current Outpatient Medications on File Prior to Visit  Medication Sig Dispense Refill   aspirin EC 81 MG tablet Take 1 tablet (81 mg total) by mouth daily. Swallow whole. 30 tablet 0   atorvastatin (LIPITOR) 20 MG tablet Take 1 tablet (20 mg total) by mouth daily. 90 tablet 3   chlorthalidone (HYGROTON) 25 MG tablet Take 0.5 tablets (12.5 mg total) by mouth daily. 45 tablet 3   clopidogrel (PLAVIX) 75 MG tablet Take 1 tablet (75 mg total) by mouth daily. 90 tablet 4   fluticasone (FLONASE) 50 MCG/ACT nasal spray Place 2 sprays into both nostrils as needed for allergies or rhinitis.     triamcinolone cream (KENALOG) 0.1 % Apply 1 Application topically 2 (two) times daily. Small amount to affected area 1 g 0   No current facility-administered medications on file prior to visit.    Review of Systems  Constitutional:  Negative for activity change, appetite change, fatigue, fever and unexpected weight change.   HENT:  Negative for congestion, ear pain, rhinorrhea, sinus pressure and sore throat.   Eyes:  Negative for pain, redness and visual disturbance.  Respiratory:  Negative for cough, shortness of breath and wheezing.   Cardiovascular:  Negative for chest pain and palpitations.  Gastrointestinal:  Positive for constipation. Negative for abdominal distention, abdominal pain, anal bleeding, blood in stool, diarrhea, nausea, rectal pain and vomiting.       Some  bloating that improves after BM  Endocrine: Negative for polydipsia and polyuria.  Genitourinary:  Negative for dysuria, frequency and urgency.  Musculoskeletal:  Negative for arthralgias, back pain and myalgias.  Skin:  Negative for pallor and rash.  Allergic/Immunologic: Negative for environmental allergies.  Neurological:  Negative for dizziness, syncope and headaches.  Hematological:  Negative for adenopathy. Does not bruise/bleed easily.  Psychiatric/Behavioral:  Negative for decreased concentration and dysphoric mood. The patient is not nervous/anxious.           Objective:   Physical Exam Constitutional:      General: She is not in acute distress.    Appearance: Normal appearance. She is well-developed. She is obese. She is not ill-appearing or diaphoretic.  HENT:     Head: Normocephalic and atraumatic.     Mouth/Throat:     Mouth: Mucous membranes are moist.  Eyes:     General: No scleral icterus.    Conjunctiva/sclera: Conjunctivae normal.     Pupils: Pupils are equal, round, and reactive to light.  Neck:     Thyroid: No thyromegaly.     Vascular: No carotid bruit or JVD.     Comments: Posterior auricular LN on R is smaller Non tender Cardiovascular:     Rate and Rhythm: Normal rate and regular rhythm.     Heart sounds: Normal heart sounds.     No gallop.  Pulmonary:     Effort: Pulmonary effort is normal. No respiratory distress.     Breath sounds: Normal breath sounds. No wheezing or rales.  Abdominal:      General: Abdomen is protuberant. There is no distension or abdominal bruit.     Palpations: Abdomen is soft. There is no hepatomegaly, splenomegaly, mass or pulsatile mass.     Tenderness: There is no abdominal tenderness. There is no right CVA tenderness, left CVA tenderness, guarding or rebound.     Hernia: No hernia is present.  Musculoskeletal:     Cervical back: Normal range of motion and neck supple.     Right lower leg: No edema.     Left lower leg: No edema.  Lymphadenopathy:     Cervical: No cervical adenopathy.  Skin:    General: Skin is warm and dry.     Coloration: Skin is not jaundiced or pale.     Findings: No bruising or rash.  Neurological:     Mental Status: She is alert.     Coordination: Coordination normal.     Deep Tendon Reflexes: Reflexes are normal and symmetric. Reflexes normal.  Psychiatric:        Mood and Affect: Mood normal.           Assessment & Plan:   Problem List Items Addressed This Visit       Digestive   Benign neoplasm of colon    Overdue for colonoscopy but now on asa and plavix Ref to GI to discuss      Relevant Orders   Ambulatory referral to Gastroenterology     Immune and Lymphatic   Lymph nodes enlarged    R post auricular node seems smaller today  Cbc was nl F/u planned to re check         Other   Constipation - Primary    This is worse than baseline May be due to a period of time w/o as much fruit and veg  Fruit and juices help (but juice is high in sugar) Reassuring exam Due for colonoscopy- see  a/p for fam hx  Will set up consult with GI to discuss  Adv trial of miralax daily or more often prn and handout given  Prunes prn in moderation  [employer] fluids to 64 oz daily if able  Update if not starting to improve in a week or if worsening         Relevant Orders   Ambulatory referral to Gastroenterology   Family history of colon cancer in mother    Pt was due for 2 y recall colonoscopy 06/2021 but she  could not afford the copay to go  Noted this today  Reviewed last colonoscopy and path reports from Dr Adela Lank  Had hyperplastic and serrated polyps Mother had colon cancer in her 74s  Now pt is on asa and plavix (later will be plavix alone) for TIA Unsure what options are  Also has some new constipation  Ref done to her GI provider to visit and discuss       Relevant Orders   Ambulatory referral to Gastroenterology

## 2022-07-15 DIAGNOSIS — G4733 Obstructive sleep apnea (adult) (pediatric): Secondary | ICD-10-CM | POA: Diagnosis not present

## 2022-07-15 NOTE — Procedures (Signed)
Patient Name: Barbara Kramer, Barbara Kramer Date: 07/10/2022 Gender: Female D.O.B: [birth year] Age (years): 62 Referring Provider: Babette Relic Parrett Height (inches): 62 Interpreting Physician: Jetty Duhamel MD, ABSM Weight (lbs): 277 RPSGT: Ulyess Mort BMI: 51 MRN: [id number] Neck Size: 15.50  CLINICAL INFORMATION Sleep Study Type: Split Night CPAP Indication for sleep study: Excessive Daytime Sleepiness, Fatigue, Hypertension, Obesity, OSA, Snoring Epworth Sleepiness Score: 12  SLEEP STUDY TECHNIQUE As per the AASM Manual for the Scoring of Sleep and Associated Events v2.3 (April 2016) with a hypopnea requiring 4% desaturations.  The channels recorded and monitored were frontal, central and occipital EEG, electrooculogram (EOG), submentalis EMG (chin), nasal and oral airflow, thoracic and abdominal wall motion, anterior tibialis EMG, snore microphone, electrocardiogram, and pulse oximetry. Continuous positive airway pressure (CPAP) was initiated when the patient met split night criteria and was titrated according to treat sleep-disordered breathing.  MEDICATIONS Medications self-administered by patient taken the night of the study : none reported  RESPIRATORY PARAMETERS Diagnostic  Total AHI (/hr): 16.4 RDI (/hr): 20.7 OA Index (/hr): 1 CA Index (/hr): 0.0 REM AHI (/hr): 53.1 NREM AHI (/hr): 2.0 Supine AHI (/hr): 0.0 Non-supine AHI (/hr): 18.4 Min O2 Sat (%): 88.0 Mean O2 (%): 94.9 Time below 88% (min): 0.1   Titration  Optimal Pressure (cm): 13 AHI at Optimal Pressure (/hr): 0 Min O2 at Optimal Pressure (%): 94.0 Supine % at Optimal (%): 100 Sleep % at Optimal (%): 99   SLEEP ARCHITECTURE The recording time for the entire night was 397.3 minutes.  During a baseline period of 135.4 minutes, the patient slept for 124.4 minutes in REM and nonREM, yielding a sleep efficiency of 91.9%. Sleep onset after lights out was 8.4 minutes with a REM latency of 72.5 minutes. The patient  spent 1.6% of the night in stage N1 sleep, 70.3% in stage N2 sleep, 0.0% in stage N3 and 28.1% in REM.  During the titration period of 0.0 minutes, the patient slept for 0.0 minutes in REM and nonREM, yielding a sleep efficiency of N/A%. Sleep onset after CPAP initiation was N/A minutes with a REM latency of N/A minutes. The patient spent N/A% of the night in stage N1 sleep, N/A% in stage N2 sleep, N/A% in stage N3 and 0% in REM.  CARDIAC DATA The 2 lead EKG demonstrated sinus rhythm. The mean heart rate was 66.2 beats per minute. Other EKG findings include: PVCs.  LEG MOVEMENT DATA The total Periodic Limb Movements of Sleep (PLMS) were 0. The PLMS index was 0.0 .  IMPRESSIONS - Moderate obstructive sleep apnea occurred during the diagnostic portion of the study (AHI = 16.4/hour). An optimal PAP pressure was selected for this patient ( 13 cm of water) - The patient snored with moderate snoring volume during the diagnostic portion of the study. Minimum O2 saturation on CPAP13 was 94%. - EKG findings include PVCs. - Clinically significant periodic limb movements did not occur during sleep.  DIAGNOSIS - Obstructive Sleep Apnea (G47.33)  RECOMMENDATIONS - Trial of CPAP therapy on 13 cm H2O or autopap 8-18. - Patient wore a Medium size Fisher[employer]Paykel Full Face Simplus mask and heated humidification. - Be careful with alcohol, sedatives and other CNS depressants that may worsen sleep apnea and disrupt normal sleep architecture. - Sleep hygiene should be reviewed to assess factors that may improve sleep quality. - Weight management and regular exercise should be initiated or continued.  [Electronically signed] 07/15/2022 01:12 PM  Jetty Duhamel MD, ABSM Diplomate, American Board of Sleep Medicine  NPI: [id number]                         Jetty Duhamel Diplomate, American Board of Sleep Medicine  ELECTRONICALLY SIGNED ON:  07/15/2022, 1:09 PM [facility] PH: [phone]   FX: [id number] ACCREDITED BY THE AMERICAN ACADEMY OF SLEEP MEDICINE

## 2022-07-26 ENCOUNTER — Ambulatory Visit: Payer: Medicare HMO | Admitting: Family Medicine

## 2022-07-28 ENCOUNTER — Other Ambulatory Visit: Payer: Medicare HMO

## 2022-07-28 ENCOUNTER — Ambulatory Visit (INDEPENDENT_AMBULATORY_CARE_PROVIDER_SITE_OTHER)
Admission: RE | Admit: 2022-07-28 | Discharge: 2022-07-28 | Disposition: A | Discharge status: Home / Self Care | Payer: Medicare HMO | Source: Ambulatory Visit | Attending: Family Medicine | Admitting: Family Medicine

## 2022-07-28 ENCOUNTER — Encounter: Payer: Self-pay | Admitting: Family Medicine

## 2022-07-28 ENCOUNTER — Ambulatory Visit (INDEPENDENT_AMBULATORY_CARE_PROVIDER_SITE_OTHER): Payer: Medicare HMO | Admitting: Family Medicine

## 2022-07-28 VITALS — BP 134/70 | HR 93 | Temp 99.3°F | Ht 62.0 in | Wt 275.1 lb

## 2022-07-28 DIAGNOSIS — J209 Acute bronchitis, unspecified: Secondary | ICD-10-CM

## 2022-07-28 DIAGNOSIS — J189 Pneumonia, unspecified organism: Secondary | ICD-10-CM | POA: Diagnosis not present

## 2022-07-28 DIAGNOSIS — J90 Pleural effusion, not elsewhere classified: Secondary | ICD-10-CM | POA: Diagnosis not present

## 2022-07-28 DIAGNOSIS — R059 Cough, unspecified: Secondary | ICD-10-CM | POA: Diagnosis not present

## 2022-07-28 DIAGNOSIS — R051 Acute cough: Secondary | ICD-10-CM

## 2022-07-28 LAB — POC COVID19 BINAXNOW: SARS Coronavirus 2 Ag: NEGATIVE

## 2022-07-28 MED ORDER — PREDNISONE 20 MG PO TABS
20.0000 mg | ORAL_TABLET | Freq: Every day | ORAL | 0 refills | Status: DC
Start: 2022-07-28 — End: 2022-08-03

## 2022-07-28 MED ORDER — DOXYCYCLINE HYCLATE 100 MG PO TABS: 100.0000 mg | ORAL_TABLET | Freq: Two times a day (BID) | ORAL | 0 refills | Status: DC

## 2022-07-28 MED ORDER — PROMETHAZINE-DM 6.25-15 MG/5ML PO SYRP: 5.0000 mL | ORAL_SOLUTION | Freq: Three times a day (TID) | ORAL | 0 refills | Status: DC | PRN

## 2022-07-28 NOTE — Assessment & Plan Note (Signed)
Cough day 2 -some clear phlegm and tight breathing but not wheezing  Bs are more harsh in R base Cxr ordered - noted focal opacity in R anterior R lower lobe and trace pleural effusion  She is pcn allergic Neg covid test today  px Prednisone  Prometh dm  Doxycycline bid   Plan a follow up next wk Discussed symptoms care-see AVS ER precautions reviewed  Will need repeat cxr in about 6 weeks   Meds ordered this encounter  Medications   predniSONE (DELTASONE) 20 MG tablet    Sig: Take 1 tablet (20 mg total) by mouth daily with breakfast.    Dispense:  5 tablet    Refill:  0   promethazine-dextromethorphan (PROMETHAZINE-DM) 6.25-15 MG/5ML syrup    Sig: Take 5 mLs by mouth 3 (three) times daily as needed for cough.    Dispense:  118 mL    Refill:  0    Cautoin of sedation   doxycycline (VIBRA-TABS) 100 MG tablet    Sig: Take 1 tablet (100 mg total) by mouth 2 (two) times daily.    Dispense:  14 tablet    Refill:  0

## 2022-07-28 NOTE — Patient Instructions (Signed)
Drink lots of fluids You can take some mucinex over the counter if you have a lot of phlegm to cough up   Watch for fever  If trouble breathing - go to the ER  Chest xray today  We will likely send some low dose prednisone and cough medicine but I want to see the chest xray report first

## 2022-07-28 NOTE — Progress Notes (Unsigned)
Subjective:    Patient ID: Barbara Kramer, female    DOB: 10-31-1950, 72 y.o.   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 There is a fluid level overlying the right posterior costophrenic sulcus suggesting trace effusion. There is a right lower lobe opacity anteriorly, notable on the lateral view, new from prior exam.No acute osseous abnormality. Multilevel degenerative changes of the spine. Right upper quadrant surgical clips. IMPRESSION: Trace right pleural effusion and focal opacity in the anterior right lower lobe concerning for pneumonia. Recommend follow-up PA and lateral chest radiograph in 6 weeks after treatment. Electronically Signed   By: Caprice Renshaw M.D.   On: 07/28/2022 16:20   SLEEP STUDY DOCUMENTS  Result Date: 07/12/2022 Ordered by an unspecified provider.    Patient Active Problem List   Diagnosis Date Noted   Acute bronchitis 07/28/2022   Constipation 07/13/2022   Skin hypopigmentation 07/05/2022   Lymph nodes enlarged 07/05/2022   Skin lesions 05/17/2022   Hyperlipidemia 05/03/2022   Intracranial  carotid stenosis 04/25/2022   History of TIA (transient ischemic attack) 04/24/2022   Sciatica of left side 01/04/2022   Mobility impaired 08/01/2021   Head pain 06/28/2021   Vulvar cyst 01/25/2021   Rash of neck 08/02/2020   Essential hypertension 06/30/2020   Medicare annual wellness visit, subsequent 05/14/2020   Unilateral primary osteoarthritis, left knee 05/10/2020   Morbid obesity with body mass index (BMI) of 50.0 to 59.9 in adult [facility]) 05/10/2020   Family history of colon cancer in mother    Benign neoplasm of colon    OSA (obstructive sleep apnea) 01/29/2018   Estrogen deficiency 01/29/2018   Allergic rhinitis 05/15/2017   Screening mammogram, encounter for 10/11/2016   Prediabetes 12/15/2014   Osteoarthritis of left knee 10/16/2013   Colon cancer screening 11/20/2012   Routine gynecological examination 11/17/2011   Other screening mammogram 11/17/2011   Right shoulder pain 06/05/2011   Low back pain 10/18/2010   Routine general medical examination at a health care facility 10/06/2010   ACANTHOSIS NIGRICANS 01/27/2008   Eczema 08/02/2006   Past Medical History:  Diagnosis Date   Anemia    Arthritis    Breast lump    left   Eczema    Family hx of colon cancer    Gallbladder problem    Hypertension    Kidney cysts    small, right 10/2002  Normal cardiac stress test 11/2008   exercise stress test normal but limited by exercise intolerance   Obesity    Shortness of breath    Sleep apnea    Past Surgical History:  Procedure Laterality Date   CHOLECYSTECTOMY  05/2002   [place]- gallstones, small renal cyst 05/2002   COLONOSCOPY WITH PROPOFOL N/A 07/15/2019   Procedure: COLONOSCOPY WITH PROPOFOL;  Surgeon: Benancio Deeds, MD;  Location: [facility];  Service: Gastroenterology;  Laterality: N/A;   FOOT SURGERY  11/1998   bunion   PARTIAL HYSTERECTOMY  1998   fibroids, ovaries remain   POLYPECTOMY  07/15/2019   Procedure: POLYPECTOMY;  Surgeon: Benancio Deeds, MD;  Location: [facility];  Service: Gastroenterology;;   Social History   Tobacco Use   Smoking status: Former    Packs/day: 0.25    Years: 5.00    Additional pack years: 0.00    Total pack years: 1.25    Types: Cigarettes    Quit date: 03/20/1976    Years since quitting: 46.3   Smokeless tobacco: Never  Substance Use Topics   Alcohol use: No    Alcohol/week: 0.0 standard drinks of alcohol   Drug use: No   Family History  Problem Relation Age of Onset   Colon cancer Mother        57's   Stomach cancer Mother    Cancer Mother    Lung cancer Father        smoker   Cancer Father    Hypertension Sister    Dementia Sister    Hypertension Sister    Stroke Maternal Grandmother    Heart attack Maternal Grandfather    Stroke Paternal Grandmother    Colon polyps Daughter    Allergies  Allergen Reactions   Calcium Carbonate Other (See Comments)    constipation   Penicillins Rash    Has patient had a PCN reaction causing immediate rash, facial/tongue/throat swelling, SOB or lightheadedness with hypotension: no Has patient had a PCN reaction causing severe rash involving mucus membranes or skin necrosis: no Has patient had a PCN reaction that required hospitalization: no Has patient had a PCN reaction occurring within the last 10 years: no If all of the above answers are "NO", then may proceed with Cephalosporin use.    Current Outpatient Medications on File Prior to Visit  Medication Sig Dispense Refill   aspirin EC 81 MG tablet Take 1 tablet (81 mg total) by mouth daily. Swallow whole. 30 tablet 0   atorvastatin (LIPITOR) 20 MG tablet Take 1 tablet (20 mg total) by mouth daily. 90 tablet 3   chlorthalidone (HYGROTON) 25 MG tablet Take 0.5 tablets (12.5 mg total) by mouth daily. 45 tablet 3   clopidogrel (PLAVIX) 75 MG tablet Take 1 tablet (75 mg total) by mouth daily. 90 tablet 4   fluticasone (FLONASE) 50 MCG/ACT nasal spray Place 2 sprays into both nostrils as  needed for allergies or rhinitis.     triamcinolone cream (KENALOG) 0.1 % Apply 1 Application topically 2 (two) times daily. Small amount to affected area 1 g 0   No current facility-administered medications on file prior to visit.    Review of Systems  Constitutional:  Positive for appetite change and fatigue. Negative for fever.  HENT:  Positive for congestion, postnasal drip and rhinorrhea. Negative for ear pain, sinus pressure, sneezing and sore throat.   Eyes:  Negative for pain and discharge.  Respiratory:  Positive for cough and chest  tightness. Negative for shortness of breath, wheezing and stridor.   Cardiovascular:  Negative for chest pain.  Gastrointestinal:  Negative for diarrhea, nausea and vomiting.  Genitourinary:  Negative for frequency, hematuria and urgency.  Musculoskeletal:  Negative for arthralgias and myalgias.  Skin:  Negative for rash.  Neurological:  Negative for dizziness, weakness, light-headedness and headaches.  Psychiatric/Behavioral:  Negative for confusion and dysphoric mood.        Objective:   Physical Exam Constitutional:      General: She is not in acute distress.    Appearance: Normal appearance. She is obese. She is not ill-appearing or diaphoretic.  HENT:     Head: Normocephalic and atraumatic.     Mouth/Throat:     Mouth: Mucous membranes are moist.     Pharynx: Oropharynx is clear. No oropharyngeal exudate or posterior oropharyngeal erythema.     Comments: Mild clear pnd Eyes:     General:        Right eye: No discharge.        Left eye: No discharge.     Conjunctiva/sclera: Conjunctivae normal.     Pupils: Pupils are equal, round, and reactive to light.  Neck:     Comments: LN R near ear is smaller  Cardiovascular:     Rate and Rhythm: Regular rhythm. Tachycardia present.     Heart sounds: Normal heart sounds.  Pulmonary:     Effort: Pulmonary effort is normal. No respiratory distress.     Breath sounds: No stridor. No wheezing,  rhonchi or rales.     Comments: Bs are more harsh sounding in R base    Musculoskeletal:     Cervical back: No tenderness.  Skin:    General: Skin is warm and dry.     Findings: No erythema.  Neurological:     Mental Status: She is alert.  Psychiatric:        Mood and Affect: Mood normal.           Assessment & Plan:   Problem List Items Addressed This Visit       Respiratory   CAP (community acquired pneumonia) - Primary    Cough day 2 -some clear phlegm and tight breathing but not wheezing  Bs are more harsh in R base Cxr ordered - noted focal opacity in R anterior R lower lobe and trace pleural effusion  She is pcn allergic Neg covid test today  px Prednisone  Prometh dm  Doxycycline bid   Plan a follow up next wk Discussed symptoms care-see AVS ER precautions reviewed  Will need repeat cxr in about 6 weeks   Meds ordered this encounter  Medications   predniSONE (DELTASONE) 20 MG tablet    Sig: Take 1 tablet (20 mg total) by mouth daily with breakfast.    Dispense:  5 tablet    Refill:  0   promethazine-dextromethorphan (PROMETHAZINE-DM) 6.25-15 MG/5ML syrup    Sig: Take 5 mLs by mouth 3 (three) times daily as needed for cough.    Dispense:  118 mL    Refill:  0    Cautoin of sedation   doxycycline (VIBRA-TABS) 100 MG tablet    Sig: Take 1 tablet (100 mg total) by mouth 2 (two) times daily.    Dispense:  14 tablet    Refill:  0         Relevant Medications   promethazine-dextromethorphan (PROMETHAZINE-DM) 6.25-15 MG/5ML syrup   doxycycline (VIBRA-TABS) 100 MG tablet   Other  Visit Diagnoses     Acute cough       Relevant Orders   POC COVID-19 (Completed)   DG Chest 2 View (Completed)

## 2022-07-31 ENCOUNTER — Telehealth: Payer: Self-pay | Admitting: *Deleted

## 2022-07-31 NOTE — Telephone Encounter (Signed)
Per Dr. Milinda Antis pt needs a f/u this week due to xray results, please schedule

## 2022-07-31 NOTE — Telephone Encounter (Signed)
Scheduled ov for 08/01/22

## 2022-08-01 ENCOUNTER — Telehealth (INDEPENDENT_AMBULATORY_CARE_PROVIDER_SITE_OTHER): Payer: Medicare HMO | Admitting: Adult Health

## 2022-08-01 ENCOUNTER — Ambulatory Visit: Payer: Medicare HMO | Admitting: Family Medicine

## 2022-08-01 ENCOUNTER — Encounter: Payer: Self-pay | Admitting: Adult Health

## 2022-08-01 DIAGNOSIS — G4733 Obstructive sleep apnea (adult) (pediatric): Secondary | ICD-10-CM

## 2022-08-01 NOTE — Addendum Note (Signed)
Addended by: Delrae Rend on: 08/01/2022 09:39 AM   Modules accepted: Orders

## 2022-08-01 NOTE — Progress Notes (Signed)
Reviewed and agree with assessment/plan.   Coralyn Helling, MD [facility] 08/01/2022, 9:59 AM Pager:  [phone]

## 2022-08-01 NOTE — Patient Instructions (Signed)
Begin CPAP at bedtime goal was to wear for more than 6 hours each night Work on healthy weight  Do not drive if sleepy  Work on healthy sleep regimen  Follow up with Dr. Craige Cotta  Or Chon Buhl in 3 months and as needed.

## 2022-08-01 NOTE — Progress Notes (Signed)
  Virtual Visit via Video Note  I connected with Barbara Kramer on 08/01/22 at  9:00 AM EDT by a video enabled telemedicine application and verified that I am speaking with the correct person using two identifiers.  Location: Patient: Home  Provider: Office    I discussed the limitations of evaluation and management by telemedicine and the availability of in person appointments. The patient expressed understanding and agreed to proceed.  History of Present Illness: 72 year old female followed for obstructive sleep apnea Today's video visit is to discuss sleep study results. Patient presents for a 64-month follow-up for sleep apnea.  Patient previously been diagnosed with moderate sleep apnea in 2019.  But was unable to tolerate CPAP.  She was admitted earlier this year for a stroke.  Continue to have ongoing snoring and restless sleep with daytime sleepiness. She was suspected to have ongoing sleep apnea.  She was set up for an in lab sleep study that was completed  July 10, 2022 that showed moderate obstructive sleep apnea with a AHI at 16.4/hour it with optimal control at CPAP 13 cm H2O. Used a full face mask-Medium size Fisher[employer]kel  for study, she liked this mask .  We discussed her sleep study results in detail.  She would like to start on CPAP.  Says that she did feel very well for the second part of the sleep study felt like that made her sleep much better.  Patient education given on sleep apnea and on CPAP care.   Observations/Objective: TEST/EVENTS :  HST on 08/10/2017 showed an AHI of 27.   in lab sleep study that was completed  July 10, 2022 that showed moderate obstructive sleep apnea with a AHI at 16.4/hour it with optimal control at CPAP 13 cm H2O. Used a full face mask-Medium size Fisher[employer]kel   Appears in no acute distress.  Assessment and Plan: Moderate obstructive sleep apnea-patient given on sleep apnea and treatment with CPAP therapy.  Patient will begin CPAP at 13  cm H2O.  - discussed how weight can impact sleep and risk for sleep disordered breathing - discussed options to assist with weight loss: combination of diet modification, cardiovascular and strength training exercises   - had an extensive discussion regarding the adverse health consequences related to untreated sleep disordered breathing - specifically discussed the risks for hypertension, coronary artery disease, cardiac dysrhythmias, cerebrovascular disease, and diabetes - lifestyle modification discussed   - discussed how sleep disruption can increase risk of accidents, particularly when driving - safe driving practices were discussed   Plan  Patient Instructions  Begin CPAP at bedtime goal was to wear for more than 6 hours each night Work on healthy weight  Do not drive if sleepy  Work on healthy sleep regimen  Follow up with Dr. Craige Cotta  Or Cherylene Ferrufino in 3 months and as needed.     Follow Up Instructions:    I discussed the assessment and treatment plan with the patient. The patient was provided an opportunity to ask questions and all were answered. The patient agreed with the plan and demonstrated an understanding of the instructions.   The patient was advised to call back or seek an in-person evaluation if the symptoms worsen or if the condition fails to improve as anticipated.  I provided 22 minutes of non-face-to-face time during this encounter.   Rubye Oaks, NP

## 2022-08-03 ENCOUNTER — Ambulatory Visit (INDEPENDENT_AMBULATORY_CARE_PROVIDER_SITE_OTHER): Payer: Medicare HMO | Admitting: Family Medicine

## 2022-08-03 ENCOUNTER — Encounter: Payer: Self-pay | Admitting: Family Medicine

## 2022-08-03 VITALS — BP 122/68 | HR 66 | Temp 97.8°F | Ht 62.0 in | Wt 266.1 lb

## 2022-08-03 DIAGNOSIS — J189 Pneumonia, unspecified organism: Secondary | ICD-10-CM | POA: Diagnosis not present

## 2022-08-03 NOTE — Patient Instructions (Signed)
Drink lots of fluids Finish the medication   We need to have you back in about 6 weeks to re check you and a chest xray    Take care of yourself  Get enough rest   If any symptoms worsen , let us know  Watch for fever / increased cough/ breathing problems    So glad you are feeling better

## 2022-08-03 NOTE — Progress Notes (Signed)
Subjective:    Patient ID: Barbara Kramer, female    DOB: 10/27/1950, 72 y.o.   MRN: [id number]  HPI Pt presents for f/u of CAP  Wt Readings from Last 3 Encounters:  08/03/22 266 lb 2 oz (120.7 kg)  07/28/22 275 lb 2 oz (124.8 kg)  07/13/22 272 lb 4 oz (123.5 kg)   48.67 kg/m  Vitals:   08/03/22 0806  BP: 122/68  Pulse: 66  Temp: 97.8 F (36.6 C)  SpO2: 99%      Last visit presented with cough /clear phlegm and chest tightness Cxr noted focal opacity in RLL with trace pleural effusion susp for pna Covid testing was negative  DG Chest 2 View  Result Date: 07/28/2022 CLINICAL DATA:  new cough since last night , clear sputum lung sounds are more harsh in R base EXAM: CHEST - 2 VIEW COMPARISON:  Radiograph 01/23/2011 FINDINGS: The heart size and mediastinal contours are within normal limits. There is a fluid level overlying the right posterior costophrenic sulcus suggesting trace effusion. There is a right lower lobe opacity anteriorly, notable on the lateral view, new from prior exam.No acute osseous abnormality. Multilevel degenerative changes of the spine. Right upper quadrant surgical clips. IMPRESSION: Trace right pleural effusion and focal opacity in the anterior right lower lobe concerning for pneumonia. Recommend follow-up PA and lateral chest radiograph in 6 weeks after treatment. Electronically Signed   By: Caprice Renshaw M.D.   On: 07/28/2022 16:20   SLEEP STUDY DOCUMENTS  Result Date: 07/12/2022 Ordered by an unspecified provider.   Feeling a lot better Has not needed the cough med for a few days  Phlegm is clear No fever No sob  No wheezing  A little nausea if the tries to do too much (helps to sit down and eat a bit)   Is needing to rest a lot    Px doxycycline (pcn all) Prednisone  Prometh dm for cough   Patient Active Problem List   Diagnosis Date Noted   CAP (community acquired pneumonia) 07/28/2022   Constipation 07/13/2022   Skin  hypopigmentation 07/05/2022   Lymph nodes enlarged 07/05/2022   Skin lesions 05/17/2022   Hyperlipidemia 05/03/2022   Intracranial carotid stenosis 04/25/2022   History of TIA (transient ischemic attack) 04/24/2022   Sciatica of left side 01/04/2022   Mobility impaired 08/01/2021   Head pain 06/28/2021   Vulvar cyst 01/25/2021   Rash of neck 08/02/2020   Essential hypertension 06/30/2020   Medicare annual wellness visit, subsequent 05/14/2020   Unilateral primary osteoarthritis, left knee 05/10/2020   Morbid obesity with body mass index (BMI) of 50.0 to 59.9 in adult [facility] 05/10/2020   Family history of colon cancer in mother    Benign neoplasm of colon    OSA (obstructive sleep apnea) 01/29/2018   Estrogen deficiency 01/29/2018   Allergic rhinitis 05/15/2017   Screening mammogram, encounter for 10/11/2016   Prediabetes 12/15/2014   Osteoarthritis of left knee 10/16/2013   Colon cancer screening 11/20/2012   Routine gynecological examination 11/17/2011   Other screening mammogram 11/17/2011   Right shoulder pain 06/05/2011   Low back pain 10/18/2010   Routine general medical examination at a health care facility 10/06/2010   ACANTHOSIS NIGRICANS 01/27/2008   Eczema 08/02/2006   Past Medical History:  Diagnosis Date   Anemia    Arthritis    Breast lump    left   Eczema    Family hx of colon cancer  Gallbladder problem    Hypertension    Kidney cysts    small, right 10/2002   Normal cardiac stress test 11/2008   exercise stress test normal but limited by exercise intolerance   Obesity    Shortness of breath    Sleep apnea    Past Surgical History:  Procedure Laterality Date   CHOLECYSTECTOMY  05/2002   [place]- gallstones, small renal cyst 05/2002   COLONOSCOPY WITH PROPOFOL N/A 07/15/2019   Procedure: COLONOSCOPY WITH PROPOFOL;  Surgeon: Benancio Deeds, MD;  Location: [facility];  Service: Gastroenterology;  Laterality: N/A;   FOOT SURGERY  11/1998   bunion    PARTIAL HYSTERECTOMY  1998   fibroids, ovaries remain   POLYPECTOMY  07/15/2019   Procedure: POLYPECTOMY;  Surgeon: Benancio Deeds, MD;  Location: [facility];  Service: Gastroenterology;;   Social History   Tobacco Use   Smoking status: Former    Packs/day: 0.25    Years: 5.00    Additional pack years: 0.00    Total pack years: 1.25    Types: Cigarettes    Quit date: 03/20/1976    Years since quitting: 46.4   Smokeless tobacco: Never  Substance Use Topics   Alcohol use: No    Alcohol/week: 0.0 standard drinks of alcohol   Drug use: No   Family History  Problem Relation Age of Onset   Colon cancer Mother        68's   Stomach cancer Mother    Cancer Mother    Lung cancer Father        smoker   Cancer Father    Hypertension Sister    Dementia Sister    Hypertension Sister    Stroke Maternal Grandmother    Heart attack Maternal Grandfather    Stroke Paternal Grandmother    Colon polyps Daughter    Allergies  Allergen Reactions   Calcium Carbonate Other (See Comments)    constipation   Penicillins Rash    Has patient had a PCN reaction causing immediate rash, facial/tongue/throat swelling, SOB or lightheadedness with hypotension: no Has patient had a PCN reaction causing severe rash involving mucus membranes or skin necrosis: no Has patient had a PCN reaction that required hospitalization: no Has patient had a PCN reaction occurring within the last 10 years: no If all of the above answers are "NO", then may proceed with Cephalosporin use.    Current Outpatient Medications on File Prior to Visit  Medication Sig Dispense Refill   aspirin EC 81 MG tablet Take 1 tablet (81 mg total) by mouth daily. Swallow whole. 30 tablet 0   atorvastatin (LIPITOR) 20 MG tablet Take 1 tablet (20 mg total) by mouth daily. 90 tablet 3   chlorthalidone (HYGROTON) 25 MG tablet Take 0.5 tablets (12.5 mg total) by mouth daily. 45 tablet 3   clopidogrel (PLAVIX) 75 MG tablet Take 1  tablet (75 mg total) by mouth daily. 90 tablet 4   doxycycline (VIBRA-TABS) 100 MG tablet Take 1 tablet (100 mg total) by mouth 2 (two) times daily. 14 tablet 0   fluticasone (FLONASE) 50 MCG/ACT nasal spray Place 2 sprays into both nostrils as needed for allergies or rhinitis.     promethazine-dextromethorphan (PROMETHAZINE-DM) 6.25-15 MG/5ML syrup Take 5 mLs by mouth 3 (three) times daily as needed for cough. 118 mL 0   triamcinolone cream (KENALOG) 0.1 % Apply 1 Application topically 2 (two) times daily. Small amount to affected area 1 g 0  No current facility-administered medications on file prior to visit.     Review of Systems  Constitutional:  Positive for fatigue. Negative for activity change, appetite change, fever and unexpected weight change.  HENT:  Negative for congestion, ear pain, rhinorrhea, sinus pressure and sore throat.   Eyes:  Negative for pain, redness and visual disturbance.  Respiratory:  Positive for cough. Negative for chest tightness, shortness of breath and wheezing.   Cardiovascular:  Negative for chest pain and palpitations.  Gastrointestinal:  Negative for abdominal pain, blood in stool, constipation and diarrhea.  Endocrine: Negative for polydipsia and polyuria.  Genitourinary:  Negative for dysuria, frequency and urgency.  Musculoskeletal:  Negative for arthralgias, back pain and myalgias.  Skin:  Negative for pallor and rash.  Allergic/Immunologic: Negative for environmental allergies.  Neurological:  Negative for dizziness, syncope and headaches.  Hematological:  Negative for adenopathy. Does not bruise/bleed easily.  Psychiatric/Behavioral:  Negative for decreased concentration and dysphoric mood. The patient is not nervous/anxious.        Objective:   Physical Exam Constitutional:      General: She is not in acute distress.    Appearance: Normal appearance. She is well-developed. She is obese. She is not ill-appearing or diaphoretic.  HENT:      Head: Normocephalic and atraumatic.     Nose: No rhinorrhea.     Mouth/Throat:     Pharynx: Oropharynx is clear.  Eyes:     General:        Right eye: No discharge.        Left eye: No discharge.     Conjunctiva/sclera: Conjunctivae normal.     Pupils: Pupils are equal, round, and reactive to light.  Neck:     Thyroid: No thyromegaly.     Vascular: No carotid bruit or JVD.  Cardiovascular:     Rate and Rhythm: Normal rate and regular rhythm.     Heart sounds: Normal heart sounds.     No gallop.  Pulmonary:     Effort: Pulmonary effort is normal. No respiratory distress.     Breath sounds: Normal breath sounds. No wheezing or rales.     Comments: Good air exch Harsh bs in R lower lung field but no rales/rhonchi or wheeze, even on forced expiration  Abdominal:     General: There is no distension or abdominal bruit.     Palpations: Abdomen is soft.  Musculoskeletal:     Cervical back: Normal range of motion and neck supple.     Right lower leg: No edema.     Left lower leg: No edema.  Lymphadenopathy:     Cervical: No cervical adenopathy.  Skin:    General: Skin is warm and dry.     Coloration: Skin is not pale.     Findings: No rash.  Neurological:     Mental Status: She is alert.     Coordination: Coordination normal.     Deep Tendon Reflexes: Reflexes are normal and symmetric. Reflexes normal.  Psychiatric:        Mood and Affect: Mood normal.           Assessment & Plan:   Problem List Items Addressed This Visit       Respiratory   CAP (community acquired pneumonia) - Primary    In pcn allergic pt - treated with doxycycline and prednisone  RLL Much improved Occ random cough/ sputum is still clear  No breathing complaints   Finished prednisone  2  more days of doxy  Unless symptoms worsen will not change abx  Update if not starting to improve in a week or if worsening  ER precautions  F/

## 2022-08-03 NOTE — Assessment & Plan Note (Signed)
In pcn allergic pt - treated with doxycycline and prednisone  RLL Much improved Occ random cough/ sputum is still clear  No breathing complaints   Finished prednisone  2 more days of doxy  Unless symptoms worsen will not change abx  Update if not starting to improve in a week or if worsening  ER precautions  F/

## 2022-08-16 ENCOUNTER — Telehealth: Payer: Self-pay | Admitting: Adult Health

## 2022-08-16 DIAGNOSIS — G4733 Obstructive sleep apnea (adult) (pediatric): Secondary | ICD-10-CM

## 2022-08-16 NOTE — Telephone Encounter (Signed)
Pt states her CPAP pressure is too high and causing her mouth to be dry

## 2022-08-21 NOTE — Telephone Encounter (Signed)
ATC X1 LVM for patient to call the office back 

## 2022-08-21 NOTE — Telephone Encounter (Signed)
Yes recommend saline nasal spray twice daily, saline nasal gel at bedtime Can decrease CPAP pressure to auto CPAP 5 to 12 cm H2O. Will need office visit in 4 weeks with CPAP download

## 2022-08-21 NOTE — Telephone Encounter (Signed)
Patient is returning phone call. Patient phone number is [phone].

## 2022-08-21 NOTE — Telephone Encounter (Signed)
Spoke with patient. She states cpap machine is causing dry mouth. She feels like pressure is too strong. She is wanting to know if settings can be decreased   Tammy can you please advise?

## 2022-08-21 NOTE — Telephone Encounter (Signed)
Called and spoke with pt letting her know the info per TP and she verbalized understanding. Order has been sent to [facility] for settings change. Pt was scheduled for an appt by front staff at next avail. Nothing further needed.

## 2022-09-01 ENCOUNTER — Telehealth: Payer: Self-pay | Admitting: Adult Health

## 2022-09-01 DIAGNOSIS — G4733 Obstructive sleep apnea (adult) (pediatric): Secondary | ICD-10-CM

## 2022-09-01 NOTE — Telephone Encounter (Signed)
Pt states she had her CPAP machine adjusted and now she wants to adjust it back to her original setting bc she has noticed a difference

## 2022-09-05 NOTE — Telephone Encounter (Signed)
Called and spoke with the pt  She states that since increased decreasing CPAP pressure, she feels like she needs to go back to higher setting  She would like for Tammy to review DL and give advice   DL printed and given to Tammy  Please advise, thanks!

## 2022-09-05 NOTE — Telephone Encounter (Signed)
Download looks very good excellent control and compliance.  No significant sleep apnea while wearing CPAP.  If she does not feel the pressure is enough can increase CPAP pressure 8 to 13 cm H2O.  Keep follow up

## 2022-09-06 NOTE — Telephone Encounter (Signed)
Spoke with pt who agreed to change pressure to 8 to 13 cm H2O. Order was sent to [facility]. Pt stated understanding. Nothing further needed at this time.

## 2022-09-12 ENCOUNTER — Ambulatory Visit (INDEPENDENT_AMBULATORY_CARE_PROVIDER_SITE_OTHER): Payer: Medicare HMO | Admitting: Family Medicine

## 2022-09-12 ENCOUNTER — Telehealth: Payer: Self-pay | Admitting: Family Medicine

## 2022-09-12 ENCOUNTER — Encounter: Payer: Self-pay | Admitting: Family Medicine

## 2022-09-12 VITALS — BP 118/64 | HR 68 | Temp 98.0°F | Ht 62.0 in | Wt 263.2 lb

## 2022-09-12 DIAGNOSIS — M1712 Unilateral primary osteoarthritis, left knee: Secondary | ICD-10-CM

## 2022-09-12 DIAGNOSIS — Z6841 Body Mass Index (BMI) 40.0 and over, adult: Secondary | ICD-10-CM

## 2022-09-12 NOTE — Progress Notes (Signed)
Subjective:    Patient ID: Barbara Kramer, female    DOB: 1950/12/07, 72 y.o.   MRN: [id number]  HPI  Here for knee pain Also mentions constipation   Wt Readings from Last 3 Encounters:  09/12/22 263 lb 3.2 oz (119.4 kg)  08/03/22 266 lb 2 oz (120.7 kg)  07/28/22 275 lb 2 oz (124.8 kg)   48.14 kg/m  Vitals:   09/12/22 1024  BP: 118/64  Pulse: 68  Temp: 98 F (36.7 C)  SpO2: 97%   Has been losing weight  Eating healthier  Baking her chicken  Fruit and vegetables   Some recent issues with constipation  Lemon ginger tea  Miralax   Pt presents for left knee pain  Wants ref to ortho (Dr Magnus Ivan in the past) Has not had injection yet   Right knee is ok   Exacerbated by activity (does not hurt when she sits) Feels like her leg is going out to the left  Her weight affects her gait as well  Getting ready to travel end of July - [place] /convention for seniors   Not a candidate for knee replacement until she looses some weight   Last ortho a/p / note 04/2020  Plan: I spoke to her in length about her left knee.  I I spoke to her about weight loss in detail.  I described what is going on with her knee.  We talked about conservative treatment measures including quad strengthening exercises as well as weight loss and potentially even steroid injections or hyaluronic acid injections.  For now, she would like to try some Voltaren gel and work on quad training exercises.  All question concerns were answered and addressed.  Follow-up can be as needed.    XR showed mod tricompartmental arthritis with varus malalignment and narrowing of medial compartment   Signing up for water aerobics which she enjoys   Patient Active Problem List   Diagnosis Date Noted   CAP (community acquired pneumonia) 07/28/2022   Constipation 07/13/2022   Skin hypopigmentation 07/05/2022   Lymph nodes enlarged 07/05/2022   Skin lesions 05/17/2022   Hyperlipidemia 05/03/2022   Intracranial  carotid stenosis 04/25/2022   History of TIA (transient ischemic attack) 04/24/2022   Sciatica of left side 01/04/2022   Mobility impaired 08/01/2021   Head pain 06/28/2021   Vulvar cyst 01/25/2021   Rash of neck 08/02/2020   Essential hypertension 06/30/2020   Medicare annual wellness visit, subsequent 05/14/2020   Unilateral primary osteoarthritis, left knee 05/10/2020   Morbid obesity (HCC) 05/10/2020   Family history of colon cancer in mother    Benign neoplasm of colon    OSA (obstructive sleep apnea) 01/29/2018   Estrogen deficiency 01/29/2018   Allergic rhinitis 05/15/2017   Screening mammogram, encounter for 10/11/2016   Prediabetes 12/15/2014   Osteoarthritis of left knee 10/16/2013   Colon cancer screening 11/20/2012   Routine gynecological examination 11/17/2011   Other screening mammogram 11/17/2011   Right shoulder pain 06/05/2011   Low back pain 10/18/2010   Routine general medical examination at a health care facility 10/06/2010   ACANTHOSIS NIGRICANS 01/27/2008   Eczema 08/02/2006   Past Medical History:  Diagnosis Date   Anemia    Arthritis    Breast lump    left   Eczema    Family hx of colon cancer    Gallbladder problem    Hypertension    Kidney cysts    small, right 10/2002   Normal  cardiac stress test 11/2008   exercise stress test normal but limited by exercise intolerance   Obesity    Shortness of breath    Sleep apnea    Past Surgical History:  Procedure Laterality Date   CHOLECYSTECTOMY  05/2002   [place]- gallstones, small renal cyst 05/2002   COLONOSCOPY WITH PROPOFOL N/A 07/15/2019   Procedure: COLONOSCOPY WITH PROPOFOL;  Surgeon: Benancio Deeds, MD;  Location: [facility];  Service: Gastroenterology;  Laterality: N/A;   FOOT SURGERY  11/1998   bunion   PARTIAL HYSTERECTOMY  1998   fibroids, ovaries remain   POLYPECTOMY  07/15/2019   Procedure: POLYPECTOMY;  Surgeon: Benancio Deeds, MD;  Location: [facility];  Service:  Gastroenterology;;   Social History   Tobacco Use   Smoking status: Former    Packs/day: 0.25    Years: 5.00    Additional pack years: 0.00    Total pack years: 1.25    Types: Cigarettes    Quit date: 03/20/1976    Years since quitting: 46.5   Smokeless tobacco: Never  Substance Use Topics   Alcohol use: No    Alcohol/week: 0.0 standard drinks of alcohol   Drug use: No   Family History  Problem Relation Age of Onset   Colon cancer Mother        28's   Stomach cancer Mother    Cancer Mother    Lung cancer Father        smoker   Cancer Father    Hypertension Sister    Dementia Sister    Hypertension Sister    Stroke Maternal Grandmother    Heart attack Maternal Grandfather    Stroke Paternal Grandmother    Colon polyps Daughter    Allergies  Allergen Reactions   Calcium Carbonate Other (See Comments)    constipation   Penicillins Rash    Has patient had a PCN reaction causing immediate rash, facial/tongue/throat swelling, SOB or lightheadedness with hypotension: no Has patient had a PCN reaction causing severe rash involving mucus membranes or skin necrosis: no Has patient had a PCN reaction that required hospitalization: no Has patient had a PCN reaction occurring within the last 10 years: no If all of the above answers are "NO", then may proceed with Cephalosporin use.    Current Outpatient Medications on File Prior to Visit  Medication Sig Dispense Refill   aspirin EC 81 MG tablet Take 1 tablet (81 mg total) by mouth daily. Swallow whole. 30 tablet 0   atorvastatin (LIPITOR) 20 MG tablet Take 1 tablet (20 mg total) by mouth daily. 90 tablet 3   chlorthalidone (HYGROTON) 25 MG tablet Take 0.5 tablets (12.5 mg total) by mouth daily. 45 tablet 3   clopidogrel (PLAVIX) 75 MG tablet Take 1 tablet (75 mg total) by mouth daily. 90 tablet 4   triamcinolone cream (KENALOG) 0.1 % Apply 1 Application topically 2 (two) times daily. Small amount to affected area 1 g 0   No  current facility-administered medications on file prior to visit.    Review of Systems  Constitutional:  Negative for activity change, appetite change, fatigue, fever and unexpected weight change.  HENT:  Negative for congestion, ear pain, rhinorrhea, sinus pressure and sore throat.   Eyes:  Negative for pain, redness and visual disturbance.  Respiratory:  Negative for cough, shortness of breath and wheezing.   Cardiovascular:  Negative for chest pain and palpitations.  Gastrointestinal:  Negative for abdominal pain, blood in stool, constipation  and diarrhea.  Endocrine: Negative for polydipsia and polyuria.  Genitourinary:  Negative for dysuria, frequency and urgency.  Musculoskeletal:  Negative for back pain and myalgias.  Skin:  Negative for pallor and rash.  Allergic/Immunologic: Negative for environmental allergies.  Neurological:  Negative for dizziness, syncope and headaches.  Hematological:  Negative for adenopathy. Does not bruise/bleed easily.  Psychiatric/Behavioral:  Negative for decreased concentration and dysphoric mood. The patient is not nervous/anxious.        Objective:   Physical Exam Constitutional:      General: She is not in acute distress.    Appearance: Normal appearance. She is well-developed. She is obese. She is not ill-appearing or diaphoretic.  HENT:     Head: Normocephalic and atraumatic.  Eyes:     Conjunctiva/sclera: Conjunctivae normal.     Pupils: Pupils are equal, round, and reactive to light.  Neck:     Thyroid: No thyromegaly.     Vascular: No carotid bruit or JVD.  Cardiovascular:     Rate and Rhythm: Normal rate and regular rhythm.     Heart sounds: Normal heart sounds.     No gallop.  Pulmonary:     Effort: Pulmonary effort is normal. No respiratory distress.     Breath sounds: Normal breath sounds. No wheezing or rales.  Abdominal:     General: There is no distension or abdominal bruit.     Palpations: Abdomen is soft.   Musculoskeletal:     Cervical back: Normal range of motion and neck supple.     Right lower leg: No edema.     Left lower leg: No edema.     Comments: Left knee: medial tenderness No swelling or effusion  No crepitus Stable  Some pain with mc murray  Normal bounce     Lymphadenopathy:     Cervical: No cervical adenopathy.  Skin:    General: Skin is warm and dry.     Coloration: Skin is not pale.     Findings: No rash.  Neurological:     Mental Status: She is alert.     Coordination: Coordination normal.     Deep Tendon Reflexes: Reflexes are normal and symmetric. Reflexes normal.  Psychiatric:        Mood and Affect: Mood normal.           Assessment & Plan:   Problem List Items Addressed This Visit       Musculoskeletal and Integument   Osteoarthritis of left knee - Primary    Pt is interested in other options for treatment before knee replacement Wants to see orthopedics and referral was done  Encouraged further weight loss/ working on that  Voltaren gel prn Ice prn  Low impact exercise May benefit from PT in the future       Relevant Orders   Ambulatory referral to Orthopedic Surgery     Other   Morbid obesity (HCC)    Discussed how this problem influences overall health and the risks it imposes  Reviewed plan for weight loss with lower calorie diet (via better food choices and also portion control or program like weight watchers) and exercise building up to or more than 30 minutes 5 days per week including some aerobic activity  Encouraged her to keep working on weight loss

## 2022-09-12 NOTE — Telephone Encounter (Signed)
Pt called back correcting the name of the specialist, his name is Dr. Clarisse Gouge. Marchwainy. Call back # [phone]

## 2022-09-12 NOTE — Patient Instructions (Addendum)
For constipation continue fruits/veggies Get at least 60 oz of fluids per day Miralax or stool softener are ok    I put the referral in  Please let us know if you don't hear in 1-2 weeks    Voltaren gel 1% over the counter is helpful  Ice helps also   If knee feels unstable you can use a compression wrap   Take care of yourself  Keep working on weight loss

## 2022-09-12 NOTE — Telephone Encounter (Signed)
Okay for new referral

## 2022-09-12 NOTE — Assessment & Plan Note (Signed)
Discussed how this problem influences overall health and the risks it imposes  Reviewed plan for weight loss with lower calorie diet (via better food choices and also portion control or program like weight watchers) and exercise building up to or more than 30 minutes 5 days per week including some aerobic activity  Encouraged her to keep working on weight loss

## 2022-09-12 NOTE — Telephone Encounter (Signed)
Patient contacted to office regarding referral sent for orthopedics. States she found an office an provider she would like to go to, [facility] to Dr. March Wiany. Patient says she has already made an appointment with their office but needs the referral sent to them for insurance purposes.

## 2022-09-12 NOTE — Telephone Encounter (Signed)
The referral is in.

## 2022-09-12 NOTE — Assessment & Plan Note (Signed)
Pt is interested in other options for treatment before knee replacement Wants to see orthopedics and referral was done  Encouraged further weight loss/ working on that  Voltaren gel prn Ice prn  Low impact exercise May benefit from PT in the future

## 2022-09-14 ENCOUNTER — Ambulatory Visit: Payer: Medicare HMO | Admitting: Family Medicine

## 2022-09-14 DIAGNOSIS — M25562 Pain in left knee: Secondary | ICD-10-CM | POA: Diagnosis not present

## 2022-09-15 ENCOUNTER — Encounter: Payer: Self-pay | Admitting: *Deleted

## 2022-09-18 ENCOUNTER — Encounter: Payer: Self-pay | Admitting: Family Medicine

## 2022-09-18 ENCOUNTER — Ambulatory Visit (INDEPENDENT_AMBULATORY_CARE_PROVIDER_SITE_OTHER)
Admission: RE | Admit: 2022-09-18 | Discharge: 2022-09-18 | Disposition: A | Discharge status: Home / Self Care | Payer: Medicare HMO | Source: Ambulatory Visit | Attending: Family Medicine | Admitting: Family Medicine

## 2022-09-18 ENCOUNTER — Ambulatory Visit (INDEPENDENT_AMBULATORY_CARE_PROVIDER_SITE_OTHER): Payer: Medicare HMO | Admitting: Family Medicine

## 2022-09-18 VITALS — BP 122/70 | HR 87 | Temp 97.5°F | Ht 62.0 in | Wt 263.6 lb

## 2022-09-18 DIAGNOSIS — R918 Other nonspecific abnormal finding of lung field: Secondary | ICD-10-CM | POA: Diagnosis not present

## 2022-09-18 DIAGNOSIS — M1712 Unilateral primary osteoarthritis, left knee: Secondary | ICD-10-CM

## 2022-09-18 DIAGNOSIS — J189 Pneumonia, unspecified organism: Secondary | ICD-10-CM

## 2022-09-18 DIAGNOSIS — R599 Enlarged lymph nodes, unspecified: Secondary | ICD-10-CM

## 2022-09-18 NOTE — Assessment & Plan Note (Signed)
Prior LN in right post auricular area seems to be gone on exam Very reassuring  Instructed pt to follow up if this returns

## 2022-09-18 NOTE — Assessment & Plan Note (Signed)
6 weeks post treatment with doxycycline and prednisone Feels back to baseline Normal exam   Cxr ordered for 6 wk re check Pending results

## 2022-09-18 NOTE — Progress Notes (Signed)
Subjective:    Patient ID: Barbara Kramer, female    DOB: 11-15-1950, 72 y.o.   MRN: [id number]  HPI  Wt Readings from Last 3 Encounters:  09/18/22 263 lb 9.6 oz (119.6 kg)  09/12/22 263 lb 3.2 oz (119.4 kg)  08/03/22 266 lb 2 oz (120.7 kg)   48.21 kg/m  Vitals:   09/18/22 1514  BP: 122/70  Pulse: 87  Temp: (!) 97.5 F (36.4 C)  SpO2: 97%     Pt presents for follow up of lCAP and eft post auricular LN At last check at end of April had decreased in size  Unsure if related to rash on neck /ears/ eczema   She thinks it is smaller  Not bothersome   Lab Results  Component Value Date   WBC 7.6 07/05/2022   HGB 13.6 07/05/2022   HCT 41.0 07/05/2022   MCV 86.3 07/05/2022   PLT 260.0 07/05/2022   She was treated with doxycycline and prednisone    Last CXR report  Narrative & Impression  CLINICAL DATA:  new cough since last night , clear sputum lung sounds are more harsh in R base   EXAM: CHEST - 2 VIEW   COMPARISON:  Radiograph 01/23/2011   FINDINGS: The heart size and mediastinal contours are within normal limits. There is a fluid level overlying the right posterior costophrenic sulcus suggesting trace effusion. There is a right lower lobe opacity anteriorly, notable on the lateral view, new from prior exam.No acute osseous abnormality. Multilevel degenerative changes of the spine. Right upper quadrant surgical clips.   IMPRESSION: Trace right pleural effusion and focal opacity in the anterior right lower lobe concerning for pneumonia. Recommend follow-up PA and lateral chest radiograph in 6 weeks after treatment.      Patient Active Problem List   Diagnosis Date Noted   CAP (community acquired pneumonia) 07/28/2022   Constipation 07/13/2022   Skin hypopigmentation 07/05/2022   Lymph nodes enlarged 07/05/2022   Skin lesions 05/17/2022   Hyperlipidemia 05/03/2022   Intracranial carotid stenosis 04/25/2022   History of TIA (transient ischemic  attack) 04/24/2022   Sciatica of left side 01/04/2022   Mobility impaired 08/01/2021   Head pain 06/28/2021   Vulvar cyst 01/25/2021   Rash of neck 08/02/2020   Essential hypertension 06/30/2020   Medicare annual wellness visit, subsequent 05/14/2020   Unilateral primary osteoarthritis, left knee 05/10/2020   Morbid obesity (HCC) 05/10/2020   Family history of colon cancer in mother    Benign neoplasm of colon    OSA (obstructive sleep apnea) 01/29/2018   Estrogen deficiency 01/29/2018   Allergic rhinitis 05/15/2017   Screening mammogram, encounter for 10/11/2016   Prediabetes 12/15/2014   Osteoarthritis of left knee 10/16/2013   Colon cancer screening 11/20/2012   Routine gynecological examination 11/17/2011   Other screening mammogram 11/17/2011   Right shoulder pain 06/05/2011   Low back pain 10/18/2010   Routine general medical examination at a health care facility 10/06/2010   ACANTHOSIS NIGRICANS 01/27/2008   Eczema 08/02/2006   Past Medical History:  Diagnosis Date   Anemia    Arthritis    Breast lump    left   Eczema    Family hx of colon cancer    Gallbladder problem    Hypertension    Kidney cysts    small, right 10/2002   Normal cardiac stress test 11/2008   exercise stress test normal but limited by exercise intolerance   Obesity  Shortness of breath    Sleep apnea    Past Surgical History:  Procedure Laterality Date   CHOLECYSTECTOMY  05/2002   [place]- gallstones, small renal cyst 05/2002   COLONOSCOPY WITH PROPOFOL N/A 07/15/2019   Procedure: COLONOSCOPY WITH PROPOFOL;  Surgeon: Benancio Deeds, MD;  Location: [facility];  Service: Gastroenterology;  Laterality: N/A;   FOOT SURGERY  11/1998   bunion   PARTIAL HYSTERECTOMY  1998   fibroids, ovaries remain   POLYPECTOMY  07/15/2019   Procedure: POLYPECTOMY;  Surgeon: Benancio Deeds, MD;  Location: [facility];  Service: Gastroenterology;;   Social History   Tobacco Use   Smoking status:  Former    Packs/day: 0.25    Years: 5.00    Additional pack years: 0.00    Total pack years: 1.25    Types: Cigarettes    Quit date: 03/20/1976    Years since quitting: 46.5   Smokeless tobacco: Never  Substance Use Topics   Alcohol use: No    Alcohol/week: 0.0 standard drinks of alcohol   Drug use: No   Family History  Problem Relation Age of Onset   Colon cancer Mother        11's   Stomach cancer Mother    Cancer Mother    Lung cancer Father        smoker   Cancer Father    Hypertension Sister    Dementia Sister    Hypertension Sister    Stroke Maternal Grandmother    Heart attack Maternal Grandfather    Stroke Paternal Grandmother    Colon polyps Daughter    Allergies  Allergen Reactions   Calcium Carbonate Other (See Comments)    constipation   Penicillins Rash    Has patient had a PCN reaction causing immediate rash, facial/tongue/throat swelling, SOB or lightheadedness with hypotension: no Has patient had a PCN reaction causing severe rash involving mucus membranes or skin necrosis: no Has patient had a PCN reaction that required hospitalization: no Has patient had a PCN reaction occurring within the last 10 years: no If all of the above answers are "NO", then may proceed with Cephalosporin use.    Current Outpatient Medications on File Prior to Visit  Medication Sig Dispense Refill   aspirin EC 81 MG tablet Take 1 tablet (81 mg total) by mouth daily. Swallow whole. 30 tablet 0   atorvastatin (LIPITOR) 20 MG tablet Take 1 tablet (20 mg total) by mouth daily. 90 tablet 3   chlorthalidone (HYGROTON) 25 MG tablet Take 0.5 tablets (12.5 mg total) by mouth daily. 45 tablet 3   clopidogrel (PLAVIX) 75 MG tablet Take 1 tablet (75 mg total) by mouth daily. 90 tablet 4   triamcinolone cream (KENALOG) 0.1 % Apply 1 Application topically 2 (two) times daily. Small amount to affected area 1 g 0   No current facility-administered medications on file prior to visit.     Review of Systems  Constitutional:  Negative for activity change, appetite change, fatigue, fever and unexpected weight change.  HENT:  Negative for congestion, ear pain, rhinorrhea, sinus pressure and sore throat.   Eyes:  Negative for pain, redness and visual disturbance.  Respiratory:  Negative for cough, shortness of breath and wheezing.   Cardiovascular:  Negative for chest pain and palpitations.  Gastrointestinal:  Negative for abdominal pain, blood in stool, constipation and diarrhea.  Endocrine: Negative for polydipsia and polyuria.  Genitourinary:  Negative for dysuria, frequency and urgency.  Musculoskeletal:  Positive for arthralgias. Negative for back pain and myalgias.       Knee pain  Skin:  Negative for pallor and rash.  Allergic/Immunologic: Negative for environmental allergies.  Neurological:  Negative for dizziness, syncope and headaches.  Hematological:  Negative for adenopathy. Does not bruise/bleed easily.  Psychiatric/Behavioral:  Negative for decreased concentration and dysphoric mood. The patient is not nervous/anxious.        Objective:   Physical Exam Constitutional:      General: She is not in acute distress.    Appearance: Normal appearance. She is well-developed. She is obese. She is not ill-appearing or diaphoretic.  HENT:     Head: Normocephalic and atraumatic.     Right Ear: External ear normal.     Left Ear: External ear normal.     Mouth/Throat:     Mouth: Mucous membranes are moist.  Eyes:     Conjunctiva/sclera: Conjunctivae normal.     Pupils: Pupils are equal, round, and reactive to light.  Neck:     Thyroid: No thyromegaly.     Vascular: No carotid bruit or JVD.     Comments: No enlarged LNs noted  Cardiovascular:     Rate and Rhythm: Normal rate and regular rhythm.     Heart sounds: Normal heart sounds.     No gallop.  Pulmonary:     Effort: Pulmonary effort is normal. No respiratory distress.     Breath sounds: Normal breath  sounds. No stridor. No wheezing, rhonchi or rales.     Comments: Good air exchange No rales or crackles  Abdominal:     General: There is no distension or abdominal bruit.     Palpations: Abdomen is soft.  Musculoskeletal:     Cervical back: Normal range of motion and neck supple.     Right lower leg: No edema.     Left lower leg: No edema.  Lymphadenopathy:     Cervical: No cervical adenopathy.  Skin:    General: Skin is warm and dry.     Coloration: Skin is not pale.     Findings: No rash.  Neurological:     Mental Status: She is alert.     Motor: No weakness.     Coordination: Coordination normal.     Deep Tendon Reflexes: Reflexes are normal and symmetric. Reflexes normal.  Psychiatric:        Mood and Affect: Mood normal.           Assessment & Plan:   Problem List Items Addressed This Visit       Respiratory   CAP (community acquired pneumonia)    6 weeks post treatment with doxycycline and prednisone Feels back to baseline Normal exam   Cxr ordered for 6 wk re check Pending results       Relevant Orders   DG Chest 2 View     Musculoskeletal and Integument   Osteoarthritis of left knee    Pt saw ortho Opted not to try steroid shot as it may affect blood pressure / she has fears of it  Interested in lubricant injection but since bone on bone may not be affective  Plans to try voltaren gel prn and update [place]         Immune and Lymphatic   Lymph nodes enlarged - Primary    Prior LN in right post auricular area seems to be gone on exam Very reassuring  Instructed pt to follow up if this returns

## 2022-09-18 NOTE — Assessment & Plan Note (Signed)
Pt saw ortho Opted not to try steroid shot as it may affect blood pressure / she has fears of it  Interested in lubricant injection but since bone on bone may not be affective  Plans to try voltaren gel prn and update [place]

## 2022-09-18 NOTE — Patient Instructions (Signed)
Chest xray today  Glad you are doing better  We will alert you when the report returns    Lymph node is improved If it swells again let us know

## 2022-10-09 ENCOUNTER — Ambulatory Visit: Payer: Medicare HMO | Admitting: Adult Health

## 2022-10-09 ENCOUNTER — Encounter: Payer: Self-pay | Admitting: Adult Health

## 2022-10-09 VITALS — BP 116/60 | HR 74 | Ht 62.0 in | Wt 264.0 lb

## 2022-10-09 DIAGNOSIS — G4733 Obstructive sleep apnea (adult) (pediatric): Secondary | ICD-10-CM | POA: Diagnosis not present

## 2022-10-09 NOTE — Progress Notes (Signed)
Reviewed and agree with assessment/plan.   Coralyn Helling, MD [facility] Pulmonary/Critical Care 10/09/2022, 9:43 AM Pager:  [phone]

## 2022-10-09 NOTE — Patient Instructions (Signed)
Continue on CPAP at bedtime goal was to wear for more than 6 hours each night Work on healthy weight  Do not drive if sleepy  Work on healthy sleep regimen  Follow up in 6 months and as needed.

## 2022-10-09 NOTE — Assessment & Plan Note (Signed)
Continue with healthy weight loss 

## 2022-10-09 NOTE — Assessment & Plan Note (Signed)
Excellent control and compliance on CPAP  - discussed how weight can impact sleep and risk for sleep disordered breathing - discussed options to assist with weight loss: combination of diet modification, cardiovascular and strength training exercises   - had an extensive discussion regarding the adverse health consequences related to untreated sleep disordered breathing - specifically discussed the risks for hypertension, coronary artery disease, cardiac dysrhythmias, cerebrovascular disease, and diabetes - lifestyle modification discussed   - discussed how sleep disruption can increase risk of accidents, particularly when driving - safe driving practices were discussed   Plan  Patient Instructions  Continue on CPAP at bedtime goal was to wear for more than 6 hours each night Work on healthy weight  Do not drive if sleepy  Work on healthy sleep regimen  Follow up in 6 months and as needed.

## 2022-10-09 NOTE — Progress Notes (Signed)
@Patient  ID: Barbara Kramer, female    DOB: 01-09-1951, 72 y.o.   MRN: [id number]  Chief Complaint  Patient presents with   Follow-up    Referring provider: Judy Pimple, MD  HPI: 72 year old female followed for obstructive sleep apnea  TEST/EVENTS :  HST on 08/10/2017 showed an AHI of 27.   In lab sleep study that was completed  July 10, 2022 that showed moderate obstructive sleep apnea with a AHI at 16.4/hour it with optimal control at CPAP 13 cm H2O.    10/09/2022 Follow up : OSA  Patient returns for 32-month follow-up.  Patient had previously been diagnosed with moderate sleep apnea in 2019 but was unable to tolerate CPAP.  She was admitted earlier this year for stroke.  Recommended to follow back up for sleep apnea management.  She complained of ongoing snoring and daytime sleepiness.  She was set up for a repeat sleep study, in lab sleep study showed moderate sleep apnea with optimal control on CPAP at 13 cm H2O.  Last visit patient was restarted on CPAP.  Patient says she is doing well on CPAP.  Feels that she is benefiting from CPAP with decreased daytime sleepiness.  CPAP download shows excellent compliance with 97% usage.  Daily average usage at 7 hours.  She is on auto CPAP 8 to 13 cm H2O.  Daily average pressure at 12.7 cm H2O.  AHI 1.1/hour. Using a full face mask.  Working on weight loss. Down 40lbs . Starting water aerobics.    Allergies  Allergen Reactions   Calcium Carbonate Other (See Comments)    constipation   Penicillins Rash    Has patient had a PCN reaction causing immediate rash, facial/tongue/throat swelling, SOB or lightheadedness with hypotension: no Has patient had a PCN reaction causing severe rash involving mucus membranes or skin necrosis: no Has patient had a PCN reaction that required hospitalization: no Has patient had a PCN reaction occurring within the last 10 years: no If all of the above answers are "NO", then may proceed with Cephalosporin  use.     Immunization History  Administered Date(s) Administered   Fluad Quad(high Dose 65+) 03/18/2019, 05/14/2020   Influenza,inj,Quad PF,6+ Mos 02/21/2018   PFIZER(Purple Top)SARS-COV-2 Vaccination 06/06/2019, 06/27/2019, 05/21/2020   Pneumococcal Conjugate-13 02/21/2018   Pneumococcal Polysaccharide-23 03/18/2019   Td 08/15/2006   Tdap 04/22/2015   Zoster Recombinant(Shingrix) 01/17/2022, 04/24/2022    Past Medical History:  Diagnosis Date   Anemia    Arthritis    Breast lump    left   Eczema    Family hx of colon cancer    Gallbladder problem    Hypertension    Kidney cysts    small, right 10/2002   Normal cardiac stress test 11/2008   exercise stress test normal but limited by exercise intolerance   Obesity    Shortness of breath    Sleep apnea    TIA (transient ischemic attack)     Tobacco History: Social History   Tobacco Use  Smoking Status Former   Current packs/day: 0.00   Average packs/day: 0.3 packs/day for 5.0 years (1.3 ttl pk-yrs)   Types: Cigarettes   Start date: 03/21/1971   Quit date: 03/20/1976   Years since quitting: 46.5  Smokeless Tobacco Never   Counseling given: Not Answered   Outpatient Medications Prior to Visit  Medication Sig Dispense Refill   atorvastatin (LIPITOR) 20 MG tablet Take 1 tablet (20 mg total) by mouth daily. 90  tablet 3   chlorthalidone (HYGROTON) 25 MG tablet Take 0.5 tablets (12.5 mg total) by mouth daily. 45 tablet 3   clopidogrel (PLAVIX) 75 MG tablet Take 1 tablet (75 mg total) by mouth daily. 90 tablet 4   triamcinolone cream (KENALOG) 0.1 % Apply 1 Application topically 2 (two) times daily. Small amount to affected area 1 g 0   aspirin EC 81 MG tablet Take 1 tablet (81 mg total) by mouth daily. Swallow whole. (Patient not taking: Reported on 10/09/2022) 30 tablet 0   No facility-administered medications prior to visit.     Review of Systems:   Constitutional:   No  weight loss, night sweats,  Fevers, chills,  fatigue, or  lassitude.  HEENT:   No headaches,  Difficulty swallowing,  Tooth/dental problems, or  Sore throat,                No sneezing, itching, ear ache, nasal congestion, post nasal drip,   CV:  No chest pain,  Orthopnea, PND, swelling in lower extremities, anasarca, dizziness, palpitations, syncope.   GI  No heartburn, indigestion, abdominal pain, nausea, vomiting, diarrhea, change in bowel habits, loss of appetite, bloody stools.   Resp: No shortness of breath with exertion or at rest.  No excess mucus, no productive cough,  No non-productive cough,  No coughing up of blood.  No change in color of mucus.  No wheezing.  No chest wall deformity  Skin: no rash or lesions.  GU: no dysuria, change in color of urine, no urgency or frequency.  No flank pain, no hematuria   MS:  No joint pain or swelling.  No decreased range of motion.  No back pain.    Physical Exam  BP 116/60 (BP Location: Left Arm, Patient Position: Sitting, Cuff Size: Large)   Pulse 74   Ht 5\' 2"  (1.575 m)   Wt 264 lb (119.7 kg)   SpO2 100%   BMI 48.29 kg/m   GEN: A/Ox3; pleasant , NAD, well nourished    HEENT:  /AT,  EACs-clear, TMs-wnl, NOSE-clear, THROAT-clear, no lesions, no postnasal drip or exudate noted.   NECK:  Supple w/ fair ROM; no JVD; normal carotid impulses w/o bruits; no thyromegaly or nodules palpated; no lymphadenopathy.    RESP  Clear  P & A; w/o, wheezes/ rales/ or rhonchi. no accessory muscle use, no dullness to percussion  CARD:  RRR, no m/r/g, no peripheral edema, pulses intact, no cyanosis or clubbing.  GI:   Soft & nt; nml bowel sounds; no organomegaly or masses detected.   Musco: Warm bil, no deformities or joint swelling noted.   Neuro: alert, no focal deficits noted.    Skin: Warm, no lesions or rashes    Lab Results:  CBC    Component Value Date/Time   WBC 7.6 07/05/2022 1027   RBC 4.76 07/05/2022 1027   HGB 13.6 07/05/2022 1027   HCT 41.0 07/05/2022 1027    PLT 260.0 07/05/2022 1027   MCV 86.3 07/05/2022 1027   MCH 27.9 04/25/2022 0702   MCHC 33.0 07/05/2022 1027   RDW 14.7 07/05/2022 1027   LYMPHSABS 1.6 07/05/2022 1027   MONOABS 0.5 07/05/2022 1027   EOSABS 0.2 07/05/2022 1027   BASOSABS 0.0 07/05/2022 1027    BMET    Component Value Date/Time   NA 139 05/08/2022 0759   K 3.5 05/08/2022 0759   CL 102 05/08/2022 0759   CO2 27 05/08/2022 0759   GLUCOSE 98 05/08/2022 0759  BUN 16 05/08/2022 0759   CREATININE 1.09 05/08/2022 0759   CALCIUM 9.9 05/08/2022 0759   GFRNONAA >60 04/25/2022 0702   GFRAA 81 (L) 05/10/2012 0324    BNP No results found for: "BNP"  ProBNP No results found for: "PROBNP"  Imaging: DG Chest 2 View  Result Date: 09/23/2022 CLINICAL DATA:  Community acquired right lower lobe pneumonia. EXAM: CHEST - 2 VIEW COMPARISON:  07/28/2022 FINDINGS: Unchanged cardiac silhouette and mediastinal contours. Questioned right lower lung heterogeneous airspace opacity has resolved in the interval. No new focal airspace opacities. No pleural effusion or pneumothorax no evidence of edema. No acute osseous abnormalities. Stigmata of dish within the thoracic spine. Postcholecystectomy. IMPRESSION: 1.  No acute cardiopulmonary disease. 2. Interval resolution of previously questioned right lower lung pneumonia. Electronically Signed   By: Simonne Come M.D.   On: 09/23/2022 14:57    Administration History     None           No data to display          No results found for: "NITRICOXIDE"      Assessment & Plan:   OSA (obstructive sleep apnea) Excellent control and compliance on CPAP  - discussed how weight can impact sleep and risk for sleep disordered breathing - discussed options to assist with weight loss: combination of diet modification, cardiovascular and strength training exercises   - had an extensive discussion regarding the adverse health consequences related to untreated sleep disordered breathing -  specifically discussed the risks for hypertension, coronary artery disease, cardiac dysrhythmias, cerebrovascular disease, and diabetes - lifestyle modification discussed   - discussed how sleep disruption can increase risk of accidents, particularly when driving - safe driving practices were discussed   Plan  Patient Instructions  Continue on CPAP at bedtime goal was to wear for more than 6 hours each night Work on healthy weight  Do not drive if sleepy  Work on healthy sleep regimen  Follow up in 6 months and as needed.     Morbid obesity (HCC) Continue with healthy weight loss     Rubye Oaks, NP 10/09/2022

## 2022-10-12 ENCOUNTER — Encounter: Payer: Self-pay | Admitting: Gastroenterology

## 2022-10-12 ENCOUNTER — Other Ambulatory Visit: Payer: Self-pay | Admitting: Gastroenterology

## 2022-10-12 ENCOUNTER — Ambulatory Visit: Payer: Medicare HMO | Admitting: Gastroenterology

## 2022-10-12 ENCOUNTER — Telehealth: Payer: Self-pay

## 2022-10-12 VITALS — BP 110/60 | HR 76 | Ht 61.25 in | Wt 263.5 lb

## 2022-10-12 DIAGNOSIS — Z86010 Personal history of colonic polyps: Secondary | ICD-10-CM | POA: Diagnosis not present

## 2022-10-12 DIAGNOSIS — K5909 Other constipation: Secondary | ICD-10-CM | POA: Diagnosis not present

## 2022-10-12 DIAGNOSIS — Z7902 Long term (current) use of antithrombotics/antiplatelets: Secondary | ICD-10-CM

## 2022-10-12 MED ORDER — PLENVU 140 G PO SOLR: 1.0000 | Freq: Once | ORAL | 0 refills | Status: AC

## 2022-10-12 NOTE — Progress Notes (Signed)
HPI :  72 y/o female with a PMH of morbid obesity, colon polyps, constipation, family history of colon cancer, here to reestablish care for some of these issues.  She was last seen in 2021.  Her last colonoscopy was in April 2021 done at the hospital, she had an advanced sessile serrated lesion removed from the hepatic flexure.  Prep was fair leading to extensive lavage.  Recall her mother had colon cancer diagnosed at age 42s, her daughter has had colon polyps diagnosed in her 30s.  She endorses a history of chronic constipation but.  She has been taking MiraLAX as needed.  She states if she uses it regularly that she can move her bowels okay.  She did have what sounds like an impaction that was treated in recent months.  She states that she tries to manage this only with diet it would be upwards of a week before she has a bowel movement.  She is taking MiraLAX usually 3-4 times per week and this leads to a bowel movement about 2 times per week.  She states going 2 times a day her stomach feels okay and she does not have any symptoms with it.  She has no blood in her stools.  Since have last seen her she did have a TIA in February.  Was placed on aspirin and Plavix at that time.  There was a question of recurrent symptoms in April for possible TIA although per neurology was not to be the case.  She has been maintained on Plavix since that time indefinitely.  Her BMI is currently 49.39.  Previously when I saw her BMI was in the 50s.  She has been trying to work on weight loss through diet, has definitely lost weight since the last time of seeing her.  She denies any cardiopulmonary symptoms currently.  She is otherwise feeling well today without complaints    Colonoscopy 01/2007 - normal, Dr. Juanda Chance   Colonoscopy 07/15/19: - Preparation of the colon was fair, extensive lavage performed to obtain mostly adequate views. - One 12 mm polyp at the hepatic flexure, removed with a cold snare. Resected and  retrieved. - One 4 mm polyp at the recto-sigmoid colon, removed with a cold snare. Resected and retrieved. - Internal hemorrhoids. - The examination was otherwise normal.  FINAL MICROSCOPIC DIAGNOSIS:   A. RECTAL, SIGMOID, POLYPECTOMY:  - Hyperplastic polyp.  - There is no evidence of malignancy.   B. HEPATIC FLEXURE, POLYPECTOMY:  - Sessile serrated polyp without cytologic dysplasia.  - High-grade dysplasia is not identified.    Echo 04/26/2022: EF 60-65%, grade I DD   Past Medical History:  Diagnosis Date   Anemia    Arthritis    Breast lump    left   Eczema    Family hx of colon cancer    Gallbladder problem    Hypertension    Kidney cysts    small, right 10/2002   Normal cardiac stress test 11/2008   exercise stress test normal but limited by exercise intolerance   Obesity    Shortness of breath    Sleep apnea    CPAP   TIA (transient ischemic attack)      Past Surgical History:  Procedure Laterality Date   BUNIONECTOMY Right 11/19/1998   CHOLECYSTECTOMY  05/19/2002   [place]- gallstones, small renal cyst 05/2002   COLONOSCOPY WITH PROPOFOL N/A 07/15/2019   Procedure: COLONOSCOPY WITH PROPOFOL;  Surgeon: Benancio Deeds, MD;  Location: [facility];  Service: Gastroenterology;  Laterality: N/A;   PARTIAL HYSTERECTOMY  03/20/1996   fibroids, ovaries remain   POLYPECTOMY  07/15/2019   Procedure: POLYPECTOMY;  Surgeon: Benancio Deeds, MD;  Location: [facility];  Service: Gastroenterology;;   Family History  Problem Relation Age of Onset   Colon cancer Mother        43's   Stomach cancer Mother    Lung cancer Father        smoker   Hypertension Sister    Dementia Sister    Hypertension Sister    Other Sister        died at brith   Stroke Maternal Grandmother    Heart attack Maternal Grandfather    Stroke Paternal Grandmother    Colon polyps Daughter    Irritable bowel syndrome Daughter    Other Brother        died at brith   Hypertension Son     Sleep apnea Son    Social History   Tobacco Use   Smoking status: Former    Average packs/day: 0.3 packs/day for 5.0 years (1.3 ttl pk-yrs)    Types: Cigarettes    Start date: 03/21/1971   Smokeless tobacco: Never  Vaping Use   Vaping status: Never Used  Substance Use Topics   Alcohol use: No    Alcohol/week: 0.0 standard drinks of alcohol   Drug use: No   Current Outpatient Medications  Medication Sig Dispense Refill   atorvastatin (LIPITOR) 20 MG tablet Take 1 tablet (20 mg total) by mouth daily. 90 tablet 3   chlorthalidone (HYGROTON) 25 MG tablet Take 0.5 tablets (12.5 mg total) by mouth daily. 45 tablet 3   clopidogrel (PLAVIX) 75 MG tablet Take 1 tablet (75 mg total) by mouth daily. 90 tablet 4   triamcinolone cream (KENALOG) 0.1 % Apply 1 Application topically 2 (two) times daily. Small amount to affected area 1 g 0   No current facility-administered medications for this visit.   Allergies  Allergen Reactions   Calcium Carbonate Other (See Comments)    constipation   Penicillins Rash    Has patient had a PCN reaction causing immediate rash, facial/tongue/throat swelling, SOB or lightheadedness with hypotension: no Has patient had a PCN reaction causing severe rash involving mucus membranes or skin necrosis: no Has patient had a PCN reaction that required hospitalization: no Has patient had a PCN reaction occurring within the last 10 years: no If all of the above answers are "NO", then may proceed with Cephalosporin use.      Review of Systems: All systems reviewed and negative except where noted in HPI.   Lab Results  Component Value Date   WBC 7.6 07/05/2022   HGB 13.6 07/05/2022   HCT 41.0 07/05/2022   MCV 86.3 07/05/2022   PLT 260.0 07/05/2022    Lab Results  Component Value Date   NA 139 05/08/2022   CL 102 05/08/2022   K 3.5 05/08/2022   CO2 27 05/08/2022   BUN 16 05/08/2022   CREATININE 1.09 05/08/2022   GFR 51.89 (L) 05/08/2022   CALCIUM 9.9  05/08/2022   PHOS 4.0 12/28/2008   ALBUMIN 4.1 05/08/2022   GLUCOSE 98 05/08/2022    Lab Results  Component Value Date   ALT 11 05/08/2022   AST 18 05/08/2022   ALKPHOS 65 05/08/2022   BILITOT 0.7 05/08/2022     Physical Exam: BP 110/60 (BP Location: Left Arm, Patient Position: Sitting, Cuff Size: Large)  Pulse 76   Ht 5' 1.25" (1.556 m)   Wt 263 lb 8 oz (119.5 kg)   BMI 49.39 kg/m  Constitutional: Pleasant,well-developed, female in no acute distress. HEENT: Normocephalic and atraumatic. Conjunctivae are normal. No scleral icterus. Neck supple.  Cardiovascular: Normal rate, regular rhythm.  Pulmonary/chest: Effort normal and breath sounds normal.  Abdominal: Soft, nondistended, protuberant, nontender. There are no masses palpable.  Extremities: no edema Lymphadenopathy: No cervical adenopathy noted. Neurological: Alert and oriented to person place and time. Skin: Skin is warm and dry. No rashes noted. Psychiatric: Normal mood and affect. Behavior is normal.   ASSESSMENT: 72 y.o. female here for assessment of the following  1. History of colon polyps   2. Antiplatelet or antithrombotic long-term use   3. Chronic constipation    Mother had colon cancer at a young age, last exam in 2021 showed an advanced sessile serrated lesion, significant time lavaging the colon due to fair prep on that exam. She has chronic constipation.  She is due for surveillance colonoscopy at this time. Since I have seen her she had a TIA last February but has recovered from that, on Plavix at this time. We will need approval from her Neurologist to hold Plavix for 5 days prior to the exam. During that time it would be okay for her to take a baby aspirin. We will tentatively do this in August / Sept time frame.   We discussed her chronic constipation. Miralax is helping, she can continue that. She is having 2 BMs per day but that is adequate enough to make her comfortable. She can titrate up or  down as needed. I think she would benefit from a 2 day prep to ensure adequate preparation given limitations of her last prep. She agrees. Otherwise she wishes to continue Miralax for now, can contact me if she wishes to escalate her therapy.    PLAN: - colonoscopy at the [facility] given BMI now < 50 - will seek approval to hold Plavix for 5 days prior to the exam.  - recommend she take baby aspirin 81mg  while holding Plavix prior to colonoscopy - will plan on her exam to be done in August / Sept time frame. Double prep will be used given fair prep on the last exam - discussed constipation in general - recommend she continue Miralax if that works for her, titrate up or down as needed. If she wishes to use something else she can contact us  Harlin Rain, MD [facility]  CC: Tower, Audrie Gallus, MD

## 2022-10-12 NOTE — Telephone Encounter (Signed)
   KACEY DYSERT 1951-02-09 [id number]  Dear Dr. Marjory Lies:  We have scheduled the above named patient for a(n) Colonoscopy procedure. Our records show that (s)he is on anticoagulation therapy.  Please advise as to whether the patient may come off their therapy of Plavix 5 days prior to their procedure which is scheduled for 11-17-22.  Please route your response to Berlinda Last, CMA or fax response to [phone].  Sincerely,    [facility]

## 2022-10-12 NOTE — Patient Instructions (Addendum)
You have been scheduled for a colonoscopy. Please follow written instructions given to you at your visit today.  Please pick up your prep supplies at the pharmacy within the next 1-3 days. We have sent Medicare coupon codes to [facility] for Plenvu  If you use inhalers (even only as needed), please bring them with you on the day of your procedure.  DO NOT TAKE 7 DAYS PRIOR TO TEST- Trulicity (dulaglutide) Ozempic, Wegovy (semaglutide) Mounjaro (tirzepatide) Bydureon Bcise (exanatide extended release)  DO NOT TAKE 1 DAY PRIOR TO YOUR TEST Rybelsus (semaglutide) Adlyxin (lixisenatide) Victoza (liraglutide) Byetta (exanatide) ___________________________________________________________________________  Bonita Quin will be contacted by our office prior to your procedure for directions on holding your Plavix.  If you do not hear from our office 1 week prior to your scheduled procedure, please call [phone] to discuss.   You can take an 81 mg aspirin while you are holding your Plavix.  Continue Miralax.  Thank you for entrusting me with your care and for choosing [facility], Dr. Ileene Patrick

## 2022-10-13 ENCOUNTER — Telehealth: Payer: Self-pay | Admitting: Gastroenterology

## 2022-10-13 NOTE — Telephone Encounter (Signed)
Pt said the prep is too expensive and her insurance will not cover it.  Needs an alternative med or to do a PA

## 2022-10-13 NOTE — Telephone Encounter (Signed)
Contacted pt & offered pt to come in office to pick up Plenvu sample. Pt agreed to come by and pick Plenvu sample up once she comes back from out of town.

## 2022-10-18 ENCOUNTER — Telehealth: Payer: Self-pay | Admitting: Neurology

## 2022-10-18 NOTE — Telephone Encounter (Signed)
Received a letter from [facility] that pt is scheduled 11/17/2022 for colonoscopy. Pt was last seen here 06/28/2022. Last known stroke like symptoms were 06/22/2022. She has been compliant with plavix for treatment.  May hold plavix 5 days prior to the procedure. Please resume after procedure.

## 2022-10-18 NOTE — Telephone Encounter (Signed)
Received an Epic telephone note from [facility]. "May hold plavix for 5 days prior to the procedure. Please resume after procedure".   Called and spoke to patient. She understands to hold Plavix starting on 8-25. She also indicated that she will come to pick up the Plenvu sample DD offered her next week when she gets back in town.

## 2022-10-18 NOTE — Telephone Encounter (Signed)
See phone note from 10-18-22

## 2022-11-07 DIAGNOSIS — L309 Dermatitis, unspecified: Secondary | ICD-10-CM | POA: Diagnosis not present

## 2022-11-07 DIAGNOSIS — L308 Other specified dermatitis: Secondary | ICD-10-CM | POA: Diagnosis not present

## 2022-11-07 DIAGNOSIS — T63441A Toxic effect of venom of bees, accidental (unintentional), initial encounter: Secondary | ICD-10-CM | POA: Diagnosis not present

## 2022-11-12 ENCOUNTER — Encounter: Payer: Self-pay | Admitting: Certified Registered Nurse Anesthetist

## 2022-11-17 ENCOUNTER — Encounter: Payer: Self-pay | Admitting: Gastroenterology

## 2022-11-17 ENCOUNTER — Ambulatory Visit (AMBULATORY_SURGERY_CENTER): Payer: Medicare HMO | Admitting: Gastroenterology

## 2022-11-17 VITALS — BP 140/76 | HR 88 | Temp 97.7°F | Resp 19 | Ht 61.0 in | Wt 261.2 lb

## 2022-11-17 DIAGNOSIS — Z86010 Personal history of colonic polyps: Secondary | ICD-10-CM | POA: Diagnosis not present

## 2022-11-17 DIAGNOSIS — G4733 Obstructive sleep apnea (adult) (pediatric): Secondary | ICD-10-CM | POA: Diagnosis not present

## 2022-11-17 DIAGNOSIS — D120 Benign neoplasm of cecum: Secondary | ICD-10-CM | POA: Diagnosis not present

## 2022-11-17 DIAGNOSIS — K6389 Other specified diseases of intestine: Secondary | ICD-10-CM | POA: Diagnosis not present

## 2022-11-17 DIAGNOSIS — I10 Essential (primary) hypertension: Secondary | ICD-10-CM | POA: Diagnosis not present

## 2022-11-17 DIAGNOSIS — Z09 Encounter for follow-up examination after completed treatment for conditions other than malignant neoplasm: Secondary | ICD-10-CM

## 2022-11-17 MED ORDER — SODIUM CHLORIDE 0.9 % IV SOLN: 500.0000 mL | Freq: Once | INTRAVENOUS | Status: DC

## 2022-11-17 NOTE — Progress Notes (Signed)
Called to room to assist during endoscopic procedure.  Patient ID and intended procedure confirmed with present staff. Received instructions for my participation in the procedure from the performing physician.  

## 2022-11-17 NOTE — Patient Instructions (Signed)
Resume previous diet and medications. Restart Plavix tomorrow. Repeat Colonoscopy in 5 years for surveillance.  YOU HAD AN ENDOSCOPIC PROCEDURE TODAY AT THE [facility]:   Refer to the procedure report that was given to you for any specific questions about what was found during the examination.  If the procedure report does not answer your questions, please call your gastroenterologist to clarify.  If you requested that your care partner not be given the details of your procedure findings, then the procedure report has been included in a sealed envelope for you to review at your convenience later.  YOU SHOULD EXPECT: Some feelings of bloating in the abdomen. Passage of more gas than usual.  Walking can help get rid of the air that was put into your GI tract during the procedure and reduce the bloating. If you had a lower endoscopy (such as a colonoscopy or flexible sigmoidoscopy) you may notice spotting of blood in your stool or on the toilet paper. If you underwent a bowel prep for your procedure, you may not have a normal bowel movement for a few days.  Please Note:  You might notice some irritation and congestion in your nose or some drainage.  This is from the oxygen used during your procedure.  There is no need for concern and it should clear up in a day or so.  SYMPTOMS TO REPORT IMMEDIATELY:  Following lower endoscopy (colonoscopy or flexible sigmoidoscopy):  Excessive amounts of blood in the stool  Significant tenderness or worsening of abdominal pains  Swelling of the abdomen that is new, acute  Fever of 100F or higher  For urgent or emergent issues, a gastroenterologist can be reached at any hour by calling [phone]. Do not use MyChart messaging for urgent concerns.    DIET:  We do recommend a small meal at first, but then you may proceed to your regular diet.  Drink plenty of fluids but you should avoid alcoholic beverages for 24 hours.  ACTIVITY:  You should plan  to take it easy for the rest of today and you should NOT DRIVE or use heavy machinery until tomorrow (because of the sedation medicines used during the test).    FOLLOW UP: Our staff will call the number listed on your records the next business day following your procedure.  We will call around 7:15- 8:00 am to check on you and address any questions or concerns that you may have regarding the information given to you following your procedure. If we do not reach you, we will leave a message.     If any biopsies were taken you will be contacted by phone or by letter within the next 1-3 weeks.  Please call us at [phone] if you have not heard about the biopsies in 3 weeks.    SIGNATURES/CONFIDENTIALITY: You and/or your care partner have signed paperwork which will be entered into your electronic medical record.  These signatures attest to the fact that that the information above on your After Visit Summary has been reviewed and is understood.  Full responsibility of the confidentiality of this discharge information lies with you and/or your care-partner.

## 2022-11-17 NOTE — Progress Notes (Signed)
1422 Ephedrine 10 mg given IV due to low BP, MD updated.

## 2022-11-17 NOTE — Progress Notes (Signed)
Report given to PACU, vss 

## 2022-11-17 NOTE — Progress Notes (Signed)
[facility] History and Physical   Primary Care Physician:  Tower, Audrie Gallus, MD   Reason for Procedure:   History of colon polyps  Plan:    colonoscopy     HPI: Barbara Kramer is a 72 y.o. 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 Mother had CRC age 25s, her daughter has had polyps at a young age as well. Patient has chronic constipation, double prep used for this exam. On plavix for history of TIA which has been held for 5 days.   Otherwise feels well without any cardiopulmonary symptoms.   I have discussed risks / benefits of anesthesia and endoscopic procedure with Barbara Kramer and they wish to proceed with the exams as outlined today.    Past Medical History:  Diagnosis Date   Anemia    Arthritis    Breast lump    left   Eczema    Family hx of colon cancer    Gallbladder problem    Hypertension    Kidney cysts    small, right 10/2002   Normal cardiac stress test 11/2008   exercise stress test normal but limited by exercise intolerance   Obesity    Shortness of breath    Sleep apnea    CPAP   TIA (transient ischemic attack)     Past Surgical History:  Procedure Laterality Date   BUNIONECTOMY Right 11/19/1998   CHOLECYSTECTOMY  05/19/2002   [place]- gallstones, small renal cyst 05/2002   COLONOSCOPY WITH PROPOFOL N/A 07/15/2019   Procedure: COLONOSCOPY WITH PROPOFOL;  Surgeon: Benancio Deeds, MD;  Location: [facility];  Service: Gastroenterology;  Laterality: N/A;   PARTIAL HYSTERECTOMY  03/20/1996   fibroids, ovaries remain   POLYPECTOMY  07/15/2019   Procedure: POLYPECTOMY;  Surgeon: Benancio Deeds, MD;  Location: [facility];  Service: Gastroenterology;;    Prior to Admission medications   Medication Sig Start Date End Date Taking? Authorizing Provider  aspirin EC (ASPIRIN 81) 81 MG tablet  11/11/22  Yes [provider]  atorvastatin (LIPITOR) 20 MG tablet Take 1 tablet (20 mg  total) by mouth daily. 05/17/22  Yes Tower, Audrie Gallus, MD  chlorthalidone (HYGROTON) 25 MG tablet Take 0.5 tablets (12.5 mg total) by mouth daily. 05/03/22  Yes Tower, Audrie Gallus, MD  triamcinolone cream (KENALOG) 0.1 % Apply 1 Application topically 2 (two) times daily. Small amount to affected area 07/05/22  Yes Tower, Audrie Gallus, MD  clopidogrel (PLAVIX) 75 MG tablet Take 1 tablet (75 mg total) by mouth daily. 06/28/22   Penumalli, Glenford Bayley, MD    Current Outpatient Medications  Medication Sig Dispense Refill   aspirin EC (ASPIRIN 81) 81 MG tablet      atorvastatin (LIPITOR) 20 MG tablet Take 1 tablet (20 mg total) by mouth daily. 90 tablet 3   chlorthalidone (HYGROTON) 25 MG tablet Take 0.5 tablets (12.5 mg total) by mouth daily. 45 tablet 3   triamcinolone cream (KENALOG) 0.1 % Apply 1 Application topically 2 (two) times daily. Small amount to affected area 1 g 0   clopidogrel (PLAVIX) 75 MG tablet Take 1 tablet (75 mg total) by mouth daily. 90 tablet 4   Current Facility-Administered Medications  Medication Dose Route Frequency Provider Last Rate Last Admin   0.9 %  sodium chloride infusion  500 mL Intravenous Once Barbara Kramer, Willaim Rayas, MD        Allergies as of 11/17/2022 - Review Complete 11/17/2022  Allergen  Reaction Noted   Calcium carbonate Other (See Comments)    Penicillins Rash 08/02/2006    Family History  Problem Relation Age of Onset   Colon cancer Mother        25's   Stomach cancer Mother    Lung cancer Father        smoker   Hypertension Sister    Dementia Sister    Hypertension Sister    Other Sister        died at [employer]   Other Brother        died at brith   Stroke Maternal Grandmother    Heart attack Maternal Grandfather    Stroke Paternal Grandmother    Colon polyps Daughter    Irritable bowel syndrome Daughter    Hypertension Son    Sleep apnea Son    Esophageal cancer Neg Hx    Rectal cancer Neg Hx     Social History   Socioeconomic History   Marital  status: Single    Spouse name: Not on file   Number of children: 2   Years of education: Not on file   Highest education level: Some college, no degree  Occupational History   Occupation: Retired    Associate Professor: [employer]  Tobacco Use   Smoking status: Former    Average packs/day: 0.3 packs/day for 5.0 years (1.3 ttl pk-yrs)    Types: Cigarettes    Start date: 03/21/1971   Smokeless tobacco: Never  Vaping Use   Vaping status: Never Used  Substance and Sexual Activity   Alcohol use: No    Alcohol/week: 0.0 standard drinks of alcohol   Drug use: No   Sexual activity: Not Currently  Other Topics Concern   Not on file  Social History Narrative   Not on file   Social Determinants of Health   Financial Resource Strain: Low Risk  (06/23/2022)   Overall Financial Resource Strain (CARDIA)    Difficulty of Paying Living Expenses: Not hard at all  Food Insecurity: No Food Insecurity (06/23/2022)   Hunger Vital Sign    Worried About Running Out of Food in the Last Year: Never true    Ran Out of Food in the Last Year: Never true  Transportation Needs: No Transportation Needs (06/23/2022)   PRAPARE - Administrator, Civil Service (Medical): No    Lack of Transportation (Non-Medical): No  Physical Activity: Inactive (06/23/2022)   Exercise Vital Sign    Days of Exercise per Week: 0 days    Minutes of Exercise per Session: 0 min  Stress: No Stress Concern Present (06/23/2022)   [employer] of Occupational Health - Occupational Stress Questionnaire    Feeling of Stress : Not at all  Social Connections: Moderately Integrated (06/23/2022)   Social Connection and Isolation Panel [NHANES]    Frequency of Communication with Friends and Family: More than three times a week    Frequency of Social Gatherings with Friends and Family: Twice a week    Attends Religious Services: More than 4 times per year    Active Member of [employer] or Organizations: Yes    Attends Tax inspector Meetings: More than 4 times per year    Marital Status: Divorced  Intimate Partner Violence: Not At Risk (05/18/2022)   Humiliation, Afraid, Rape, and Kick questionnaire    Fear of Current or Ex-Partner: No    Emotionally Abused: No    Physically Abused: No    Sexually Abused:  No    Review of Systems: All other review of systems negative except as mentioned in the HPI.  Physical Exam: Vital signs BP (!) 147/70   Pulse 62   Temp 97.7 F (36.5 C)   Ht 5\' 1"  (1.549 m)   Wt 261 lb 3.2 oz (118.5 kg)   SpO2 96%   BMI 49.35 kg/m   General:   Alert,  Well-developed, pleasant and cooperative in NAD Lungs:  Clear throughout to auscultation.   Heart:  Regular rate and rhythm Abdomen:  Soft, nontender and nondistended.   Neuro/Psych:  Alert and cooperative. Normal mood and affect. A and O x 3  Harlin Rain, MD [facility]

## 2022-11-17 NOTE — Progress Notes (Signed)
1427 Ephedrine 10 mg given IV due to low BP, MD updated.

## 2022-11-17 NOTE — Op Note (Addendum)
[facility] Patient Name: Barbara Kramer Procedure Date: 11/17/2022 2:01 PM MRN: [id number] Endoscopist: Viviann Spare P. Adela Lank , MD, [id number] Age: 72 Referring MD:  Date of Birth: April 21, 1950 Gender: Female Account #: [id number] Procedure:                Colonoscopy Indications:              High risk colon cancer surveillance: Personal                            history of colonic polyps - advanced sessile                            serrated polyp removed 06/2019, mother had colon                            cancer dx age 91s Medicines:                Monitored Anesthesia Care Procedure:                Pre-Anesthesia Assessment:                           - Prior to the procedure, a History and Physical                            was performed, and patient medications and                            allergies were reviewed. The patient's tolerance of                            previous anesthesia was also reviewed. The risks                            and benefits of the procedure and the sedation                            options and risks were discussed with the patient.                            All questions were answered, and informed consent                            was obtained. Prior Anticoagulants: The patient has                            taken Plavix (clopidogrel), last dose was 5 days                            prior to procedure. ASA Grade Assessment: III - A                            patient with severe systemic disease. After  reviewing the risks and benefits, the patient was                            deemed in satisfactory condition to undergo the                            procedure.                           After obtaining informed consent, the colonoscope                            was passed under direct vision. Throughout the                            procedure, the patient's blood pressure, pulse, and                             oxygen saturations were monitored continuously. The                            CF [id number] #[id number] was introduced through the anus                            and advanced to the the cecum, identified by                            appendiceal orifice and ileocecal valve. The                            colonoscopy was performed without difficulty. The                            patient tolerated the procedure well. The quality                            of the bowel preparation was adequate. The                            ileocecal valve, appendiceal orifice, and rectum                            were photographed. Scope In: 2:13:41 PM Scope Out: 2:32:07 PM Scope Withdrawal Time: 0 hours 12 minutes 48 seconds  Total Procedure Duration: 0 hours 18 minutes 26 seconds  Findings:                 The perianal and digital rectal examinations were                            normal.                           A diminutive polyp was found in the cecum. The  polyp was sessile. The polyp was removed with a                            cold snare. Resection and retrieval were complete.                           Anal papilla(e) were hypertrophied.                           Internal hemorrhoids were found during                            retroflexion. The hemorrhoids were small.                           The exam was otherwise without abnormality. Complications:            No immediate complications. Estimated blood loss:                            Minimal. Estimated Blood Loss:     Estimated blood loss was minimal. Impression:               - One diminutive polyp in the cecum, removed with a                            cold snare. Resected and retrieved.                           - Anal papilla(e) were hypertrophied.                           - Internal hemorrhoids.                           - The examination was otherwise normal. Recommendation:           - Patient has  a contact number available for                            emergencies. The signs and symptoms of potential                            delayed complications were discussed with the                            patient. Return to normal activities tomorrow.                            Written discharge instructions were provided to the                            patient.                           - Resume previous diet.                           -  Continue present medications.                           - Resume Plavix tomorrow                           - Await pathology results.                           - Repeat colonoscopy in 5 years for surveillance. Viviann Spare P. Adela Lank, MD 11/17/2022 2:35:51 PM This report has been signed electronically.

## 2022-11-21 ENCOUNTER — Telehealth: Payer: Self-pay

## 2022-11-21 NOTE — Telephone Encounter (Signed)
  Follow up Call-     11/17/2022    1:17 PM  Call back number  Post procedure Call Back phone  # [phone]  Permission to leave phone message Yes     Patient questions:  Do you have a fever, pain , or abdominal swelling? No. Pain Score  0 *  Have you tolerated food without any problems? Yes.    Have you been able to return to your normal activities? Yes.    Do you have any questions about your discharge instructions: Diet   No. Medications  No. Follow up visit  No.  Do you have questions or concerns about your Care? No.  Actions: * If pain score is 4 or above: No action needed, pain <4.

## 2022-11-23 ENCOUNTER — Ambulatory Visit (INDEPENDENT_AMBULATORY_CARE_PROVIDER_SITE_OTHER): Payer: Medicare HMO | Admitting: Family Medicine

## 2022-11-23 ENCOUNTER — Encounter: Payer: Self-pay | Admitting: Family Medicine

## 2022-11-23 VITALS — BP 104/62 | HR 65 | Temp 97.8°F | Ht 62.0 in | Wt 266.2 lb

## 2022-11-23 DIAGNOSIS — R109 Unspecified abdominal pain: Secondary | ICD-10-CM | POA: Diagnosis not present

## 2022-11-23 DIAGNOSIS — S29012A Strain of muscle and tendon of back wall of thorax, initial encounter: Secondary | ICD-10-CM | POA: Diagnosis not present

## 2022-11-23 LAB — POC URINALSYSI DIPSTICK (AUTOMATED)
Bilirubin, UA: NEGATIVE
Blood, UA: NEGATIVE
Glucose, UA: NEGATIVE
Ketones, UA: NEGATIVE
Leukocytes, UA: NEGATIVE
Nitrite, UA: NEGATIVE
Protein, UA: NEGATIVE
Spec Grav, UA: 1.015 (ref 1.010–1.025)
Urobilinogen, UA: 0.2 U/dL
pH, UA: 6 (ref 5.0–8.0)

## 2022-11-23 NOTE — Patient Instructions (Signed)
Heat, massage and start home physical therapy. Use lumbar support and try to correct positioning car. Work on [employer] management to decrease impact on back.

## 2022-11-23 NOTE — Progress Notes (Signed)
Patient ID: Barbara Kramer, female    DOB: 1950-05-28, 72 y.o.   MRN: [id number]  This visit was conducted in person.  BP 104/62 (BP Location: Right Arm, Patient Position: Sitting, Cuff Size: Large)   Pulse 65   Temp 97.8 F (36.6 C) (Temporal)   Ht 5\' 2"  (1.575 m)   Wt 266 lb 4 oz (120.8 kg)   SpO2 99%   BMI 48.70 kg/m    CC:  Chief Complaint  Patient presents with   Back Pain    Lower Right Back    Subjective:   HPI: Barbara Kramer is a 72 y.o. female  pt of Dr. Milinda Antis presenting on 11/23/2022 for Back Pain (Lower Right Back)  2 months ago new  onset pain in right  mid back/side... gradually getting worse  Pain sharp at times, momentary pain...  more with sitting in a position, walking  No radiation of pain to leg.  No proceeding injury or falls.  No new numbness or weakness.     Sits in side ways position in car, twiosted to the left... has to sit this way given  issue with car.   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 always has urinary frequency.  Drinks lots of water.  No dysuria, no blood in urine.    Has not taken any med for pain.  Relevant past medical, surgical, family and social history reviewed and updated as indicated. Interim medical history since our last visit reviewed. Allergies and medications reviewed and updated. Outpatient Medications Prior to Visit  Medication Sig Dispense Refill   aspirin EC (ASPIRIN 81) 81 MG tablet      atorvastatin (LIPITOR) 20 MG tablet Take 1 tablet (20 mg total) by mouth daily. 90 tablet 3   chlorthalidone (HYGROTON) 25 MG tablet Take 0.5 tablets (12.5 mg total) by mouth daily. 45 tablet 3   clopidogrel (PLAVIX) 75 MG tablet Take 1 tablet (75 mg total) by mouth daily. (Patient not taking: Reported on 11/23/2022) 90 tablet 4   triamcinolone cream (KENALOG) 0.1 % Apply 1 Application topically 2 (two) times daily. Small amount to affected area 1 g 0   No facility-administered  medications prior to visit.     Per HPI unless specifically indicated in ROS section below Review of Systems  Constitutional:  Negative for fatigue and fever.  HENT:  Negative for congestion.   Eyes:  Negative for pain.  Respiratory:  Negative for cough and shortness of breath.   Cardiovascular:  Negative for chest pain, palpitations and leg swelling.  Gastrointestinal:  Negative for abdominal pain.  Genitourinary:  Negative for dysuria and vaginal bleeding.  Musculoskeletal:  Positive for back pain.  Neurological:  Negative for syncope, light-headedness and headaches.  Psychiatric/Behavioral:  Negative for dysphoric mood.    Objective:  BP 104/62 (BP Location: Right Arm, Patient Position: Sitting, Cuff Size: Large)   Pulse 65   Temp 97.8 F (36.6 C) (Temporal)   Ht 5\' 2"  (1.575 m)   Wt 266 lb 4 oz (120.8 kg)   SpO2 99%   BMI 48.70 kg/m   Wt Readings from Last 3 Encounters:  11/23/22 266 lb 4 oz (120.8 kg)  11/17/22 261 lb 3.2 oz (118.5 kg)  10/12/22 263 lb 8 oz (119.5 kg)      Physical Exam Constitutional:      General: She is not in acute distress.  Appearance: Normal appearance. She is well-developed. She is obese. She is not ill-appearing or toxic-appearing.  HENT:     Head: Normocephalic.     Right Ear: Hearing, tympanic membrane, ear canal and external ear normal. Tympanic membrane is not erythematous, retracted or bulging.     Left Ear: Hearing, tympanic membrane, ear canal and external ear normal. Tympanic membrane is not erythematous, retracted or bulging.     Nose: No mucosal edema or rhinorrhea.     Right Sinus: No maxillary sinus tenderness or frontal sinus tenderness.     Left Sinus: No maxillary sinus tenderness or frontal sinus tenderness.     Mouth/Throat:     Mouth: Oropharynx is clear and moist and mucous membranes are normal.     Pharynx: Uvula midline.  Eyes:     General: Lids are normal. Lids are everted, no foreign bodies appreciated.      Extraocular Movements: EOM normal.     Conjunctiva/sclera: Conjunctivae normal.     Pupils: Pupils are equal, round, and reactive to light.  Neck:     Thyroid: No thyroid mass or thyromegaly.     Vascular: No carotid bruit.     Trachea: Trachea normal.  Cardiovascular:     Rate and Rhythm: Normal rate and regular rhythm.     Pulses: Normal pulses.     Heart sounds: Normal heart sounds, S1 normal and S2 normal. No murmur heard.    No friction rub. No gallop.  Pulmonary:     Effort: Pulmonary effort is normal. No tachypnea or respiratory distress.     Breath sounds: Normal breath sounds. No decreased breath sounds, wheezing, rhonchi or rales.  Abdominal:     General: Bowel sounds are normal.     Palpations: Abdomen is soft.     Tenderness: There is no abdominal tenderness.  Musculoskeletal:     Cervical back: Normal, normal range of motion and neck supple.     Thoracic back: No tenderness or bony tenderness. Normal range of motion.     Lumbar back: Normal. No tenderness or bony tenderness. Normal range of motion. Negative right straight leg raise test and negative left straight leg raise test.  Skin:    General: Skin is warm, dry and intact.     Findings: No rash.  Neurological:     Mental Status: She is alert and oriented to person, place, and time.     Cranial Nerves: Cranial nerves 2-12 are intact.     Sensory: Sensation is intact.     Motor: Motor function is intact.  Psychiatric:        Mood and Affect: Mood is not anxious or depressed.        Speech: Speech normal.        Behavior: Behavior normal. Behavior is cooperative.        Thought Content: Thought content normal.        Cognition and Memory: Cognition and memory normal.        Judgment: Judgment normal.       Results for orders placed or performed in visit on 11/23/22  POCT Urinalysis Dipstick (Automated)  Result Value Ref Range   Color, UA Yellow    Clarity, UA Clear    Glucose, UA Negative Negative    Bilirubin, UA Negative    Ketones, UA Negative    Spec Grav, UA 1.015 1.010 - 1.025   Blood, UA Negative    pH, UA 6.0 5.0 - 8.0   Protein,  UA Negative Negative   Urobilinogen, UA 0.2 0.2 or 1.0 E.U./dL   Nitrite, UA Negative    Leukocytes, UA Negative Negative    Assessment and Plan  Strain of thoracic back region Assessment & Plan: Acute, no evidence of ongoing bladder infection, kidney infection or kidney stone per her normal UA. Most likely musculoskeletal strain.  Recommended starting heat, massage and home physical therapy.  Information provided. Also discussed lumbar support and correcting position in motor vehicle.  Return and ER precautions provided.   Right flank pain -     POCT Urinalysis Dipstick (Automated)  Morbid obesity (HCC) Assessment & Plan: Chronic, encouraged weight management given this is likely in part contributing to back issues.     No follow-ups on file.   Kerby Nora, MD

## 2022-11-23 NOTE — Assessment & Plan Note (Signed)
Chronic, encouraged weight management given this is likely in part contributing to back issues.

## 2022-11-23 NOTE — Assessment & Plan Note (Signed)
Acute, no evidence of ongoing bladder infection, kidney infection or kidney stone per her normal UA. Most likely musculoskeletal strain.  Recommended starting heat, massage and home physical therapy.  Information provided. Also discussed lumbar support and correcting position in motor vehicle.  Return and ER precautions provided.

## 2022-11-28 ENCOUNTER — Encounter: Payer: Self-pay | Admitting: Gastroenterology

## 2022-12-21 ENCOUNTER — Telehealth: Payer: Self-pay | Admitting: Family Medicine

## 2022-12-21 NOTE — Telephone Encounter (Signed)
Thanks for letting me know  If she took too much chlorthalidone she will likely urinate more today  Watch out for low blood pressure (dizzy when she stands or feeling faint) -usually corresponds to blood pressure at 90/50or less  Hydrate well today  Keep [place] posted   If any severe symptoms go to ER

## 2022-12-21 NOTE — Telephone Encounter (Signed)
Patient stated that she think she two of the same pills. She stated that she took chlorthalidone (HYGROTON) 25 MG tablet but think she took either an aspirin or a whole chlorthalidone (HYGROTON) 25 MG tablet instead of a half. She wants to know if she should look out for something. She can be reached at [phone]. Thank you!

## 2022-12-21 NOTE — Telephone Encounter (Signed)
Pt notified of Dr. Royden Purl comments and verbalized understanding. Pt will keep an eye on BP and sxs and increase water intake. Pt feels fine now but will keep [place] posted

## 2023-03-02 DIAGNOSIS — H52213 Irregular astigmatism, bilateral: Secondary | ICD-10-CM | POA: Diagnosis not present

## 2023-03-02 DIAGNOSIS — H524 Presbyopia: Secondary | ICD-10-CM | POA: Diagnosis not present

## 2023-03-02 DIAGNOSIS — H25013 Cortical age-related cataract, bilateral: Secondary | ICD-10-CM | POA: Diagnosis not present

## 2023-03-02 DIAGNOSIS — H5201 Hypermetropia, right eye: Secondary | ICD-10-CM | POA: Diagnosis not present

## 2023-03-02 DIAGNOSIS — H5212 Myopia, left eye: Secondary | ICD-10-CM | POA: Diagnosis not present

## 2023-03-02 DIAGNOSIS — H2513 Age-related nuclear cataract, bilateral: Secondary | ICD-10-CM | POA: Diagnosis not present

## 2023-03-02 DIAGNOSIS — Z0100 Encounter for examination of eyes and vision without abnormal findings: Secondary | ICD-10-CM | POA: Diagnosis not present

## 2023-03-22 ENCOUNTER — Ambulatory Visit: Payer: Medicare HMO | Admitting: Family Medicine

## 2023-03-22 ENCOUNTER — Ambulatory Visit (INDEPENDENT_AMBULATORY_CARE_PROVIDER_SITE_OTHER)
Admission: RE | Admit: 2023-03-22 | Discharge: 2023-03-22 | Disposition: A | Discharge status: Home / Self Care | Payer: Medicare HMO | Source: Ambulatory Visit | Attending: Family Medicine | Admitting: Family Medicine

## 2023-03-22 ENCOUNTER — Telehealth: Payer: Self-pay | Admitting: *Deleted

## 2023-03-22 ENCOUNTER — Encounter: Payer: Self-pay | Admitting: Family Medicine

## 2023-03-22 VITALS — BP 134/64 | HR 71 | Temp 97.9°F | Ht 62.0 in | Wt 268.5 lb

## 2023-03-22 DIAGNOSIS — M7989 Other specified soft tissue disorders: Secondary | ICD-10-CM | POA: Diagnosis not present

## 2023-03-22 DIAGNOSIS — M25571 Pain in right ankle and joints of right foot: Secondary | ICD-10-CM | POA: Diagnosis not present

## 2023-03-22 NOTE — Patient Instructions (Signed)
 You may have injured or irritated your achilles tendon   Use a cold compress for 10 minutes any chance you get  When you / elevate feet if you can   You can try voltaren gel 1% over the counter as directed for discomfort   Xray now  We will make a plan based on result   We will reach out by mychart with that   If symptoms worsen in the meantime let us  know

## 2023-03-22 NOTE — Assessment & Plan Note (Addendum)
 In achilles area after wearing a sandal in sept that zipped up the back (? If it was stiff or injured the tendon) but no trauma otherwise Has swelling initially (this comes and goes/none today) Reassuring exam  Xray of ankle ordered : noted some calcification overlying distal achilles (possible calcific tendinosis) and tiny calcaneal spur  Recommend cold compress prn / elevation  May need heel lift in shoe if dorsiflexion bothers it  Voltaren gel prn  Ref to sport med

## 2023-03-22 NOTE — Progress Notes (Signed)
 Subjective:    Patient ID: Barbara Kramer Dec, female    DOB: 06/05/1950, 73 y.o.   MRN: [id number]  HPI  Wt Readings from Last 3 Encounters:  03/22/23 268 lb 8 oz (121.8 kg)  11/23/22 266 lb 4 oz (120.8 kg)  11/17/22 261 lb 3.2 oz (118.5 kg)   49.11 kg/m  Vitals:   03/22/23 0926  BP: 134/64  Pulse: 71  Temp: 97.9 F (36.6 C)  SpO2: 95%    Pt presents with c/o right ankle pain   She wore some sandals in sept and they bothered her  (mostly flat)  No trauma and no  Since then right ankle has been sore on and off  This foot has a pin in it (had bunion surgery in the 90s) - had a shot in it once since then   Achilles area and either side of it  ? If needs ref to ortho  Ankle was initially swollen/less now  Unsure if it turned red or pink  Not warm to the touch   Usually does not when not walking/using it  Sometimes it hurts to walk and sometimes not  Not keeping her up at night   She does flex her ankle to stretch calf at pool exercise class   No medicine for this  No ice/heat or wrap   Imaging DG Ankle Complete Right Result Date: 03/22/2023 CLINICAL DATA:  Achilles pain since 11/2022. Worse in a shoe with a zipper in the back. No trauma. Mild swelling. EXAM: RIGHT ANKLE - COMPLETE 3+ VIEW COMPARISON:  Right foot radiographs 04/02/2017 FINDINGS: The ankle mortise is symmetric and intact. Mild chronic enthesopathic change at the medial aspect of the medial malleolus. Tiny plantar calcaneal heel spur. There is a 2 mm calcific density posterior to the mid height of the calcaneus. Joint spaces are preserved. No acute fracture or dislocation. Partial visualization of K-wire within the distal first metatarsal status post prior osteotomy as seen on prior foot radiographs. IMPRESSION: 1. Tiny plantar calcaneal heel spur. 2. Minimal calcification overlying the distal most aspect of the Achilles tendon inserting on the calcaneus, likely from mild chronic calcific tendinosis. 3.  Mild chronic enthesopathic change at the medial aspect of the medial malleolus. Electronically Signed   By: Tanda Lyons M.D.   On: 03/22/2023 13:28      Patient Active Problem List   Diagnosis Date Noted   Right ankle pain 03/22/2023   Strain of thoracic back region 11/23/2022   CAP (community acquired pneumonia) 07/28/2022   Constipation 07/13/2022   Skin hypopigmentation 07/05/2022   Lymph nodes enlarged 07/05/2022   Skin lesions 05/17/2022   Hyperlipidemia 05/03/2022   Intracranial carotid stenosis 04/25/2022   History of TIA (transient ischemic attack) 04/24/2022   Sciatica of left side 01/04/2022   Mobility impaired 08/01/2021   Head pain 06/28/2021   Vulvar cyst 01/25/2021   Rash of neck 08/02/2020   Essential hypertension 06/30/2020   Medicare annual wellness visit, subsequent 05/14/2020   Unilateral primary osteoarthritis, left knee 05/10/2020   Morbid obesity (HCC) 05/10/2020   Family history of colon cancer in mother    Benign neoplasm of colon    OSA (obstructive sleep apnea) 01/29/2018   Estrogen deficiency 01/29/2018   Allergic rhinitis 05/15/2017   Screening mammogram, encounter for 10/11/2016   Prediabetes 12/15/2014   Osteoarthritis of left knee 10/16/2013   Colon cancer screening 11/20/2012   Encounter for routine gynecological examination 11/17/2011   Other screening mammogram 11/17/2011  Right shoulder pain 06/05/2011   Low back pain 10/18/2010   Routine general medical examination at a health care facility 10/06/2010   ACANTHOSIS NIGRICANS 01/27/2008   Eczema 08/02/2006   Past Medical History:  Diagnosis Date   Anemia    Arthritis    Breast lump    left   Eczema    Family hx of colon cancer    Gallbladder problem    Hypertension    Kidney cysts    small, right 10/2002   Normal cardiac stress test 11/2008   exercise stress test normal but limited by exercise intolerance   Obesity    Shortness of breath    Sleep apnea    CPAP   TIA  (transient ischemic attack)    Past Surgical History:  Procedure Laterality Date   BUNIONECTOMY Right 11/19/1998   CHOLECYSTECTOMY  05/19/2002   US - gallstones, small renal cyst 05/2002   COLONOSCOPY WITH PROPOFOL  N/A 07/15/2019   Procedure: COLONOSCOPY WITH PROPOFOL ;  Surgeon: Leigh Elspeth SQUIBB, MD;  Location: [facility];  Service: Gastroenterology;  Laterality: N/A;   PARTIAL HYSTERECTOMY  03/20/1996   fibroids, ovaries remain   POLYPECTOMY  07/15/2019   Procedure: POLYPECTOMY;  Surgeon: Leigh Elspeth SQUIBB, MD;  Location: [facility];  Service: Gastroenterology;;   Social History   Tobacco Use   Smoking status: Former    Average packs/day: 0.3 packs/day for 5.0 years (1.3 ttl pk-yrs)    Types: Cigarettes    Start date: 03/21/1971   Smokeless tobacco: Never  Vaping Use   Vaping status: Never Used  Substance Use Topics   Alcohol use: No    Alcohol/week: 0.0 standard drinks of alcohol   Drug use: No   Family History  Problem Relation Age of Onset   Colon cancer Mother        2's   Stomach cancer Mother    Lung cancer Father        smoker   Hypertension Sister    Dementia Sister    Hypertension Sister    Other Sister        died at brith   Other Brother        died at brith   Stroke Maternal Grandmother    Heart attack Maternal Grandfather    Stroke Paternal Grandmother    Colon polyps Daughter    Irritable bowel syndrome Daughter    Hypertension Son    Sleep apnea Son    Esophageal cancer Neg Hx    Rectal cancer Neg Hx    Allergies  Allergen Reactions   Calcium  Carbonate Other (See Comments)    constipation   Penicillins Rash    Has patient had a PCN reaction causing immediate rash, facial/tongue/throat swelling, SOB or lightheadedness with hypotension: no Has patient had a PCN reaction causing severe rash involving mucus membranes or skin necrosis: no Has patient had a PCN reaction that required hospitalization: no Has patient had a PCN reaction  occurring within the last 10 years: no If all of the above answers are NO, then may proceed with Cephalosporin use.    Current Outpatient Medications on File Prior to Visit  Medication Sig Dispense Refill   aspirin  EC (ASPIRIN  81) 81 MG tablet      atorvastatin  (LIPITOR) 20 MG tablet Take 1 tablet (20 mg total) by mouth daily. 90 tablet 3   chlorthalidone  (HYGROTON ) 25 MG tablet Take 0.5 tablets (12.5 mg total) by mouth daily. 45 tablet 3   No current  facility-administered medications on file prior to visit.    Review of Systems  Constitutional:  Negative for activity change, appetite change, fatigue, fever and unexpected weight change.  HENT:  Negative for congestion, ear pain, rhinorrhea, sinus pressure and sore throat.   Eyes:  Negative for pain, redness and visual disturbance.  Respiratory:  Negative for cough, shortness of breath and wheezing.   Cardiovascular:  Negative for chest pain and palpitations.  Gastrointestinal:  Negative for abdominal pain, blood in stool, constipation and diarrhea.  Endocrine: Negative for polydipsia and polyuria.  Genitourinary:  Negative for dysuria, frequency and urgency.  Musculoskeletal:  Negative for arthralgias, back pain and myalgias.       Right ankle pain posteriorly  Skin:  Negative for pallor and rash.  Allergic/Immunologic: Negative for environmental allergies.  Neurological:  Negative for dizziness, syncope and headaches.  Hematological:  Negative for adenopathy. Does not bruise/bleed easily.  Psychiatric/Behavioral:  Negative for decreased concentration and dysphoric mood. The patient is not nervous/anxious.        Objective:   Physical Exam Constitutional:      General: She is not in acute distress.    Appearance: Normal appearance. She is obese. She is not ill-appearing.  Musculoskeletal:     Right ankle: Normal.     Left ankle: No swelling, deformity or ecchymosis. Normal range of motion. Anterior drawer test negative.  Normal pulse.     Left Achilles Tendon: Tenderness present. No defects. Thompson's test negative.     Comments: Very mild tenderness of mid achilles  No mass or swelling noted Normal rom of ankle today   Baseline edema (lymphedema) of lower legs  Skin:    Coloration: Skin is not pale.     Findings: No bruising, erythema or rash.  Neurological:     Mental Status: She is alert.     Deep Tendon Reflexes: Reflexes normal.  Psychiatric:        Mood and Affect: Mood normal.           Assessment & Plan:   Problem List Items Addressed This Visit       Other   Right ankle pain - Primary   In achilles area after wearing a sandal in sept that zipped up the back (? If it was stiff or injured the tendon) but no trauma otherwise Has swelling initially (this comes and goes/none today) Reassuring exam  Xray of ankle ordered : noted some calcification overlying distal achilles (possible calcific tendinosis) and tiny calcaneal spur  Recommend cold compress prn / elevation  May need heel lift in shoe if dorsiflexion bothers it  Voltaren gel prn  Ref to sport med         Relevant Orders   DG Ankle Complete Right (Completed)

## 2023-03-22 NOTE — Telephone Encounter (Signed)
 Pt viewed xray results already but just needs an appt with Dr. Patsy Lager to eval ankle pain. Please schedule an appt with Dr. Patsy Lager with pt. Thanks

## 2023-03-23 NOTE — Telephone Encounter (Signed)
 Appointment scheduled.

## 2023-03-27 NOTE — Progress Notes (Signed)
 Meila Berke T. Bertie Mcconathy, MD, CAQ Sports [facility] [address] [place], [address]  Phone: [phone]  FAX: [phone]  Barbara Kramer - 73 y.o. female  MRN [id number]  Date of Birth: 1951/02/07  Date: 03/28/2023  PCP: Randeen Laine LABOR, MD  Referral: Randeen Laine LABOR, MD  Chief Complaint  Patient presents with   Ankle Pain    Right   Subjective:   Barbara Kramer is a 73 y.o. very pleasant female patient with Body mass index is 49.38 kg/m. who presents with the following:  The patient presents with some ongoing ankle pain.  She was seen by Dr. Randeen on March 22, 2023 with some acute right-sided ankle pain.  On chart review, it looks as if she has had some question of possible right-sided Achilles tendinopathy and tenderness.  She is a very well-known patient who I have known for many years, and she presents with some medial sided ankle pain.  She relates that she was wearing some sandals roughly in August and September and that point she started to have some swelling in the medial and lateral ankle.  Right now, she is predominantly having posteromedial ankle pain.  There is some minimal swelling.  No bruising.  No focal acute injury.  No numbness or tingling  Review of Systems is noted in the HPI, as appropriate  Objective:   BP 114/60 (BP Location: Left Arm, Patient Position: Sitting, Cuff Size: Large)   Pulse 63   Temp 97.6 F (36.4 C) (Temporal)   Ht 5' 2 (1.575 m)   Wt 270 lb (122.5 kg)   SpO2 97%   BMI 49.38 kg/m   GEN: No acute distress; alert,appropriate. PULM: Breathing comfortably in no respiratory distress PSYCH: Normally interactive.   Right foot and ankle: Nontender at all toes and all metatarsals.  The midfoot is entirely nontender.  The talus is nontender.  Calcaneus is nontender.  Medial lateral malleolus as well as the tibia and fibula are nontender. She has no tenderness at the Achilles tendon  or plantar fascia She does have tenderness along the posterior tibialis throughout its course distally around through the posterior of the medial malleolus.  Laboratory and Imaging Data:  Assessment and Plan:     ICD-10-CM   1. Tibialis posterior tendonitis, right  M76.821      Classic tibialis posterior tendinopathy.  I gave her a basic rehab program to work on her posterior tibialis and peroneal tendons as well as the intrinsic muscles of the feet.  I would anticipate she will do well.  Also gave her an ASO ankle brace to use when she is up and about.  Patient Instructions  Voltaren 1% gel, over the counter You can apply up to 4 times a day  This can be applied to any joint: knee, wrist, fingers, elbows, shoulders, feet and ankles. Can apply to any tendon: tennis elbow, achilles, tendon, rotator cuff or any other tendon.  Minimal is absorbed in the bloodstream: ok with oral anti-inflammatory or a blood thinner.  Cost is about 9 dollars    Medication Management during today's office visit: No orders of the defined types were placed in this encounter.  There are no discontinued medications.  Orders placed today for conditions managed today: No orders of the defined types were placed in this encounter.   Disposition: As needed only  [employer] One speech-to-text software was used for transcription in this dictation.  Possible transcriptional  errors can occur using Animal nutritionist.   Signed,  Jacques DASEN. Shon Indelicato, MD   Outpatient Encounter Medications as of 03/28/2023  Medication Sig   aspirin  EC (ASPIRIN  81) 81 MG tablet    atorvastatin  (LIPITOR) 20 MG tablet Take 1 tablet (20 mg total) by mouth daily.   chlorthalidone  (HYGROTON ) 25 MG tablet Take 0.5 tablets (12.5 mg total) by mouth daily.   No facility-administered encounter medications on file as of 03/28/2023.

## 2023-03-28 ENCOUNTER — Ambulatory Visit (INDEPENDENT_AMBULATORY_CARE_PROVIDER_SITE_OTHER): Payer: Medicare HMO | Admitting: Family Medicine

## 2023-03-28 VITALS — BP 114/60 | HR 63 | Temp 97.6°F | Ht 62.0 in | Wt 270.0 lb

## 2023-03-28 DIAGNOSIS — M76821 Posterior tibial tendinitis, right leg: Secondary | ICD-10-CM | POA: Diagnosis not present

## 2023-03-28 DIAGNOSIS — M7661 Achilles tendinitis, right leg: Secondary | ICD-10-CM

## 2023-03-28 NOTE — Patient Instructions (Signed)
 Voltaren 1% gel, over the counter ?You can apply up to 4 times a day ? ?This can be applied to any joint: knee, wrist, fingers, elbows, shoulders, feet and ankles. ?Can apply to any tendon: tennis elbow, achilles, tendon, rotator cuff or any other tendon. ? ?Minimal is absorbed in the bloodstream: ok with oral anti-inflammatory or a blood thinner. ? ?Cost is about 9 dollars  ?

## 2023-03-30 ENCOUNTER — Encounter: Payer: Self-pay | Admitting: Family Medicine

## 2023-04-06 ENCOUNTER — Other Ambulatory Visit: Payer: Self-pay | Admitting: Family Medicine

## 2023-04-11 ENCOUNTER — Encounter: Payer: Self-pay | Admitting: Adult Health

## 2023-04-11 ENCOUNTER — Telehealth: Payer: Medicare HMO | Admitting: Adult Health

## 2023-04-11 DIAGNOSIS — G4733 Obstructive sleep apnea (adult) (pediatric): Secondary | ICD-10-CM | POA: Diagnosis not present

## 2023-04-11 NOTE — Progress Notes (Signed)
Virtual Visit via Video Note  I connected with Barbara Kramer on 04/11/23 at  8:30 AM EST by a video enabled telemedicine application and verified that I am speaking with the correct person using two identifiers.  Location: Patient: Home  Provider: Office   I discussed the limitations of evaluation and management by telemedicine and the availability of in person appointments. The patient expressed understanding and agreed to proceed.  History of Present Illness: 73 yo female followed for obstructive sleep apnea Medical history significant for stroke  Today's video visit is a 90-month follow-up for sleep apnea.  Patient says she is doing very well on her CPAP machine.  She wears her CPAP machine every single night.  She feels that she benefits from CPAP with decreased daytime sleepiness.  She uses a fullface mask.  Patient continues to try to be active and is working on weight loss.  Her CPAP download shows excellent compliance with 100% usage.  Daily usage at 8 hours patient is on auto CPAP 8 to 13 cm H2O.  AHI is 0.7/hour with daily average pressure of 12.8 cm H2O..    Observations/Objective: HST on 08/10/2017 showed an AHI of 27.    In lab sleep study that was completed  July 10, 2022 that showed moderate obstructive sleep apnea with a AHI at 16.4/hour it with optimal control at CPAP 13 cm H2O.  04/11/2023 -appears well in no acute distress.   Assessment and Plan: Obstructive sleep apnea-patient had excellent control and compliance.  She has perceived benefit.  Continue on current settings with CPAP.  CPAP care was discussed in detail.  Morbid obesity continue with healthy weight loss  Plan  Patient Instructions  Continue on CPAP at bedtime goal was to wear for more than 6 hours each night Work on healthy weight  Do not drive if sleepy  Work on healthy sleep regimen  Follow up in 1 year and As needed      Follow Up Instructions:    I discussed the assessment and treatment  plan with the patient. The patient was provided an opportunity to ask questions and all were answered. The patient agreed with the plan and demonstrated an understanding of the instructions.   The patient was advised to call back or seek an in-person evaluation if the symptoms worsen or if the condition fails to improve as anticipated.  I provided 20  minutes of non-face-to-face time during this encounter.   Rubye Oaks, NP

## 2023-04-11 NOTE — Patient Instructions (Signed)
Continue on CPAP at bedtime goal was to wear for more than 6 hours each night Work on healthy weight  Do not drive if sleepy  Work on healthy sleep regimen  Follow up in 1 year and As needed

## 2023-05-13 ENCOUNTER — Telehealth: Payer: Self-pay | Admitting: Family Medicine

## 2023-05-13 DIAGNOSIS — E7800 Pure hypercholesterolemia, unspecified: Secondary | ICD-10-CM

## 2023-05-13 DIAGNOSIS — Z0000 Encounter for general adult medical examination without abnormal findings: Secondary | ICD-10-CM

## 2023-05-13 DIAGNOSIS — R7303 Prediabetes: Secondary | ICD-10-CM

## 2023-05-13 DIAGNOSIS — I10 Essential (primary) hypertension: Secondary | ICD-10-CM

## 2023-05-13 NOTE — Telephone Encounter (Signed)
-----   Message from Vincenza Hews sent at 05/03/2023  2:12 PM EST ----- Regarding: Lab Mon 05/14/23 Hello,  Patient is coming in for CPE labs on Monday 05/14/23. Can we get orders please.   Thanks

## 2023-05-14 ENCOUNTER — Other Ambulatory Visit (INDEPENDENT_AMBULATORY_CARE_PROVIDER_SITE_OTHER): Payer: Medicare HMO

## 2023-05-14 ENCOUNTER — Other Ambulatory Visit: Payer: Medicare HMO

## 2023-05-14 DIAGNOSIS — E7800 Pure hypercholesterolemia, unspecified: Secondary | ICD-10-CM | POA: Diagnosis not present

## 2023-05-14 DIAGNOSIS — I10 Essential (primary) hypertension: Secondary | ICD-10-CM | POA: Diagnosis not present

## 2023-05-14 DIAGNOSIS — R7303 Prediabetes: Secondary | ICD-10-CM

## 2023-05-14 LAB — COMPREHENSIVE METABOLIC PANEL
ALT: 14 U/L (ref 0–35)
AST: 17 U/L (ref 0–37)
Albumin: 4.3 g/dL (ref 3.5–5.2)
Alkaline Phosphatase: 72 U/L (ref 39–117)
BUN: 17 mg/dL (ref 6–23)
CO2: 31 meq/L (ref 19–32)
Calcium: 10 mg/dL (ref 8.4–10.5)
Chloride: 102 meq/L (ref 96–112)
Creatinine, Ser: 0.92 mg/dL (ref 0.40–1.20)
GFR: 63.14 mL/min (ref >60.00)
Glucose, Bld: 103 mg/dL — ABNORMAL HIGH (ref 70–99)
Potassium: 4 meq/L (ref 3.5–5.1)
Sodium: 140 meq/L (ref 135–145)
Total Bilirubin: 0.6 mg/dL (ref 0.2–1.2)
Total Protein: 7.5 g/dL (ref 6.0–8.3)

## 2023-05-14 LAB — CBC WITH DIFFERENTIAL/PLATELET
Basophils Absolute: 0 10*3/uL (ref 0.0–0.1)
Basophils Relative: 0.5 % (ref 0.0–3.0)
Eosinophils Absolute: 0.2 10*3/uL (ref 0.0–0.7)
Eosinophils Relative: 2.2 % (ref 0.0–5.0)
HCT: 40.5 % (ref 36.0–46.0)
Hemoglobin: 13.1 g/dL (ref 12.0–15.0)
Lymphocytes Relative: 30.2 % (ref 12.0–46.0)
Lymphs Abs: 2.2 10*3/uL (ref 0.7–4.0)
MCHC: 32.3 g/dL (ref 30.0–36.0)
MCV: 88.4 fL (ref 78.0–100.0)
Monocytes Absolute: 0.4 10*3/uL (ref 0.1–1.0)
Monocytes Relative: 6 % (ref 3.0–12.0)
Neutro Abs: 4.4 10*3/uL (ref 1.4–7.7)
Neutrophils Relative %: 61.1 % (ref 43.0–77.0)
Platelets: 231 10*3/uL (ref 150.0–400.0)
RBC: 4.58 Mil/uL (ref 3.87–5.11)
RDW: 15.5 % (ref 11.5–15.5)
WBC: 7.3 10*3/uL (ref 4.0–10.5)

## 2023-05-14 LAB — LIPID PANEL
Cholesterol: 126 mg/dL (ref 0–200)
HDL: 63.4 mg/dL (ref >39.00)
LDL Cholesterol: 54 mg/dL (ref 0–99)
NonHDL: 63.04
Total CHOL/HDL Ratio: 2
Triglycerides: 47 mg/dL (ref 0.0–149.0)
VLDL: 9.4 mg/dL (ref 0.0–40.0)

## 2023-05-14 LAB — TSH: TSH: 2.18 u[IU]/mL (ref 0.35–5.50)

## 2023-05-15 ENCOUNTER — Other Ambulatory Visit: Payer: Medicare HMO

## 2023-05-15 LAB — HEMOGLOBIN A1C
Est. average glucose Bld gHb Est-mCnc: 88 mg/dL
Hgb A1c MFr Bld: 4.7 % — ABNORMAL LOW (ref 4.8–5.6)

## 2023-05-22 ENCOUNTER — Ambulatory Visit (INDEPENDENT_AMBULATORY_CARE_PROVIDER_SITE_OTHER): Payer: Medicare HMO | Admitting: Family Medicine

## 2023-05-22 ENCOUNTER — Encounter: Payer: Self-pay | Admitting: Family Medicine

## 2023-05-22 VITALS — BP 116/68 | HR 67 | Temp 98.1°F | Ht 61.0 in | Wt 273.2 lb

## 2023-05-22 DIAGNOSIS — I6529 Occlusion and stenosis of unspecified carotid artery: Secondary | ICD-10-CM | POA: Diagnosis not present

## 2023-05-22 DIAGNOSIS — E7800 Pure hypercholesterolemia, unspecified: Secondary | ICD-10-CM

## 2023-05-22 DIAGNOSIS — E2839 Other primary ovarian failure: Secondary | ICD-10-CM

## 2023-05-22 DIAGNOSIS — Z0000 Encounter for general adult medical examination without abnormal findings: Secondary | ICD-10-CM

## 2023-05-22 DIAGNOSIS — Z131 Encounter for screening for diabetes mellitus: Secondary | ICD-10-CM

## 2023-05-22 DIAGNOSIS — Z23 Encounter for immunization: Secondary | ICD-10-CM | POA: Diagnosis not present

## 2023-05-22 DIAGNOSIS — G4733 Obstructive sleep apnea (adult) (pediatric): Secondary | ICD-10-CM | POA: Diagnosis not present

## 2023-05-22 DIAGNOSIS — Z1211 Encounter for screening for malignant neoplasm of colon: Secondary | ICD-10-CM

## 2023-05-22 DIAGNOSIS — Z1231 Encounter for screening mammogram for malignant neoplasm of breast: Secondary | ICD-10-CM

## 2023-05-22 DIAGNOSIS — E559 Vitamin D deficiency, unspecified: Secondary | ICD-10-CM

## 2023-05-22 DIAGNOSIS — I10 Essential (primary) hypertension: Secondary | ICD-10-CM

## 2023-05-22 NOTE — Assessment & Plan Note (Signed)
 Lab Results  Component Value Date   HGBA1C 4.7 (L) 05/14/2023   HGBA1C 4.8 05/08/2022   HGBA1C 4.7 (L) 04/25/2022   Good control with diet  disc imp of low glycemic diet and wt loss to prevent DM2

## 2023-05-22 NOTE — Assessment & Plan Note (Signed)
 Disc goals for lipids and reasons to control them Rev last labs with pt Rev low sat fat diet in detail  Improved HDL  Plan ot continue atorvastatin 20 mg daily

## 2023-05-22 NOTE — Assessment & Plan Note (Signed)
 bp in fair control at this time  BP Readings from Last 1 Encounters:  05/22/23 116/68   No changes needed Most recent labs reviewed  Disc lifstyle change with low sodium diet and exercise  Continues chlorthalidone 12.5 mg daily and tolerates it well

## 2023-05-22 NOTE — Assessment & Plan Note (Signed)
 Mammogram planned for April

## 2023-05-22 NOTE — Assessment & Plan Note (Signed)
 Discussed how this problem influences overall health and the risks it imposes  Reviewed plan for weight loss with lower calorie diet (via better food choices (lower glycemic and portion control) along with exercise building up to or more than 30 minutes 5 days per week including some aerobic activity and strength training

## 2023-05-22 NOTE — Assessment & Plan Note (Signed)
 Dexa will be due 02/2024 for 5 y check  Last one normal

## 2023-05-22 NOTE — Assessment & Plan Note (Signed)
 With history of TIA Pt unclear if she was supposed to start plavix back  Plans to call neuro today  On asa 81 mg daily  No new neuro changes

## 2023-05-22 NOTE — Assessment & Plan Note (Signed)
 Encouraged compliance with cpap

## 2023-05-22 NOTE — Assessment & Plan Note (Signed)
 Colonoscopy utd 10/2022

## 2023-05-22 NOTE — Assessment & Plan Note (Signed)
 Reviewed health habits including diet and exercise and skin cancer prevention Reviewed appropriate screening tests for age  Also reviewed health mt list, fam hx and immunization status , as well as social and family history   See HPI Labs reviewed and ordered  Health Maintenance  Topic Date Due   Medicare Annual Wellness Visit  05/18/2023   COVID-19 Vaccine (4 - 2024-25 season) 04/06/2024*   Mammogram  07/11/2023   DTaP/Tdap/Td vaccine (3 - Td or Tdap) 04/21/2025   Colon Cancer Screening  11/17/2027   Pneumonia Vaccine  Completed   Flu Shot  Completed   DEXA scan (bone density measurement)  Completed   Hepatitis C Screening  Completed   Zoster (Shingles) Vaccine  Completed   HPV Vaccine  Aged Out  *Topic was postponed. The date shown is not the original due date.   Flu shot given  Mammogram scheduled in April  Discussed fall prevention, supplements and exercise for bone density  Will plan dexa after dec 2025 Discussed fall prevention, supplements and exercise for bone density  PHQ 1

## 2023-05-22 NOTE — Assessment & Plan Note (Signed)
 Encouraged strongly to start vit D3 4000 international units daily for bone and overall health

## 2023-05-22 NOTE — Patient Instructions (Addendum)
 Get some vitamin D3 over the counter and take 4000 international units daily    Call your neurologist and see if you are supposed to be back on plavix    Stay active Add some strength training to your routine, this is important for bone and brain health and can reduce your risk of falls and help your body use insulin properly and regulate weight  Light weights, exercise bands , and internet videos are a good way to start  Yoga (chair or regular), machines , floor exercises or a gym with machines are also good options     To prevent diabetes Try to get most of your carbohydrates from produce (with the exception of white potatoes) and whole grains Eat less bread/pasta/rice/snack foods/cereals/sweets and other items from the middle of the grocery store (processed carbs)  Make sure you get protein with every meal  The following are examples of protein in diet  Meat  Fish  Eggs  Dairy products  Soy products  Oat milk  Almond milk Nuts and nut butters  Dried beans

## 2023-05-22 NOTE — Progress Notes (Signed)
 Subjective:    Patient ID: Barbara Kramer, female    DOB: 1950/09/05, 73 y.o.   MRN: [id number]  HPI  Here for health maintenance exam and to review chronic medical problems   Wt Readings from Last 3 Encounters:  05/22/23 273 lb 4 oz (123.9 kg)  03/28/23 270 lb (122.5 kg)  03/22/23 268 lb 8 oz (121.8 kg)   51.63 kg/m  Vitals:   05/22/23 1133  BP: 116/68  Pulse: 67  Temp: 98.1 F (36.7 C)  SpO2: 99%    Immunization History  Administered Date(s) Administered   Fluad Quad(high Dose 65+) 03/18/2019, 05/14/2020   Fluad Trivalent(High Dose 65+) 05/22/2023   Influenza,inj,Quad PF,6+ Mos 02/21/2018   PFIZER(Purple Top)SARS-COV-2 Vaccination 06/06/2019, 06/27/2019, 05/21/2020   Pneumococcal Conjugate-13 02/21/2018   Pneumococcal Polysaccharide-23 03/18/2019   Td 08/15/2006   Tdap 04/22/2015   Zoster Recombinant(Shingrix) 01/17/2022, 04/24/2022    Health Maintenance Due  Topic Date Due   Medicare Annual Wellness (AWV)  05/18/2023   Doing ok   Flu shot will get today   Mammogram 06/2022 -is scheduled  Self breast exam-no lumps   Gyn health- some urinary incontinence    Colon cancer screening  colonoscopy 10/2022   Bone health  Dexa 02/2019 normal [place]- none  Fractures Supplements -none  Last vitamin D Lab Results  Component Value Date   VD25OH 10.9 (L) 05/20/2020    Exercise  Some at home with videos  Some water aerobics     Mood    05/22/2023   11:36 AM 05/18/2022    8:16 AM 05/17/2022   11:01 AM 10/18/2021    9:39 AM 05/17/2021   10:34 AM  Depression screen PHQ 2/9  Decreased Interest 0 0 0 0 0  Down, Depressed, Hopeless 0 0 0 0 0  PHQ - 2 Score 0 0 0 0 0  Altered sleeping 0 0 0    Tired, decreased energy 1 0 0    Change in appetite 0 0 0    Feeling bad or failure about yourself  0 0 0    Trouble concentrating 0 0 0    Moving slowly or fidgety/restless 0 0 0    Suicidal thoughts 0 0 0    PHQ-9 Score 1 0 0    Difficult doing work/chores  Very difficult Not difficult at all Not difficult at all     HTN bp is stable today  No cp or palpitations or headaches or edema  No side effects to medicines  BP Readings from Last 3 Encounters:  05/22/23 116/68  03/28/23 114/60  03/22/23 134/64    Chlorthalidone 12.5 mg daily   Lab Results  Component Value Date   NA 140 05/14/2023   K 4.0 05/14/2023   CO2 31 05/14/2023   GLUCOSE 103 (H) 05/14/2023   BUN 17 05/14/2023   CREATININE 0.92 05/14/2023   CALCIUM 10.0 05/14/2023   GFR 63.14 05/14/2023   GFRNONAA >60 04/25/2022     Hyperlipidemia Lab Results  Component Value Date   CHOL 126 05/14/2023   CHOL 111 05/08/2022   CHOL 137 04/25/2022   Lab Results  Component Value Date   HDL 63.40 05/14/2023   HDL 50.60 05/08/2022   HDL 47 04/25/2022   Lab Results  Component Value Date   LDLCALC 54 05/14/2023   LDLCALC 48 05/08/2022   LDLCALC 80 04/25/2022   Lab Results  Component Value Date   TRIG 47.0 05/14/2023   TRIG 64.0 05/08/2022  TRIG 52 04/25/2022   Lab Results  Component Value Date   CHOLHDL 2 05/14/2023   CHOLHDL 2 05/08/2022   CHOLHDL 2.9 04/25/2022   No results found for: "LDLDIRECT" Atorvastatin 20 mg daily  No red meat since last April     Prediabetes Lab Results  Component Value Date   HGBA1C 4.7 (L) 05/14/2023   HGBA1C 4.8 05/08/2022   HGBA1C 4.7 (L) 04/25/2022   Trying to eat healthy  Had some sweets around the holidays   History of TIA  Was on plavix for a while Held it for colonsocopy  She never started it back   Lab Results  Component Value Date   WBC 7.3 05/14/2023   HGB 13.1 05/14/2023   HCT 40.5 05/14/2023   MCV 88.4 05/14/2023   PLT 231.0 05/14/2023       Patient Active Problem List   Diagnosis Date Noted   Vitamin D deficiency 05/22/2023   Right ankle pain 03/22/2023   Strain of thoracic back region 11/23/2022   CAP (community acquired pneumonia) 07/28/2022   Constipation 07/13/2022   Skin hypopigmentation  07/05/2022   Lymph nodes enlarged 07/05/2022   Skin lesions 05/17/2022   Hyperlipidemia 05/03/2022   Intracranial carotid stenosis 04/25/2022   History of TIA (transient ischemic attack) 04/24/2022   Sciatica of left side 01/04/2022   Mobility impaired 08/01/2021   Head pain 06/28/2021   Vulvar cyst 01/25/2021   Rash of neck 08/02/2020   Essential hypertension 06/30/2020   Medicare annual wellness visit, subsequent 05/14/2020   Unilateral primary osteoarthritis, left knee 05/10/2020   Morbid obesity (HCC) 05/10/2020   Family history of colon cancer in mother    Benign neoplasm of colon    OSA (obstructive sleep apnea) 01/29/2018   Estrogen deficiency 01/29/2018   Allergic rhinitis 05/15/2017   Screening mammogram, encounter for 10/11/2016   Diabetes mellitus screening 12/15/2014   Osteoarthritis of left knee 10/16/2013   Colon cancer screening 11/20/2012   Encounter for routine gynecological examination 11/17/2011   Other screening mammogram 11/17/2011   Right shoulder pain 06/05/2011   Low back pain 10/18/2010   Routine general medical examination at a health care facility 10/06/2010   ACANTHOSIS NIGRICANS 01/27/2008   Eczema 08/02/2006   Past Medical History:  Diagnosis Date   Anemia    Arthritis    Breast lump    left   Eczema    Family hx of colon cancer    Gallbladder problem    Hypertension    Kidney cysts    small, right 10/2002   Normal cardiac stress test 11/2008   exercise stress test normal but limited by exercise intolerance   Obesity    Shortness of breath    Sleep apnea    CPAP   TIA (transient ischemic attack)    Past Surgical History:  Procedure Laterality Date   BUNIONECTOMY Right 11/19/1998   CHOLECYSTECTOMY  05/19/2002   [place]- gallstones, small renal cyst 05/2002   COLONOSCOPY WITH PROPOFOL N/A 07/15/2019   Procedure: COLONOSCOPY WITH PROPOFOL;  Surgeon: Benancio Deeds, MD;  Location: [facility];  Service: Gastroenterology;  Laterality:  N/A;   PARTIAL HYSTERECTOMY  03/20/1996   fibroids, ovaries remain   POLYPECTOMY  07/15/2019   Procedure: POLYPECTOMY;  Surgeon: Benancio Deeds, MD;  Location: [facility];  Service: Gastroenterology;;   Social History   Tobacco Use   Smoking status: Former    Average packs/day: 0.3 packs/day for 5.0 years (1.3 ttl pk-yrs)  Types: Cigarettes    Start date: 03/21/1971   Smokeless tobacco: Never  Vaping Use   Vaping status: Never Used  Substance Use Topics   Alcohol use: No    Alcohol/week: 0.0 standard drinks of alcohol   Drug use: No   Family History  Problem Relation Age of Onset   Colon cancer Mother        60's   Stomach cancer Mother    Lung cancer Father        smoker   Hypertension Sister    Dementia Sister    Hypertension Sister    Other Sister        died at [employer]   Other Brother        died at brith   Stroke Maternal Grandmother    Heart attack Maternal Grandfather    Stroke Paternal Grandmother    Colon polyps Daughter    Irritable bowel syndrome Daughter    Hypertension Son    Sleep apnea Son    Esophageal cancer Neg Hx    Rectal cancer Neg Hx    Allergies  Allergen Reactions   Calcium Carbonate Other (See Comments)    constipation   Penicillins Rash    Has patient had a PCN reaction causing immediate rash, facial/tongue/throat swelling, SOB or lightheadedness with hypotension: no Has patient had a PCN reaction causing severe rash involving mucus membranes or skin necrosis: no Has patient had a PCN reaction that required hospitalization: no Has patient had a PCN reaction occurring within the last 10 years: no If all of the above answers are "NO", then may proceed with Cephalosporin use.    Current Outpatient Medications on File Prior to Visit  Medication Sig Dispense Refill   aspirin EC (ASPIRIN 81) 81 MG tablet 81 mg daily.     atorvastatin (LIPITOR) 20 MG tablet TAKE 1 TABLET EVERY DAY 90 tablet 0   chlorthalidone (HYGROTON) 25 MG  tablet TAKE 1/2 TABLET EVERY DAY 45 tablet 0   No current facility-administered medications on file prior to visit.    Review of Systems  Constitutional:  Negative for activity change, appetite change, fatigue, fever and unexpected weight change.  HENT:  Negative for congestion, ear pain, rhinorrhea, sinus pressure and sore throat.   Eyes:  Negative for pain, redness and visual disturbance.  Respiratory:  Negative for cough, shortness of breath and wheezing.   Cardiovascular:  Negative for chest pain and palpitations.  Gastrointestinal:  Negative for abdominal pain, blood in stool, constipation and diarrhea.  Endocrine: Negative for polydipsia and polyuria.  Genitourinary:  Negative for dysuria, frequency and urgency.  Musculoskeletal:  Positive for arthralgias. Negative for back pain and myalgias.  Skin:  Negative for pallor and rash.  Allergic/Immunologic: Negative for environmental allergies.  Neurological:  Negative for dizziness, syncope and headaches.       No more TIA symptoms   Hematological:  Negative for adenopathy. Does not bruise/bleed easily.  Psychiatric/Behavioral:  Negative for decreased concentration and dysphoric mood. The patient is not nervous/anxious.        Objective:   Physical Exam Constitutional:      General: She is not in acute distress.    Appearance: Normal appearance. She is well-developed. She is obese. She is not ill-appearing or diaphoretic.  HENT:     Head: Normocephalic and atraumatic.     Right Ear: Tympanic membrane, ear canal and external ear normal.     Left Ear: Tympanic membrane, ear canal and external ear  normal.     Nose: Nose normal. No congestion.     Mouth/Throat:     Mouth: Mucous membranes are moist.     Pharynx: Oropharynx is clear. No posterior oropharyngeal erythema.  Eyes:     General: No scleral icterus.    Extraocular Movements: Extraocular movements intact.     Conjunctiva/sclera: Conjunctivae normal.     Pupils: Pupils  are equal, round, and reactive to light.  Neck:     Thyroid: No thyromegaly.     Vascular: No carotid bruit or JVD.  Cardiovascular:     Rate and Rhythm: Normal rate and regular rhythm.     Pulses: Normal pulses.     Heart sounds: Normal heart sounds.     No gallop.  Pulmonary:     Effort: Pulmonary effort is normal. No respiratory distress.     Breath sounds: Normal breath sounds. No wheezing.     Comments: Good air exch Chest:     Chest wall: No tenderness.  Abdominal:     General: Bowel sounds are normal. There is no distension or abdominal bruit.     Palpations: Abdomen is soft. There is no mass.     Tenderness: There is no abdominal tenderness.     Hernia: No hernia is present.  Genitourinary:    Comments: Breast exam: No mass, nodules, thickening, tenderness, bulging, retraction, inflamation, nipple discharge or skin changes noted.  No axillary or clavicular LA.     Musculoskeletal:        General: No tenderness. Normal range of motion.     Cervical back: Normal range of motion and neck supple. No rigidity. No muscular tenderness.     Right lower leg: No edema.     Left lower leg: No edema.     Comments: No kyphosis   Lymphadenopathy:     Cervical: No cervical adenopathy.  Skin:    General: Skin is warm and dry.     Coloration: Skin is not pale.     Findings: No erythema or rash.     Comments: Some skin tags  Hyperpigmentation on back of neck   Neurological:     Mental Status: She is alert. Mental status is at baseline.     Cranial Nerves: No cranial nerve deficit.     Motor: No abnormal muscle tone.     Coordination: Coordination normal.     Gait: Gait normal.     Deep Tendon Reflexes: Reflexes are normal and symmetric. Reflexes normal.  Psychiatric:        Mood and Affect: Mood normal.        Cognition and Memory: Cognition and memory normal.           Assessment & Plan:   Problem List Items Addressed This Visit       Cardiovascular and Mediastinum    Intracranial carotid stenosis   With history of TIA Pt unclear if she was supposed to start plavix back  Plans to call neuro today  On asa 81 mg daily  No new neuro changes       Essential hypertension   bp in fair control at this time  BP Readings from Last 1 Encounters:  05/22/23 116/68   No changes needed Most recent labs reviewed  Disc lifstyle change with low sodium diet and exercise  Continues chlorthalidone 12.5 mg daily and tolerates it well         Respiratory   OSA (obstructive sleep apnea)   Encouraged  compliance with cpap        Other   Vitamin D deficiency   Encouraged strongly to start vit D3 4000 international units daily for bone and overall health       Screening mammogram, encounter for   Mammogram planned for April       Routine general medical examination at a health care facility - Primary   Reviewed health habits including diet and exercise and skin cancer prevention Reviewed appropriate screening tests for age  Also reviewed health mt list, fam hx and immunization status , as well as social and family history   See HPI Labs reviewed and ordered  Health Maintenance  Topic Date Due   Medicare Annual Wellness Visit  05/18/2023   COVID-19 Vaccine (4 - 2024-25 season) 04/06/2024*   Mammogram  07/11/2023   DTaP/Tdap/Td vaccine (3 - Td or Tdap) 04/21/2025   Colon Cancer Screening  11/17/2027   Pneumonia Vaccine  Completed   Flu Shot  Completed   DEXA scan (bone density measurement)  Completed   Hepatitis C Screening  Completed   Zoster (Shingles) Vaccine  Completed   HPV Vaccine  Aged Out  *Topic was postponed. The date shown is not the original due date.   Flu shot given  Mammogram scheduled in April  Discussed fall prevention, supplements and exercise for bone density  Will plan dexa after dec 2025 Discussed fall prevention, supplements and exercise for bone density  PHQ 1       Morbid obesity (HCC)   Discussed how this problem  influences overall health and the risks it imposes  Reviewed plan for weight loss with lower calorie diet (via better food choices (lower glycemic and portion control) along with exercise building up to or more than 30 minutes 5 days per week including some aerobic activity and strength training         Hyperlipidemia   Disc goals for lipids and reasons to control them Rev last labs with pt Rev low sat fat diet in detail  Improved HDL  Plan ot continue atorvastatin 20 mg daily         Estrogen deficiency   Dexa will be due 02/2024 for 5 y check  Last one normal      Diabetes mellitus screening   Lab Results  Component Value Date   HGBA1C 4.7 (L) 05/14/2023   HGBA1C 4.8 05/08/2022   HGBA1C 4.7 (L) 04/25/2022   Good control with diet  disc imp of low glycemic diet and wt loss to prevent DM2       Colon cancer screening   Colonoscopy utd 10/2022      Other Visit Diagnoses       Need for influenza vaccination       Relevant Orders   Flu Vaccine Trivalent High Dose (Fluad) (Completed)

## 2023-05-24 ENCOUNTER — Other Ambulatory Visit: Payer: Self-pay

## 2023-05-24 ENCOUNTER — Emergency Department (HOSPITAL_COMMUNITY)
Admission: EM | Admit: 2023-05-24 | Discharge: 2023-05-24 | Disposition: A | Discharge status: Home / Self Care | Attending: Emergency Medicine | Admitting: Emergency Medicine

## 2023-05-24 DIAGNOSIS — S59911A Unspecified injury of right forearm, initial encounter: Secondary | ICD-10-CM | POA: Diagnosis present

## 2023-05-24 DIAGNOSIS — I4891 Unspecified atrial fibrillation: Secondary | ICD-10-CM | POA: Diagnosis not present

## 2023-05-24 DIAGNOSIS — Z7901 Long term (current) use of anticoagulants: Secondary | ICD-10-CM | POA: Diagnosis not present

## 2023-05-24 DIAGNOSIS — X58XXXA Exposure to other specified factors, initial encounter: Secondary | ICD-10-CM | POA: Diagnosis not present

## 2023-05-24 DIAGNOSIS — S5011XA Contusion of right forearm, initial encounter: Secondary | ICD-10-CM | POA: Insufficient documentation

## 2023-05-24 DIAGNOSIS — T148XXA Other injury of unspecified body region, initial encounter: Secondary | ICD-10-CM

## 2023-05-24 DIAGNOSIS — S40021A Contusion of right upper arm, initial encounter: Secondary | ICD-10-CM | POA: Diagnosis not present
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
## 2023-05-25 ENCOUNTER — Ambulatory Visit: Payer: Medicare HMO

## 2023-05-25 VITALS — Ht 62.0 in | Wt 273.0 lb

## 2023-05-25 DIAGNOSIS — Z0000 Encounter for general adult medical examination without abnormal findings: Secondary | ICD-10-CM

## 2023-05-25 NOTE — Progress Notes (Signed)
 Subjective:   Barbara Kramer is a 73 y.o. who presents for a Medicare Wellness preventive visit.  Visit Complete: Virtual I connected with  Barbara Kramer on 05/25/23 by a audio enabled telemedicine application and verified that I am speaking with the correct person using two identifiers.  Patient Location: Home  Provider Location: Home Office  I discussed the limitations of evaluation and management by telemedicine. The patient expressed understanding and agreed to proceed.  Vital Signs: Because this visit was a virtual/telehealth visit, some criteria may be missing or patient reported. Any vitals not documented were not able to be obtained and vitals that have been documented are patient reported.  VideoDeclined- This patient declined Librarian, academic. Therefore the visit was completed with audio only.  AWV Questionnaire: No: Patient Medicare AWV questionnaire was not completed prior to this visit.  Cardiac Risk Factors include: advanced age (>67men, >2 women);dyslipidemia;hypertension;obesity (BMI >30kg/m2);sedentary lifestyle     Objective:    Today's Vitals   05/25/23 1307 05/25/23 1308  Weight: 273 lb (123.8 kg)   Height: 5\' 2"  (1.575 m)   PainSc:  0-No pain   Body mass index is 49.93 kg/m.     05/25/2023    1:16 PM 05/24/2023   10:13 AM 07/10/2022    9:00 PM 05/18/2022    8:17 AM 04/25/2022    4:09 AM 07/15/2019    7:51 AM 03/11/2019    9:46 AM  Advanced Directives  Does Patient Have a Medical Advance Directive? No No No No No No No  Does patient want to make changes to medical advance directive?       Yes (MAU/Ambulatory/Procedural Areas - Information given)  Would patient like information on creating a medical advance directive?   No - Patient declined No - Patient declined No - Patient declined No - Patient declined     Current Medications (verified) Outpatient Encounter Medications as of 05/25/2023  Medication Sig   aspirin  EC (ASPIRIN 81) 81 MG tablet 81 mg daily.   atorvastatin (LIPITOR) 20 MG tablet TAKE 1 TABLET EVERY DAY   chlorthalidone (HYGROTON) 25 MG tablet TAKE 1/2 TABLET EVERY DAY   No facility-administered encounter medications on file as of 05/25/2023.    Allergies (verified) Calcium carbonate and Penicillins   History: Past Medical History:  Diagnosis Date   Anemia    Arthritis    Breast lump    left   Eczema    Family hx of colon cancer    Gallbladder problem    Hypertension    Kidney cysts    small, right 10/2002   Normal cardiac stress test 11/2008   exercise stress test normal but limited by exercise intolerance   Obesity    Shortness of breath    Sleep apnea    CPAP   TIA (transient ischemic attack)    Past Surgical History:  Procedure Laterality Date   BUNIONECTOMY Right 11/19/1998   CHOLECYSTECTOMY  05/19/2002   [place]- gallstones, small renal cyst 05/2002   COLONOSCOPY WITH PROPOFOL N/A 07/15/2019   Procedure: COLONOSCOPY WITH PROPOFOL;  Surgeon: Benancio Deeds, MD;  Location: [facility];  Service: Gastroenterology;  Laterality: N/A;   PARTIAL HYSTERECTOMY  03/20/1996   fibroids, ovaries remain   POLYPECTOMY  07/15/2019   Procedure: POLYPECTOMY;  Surgeon: Benancio Deeds, MD;  Location: [facility];  Service: Gastroenterology;;   Family History  Problem Relation Age of Onset   Colon cancer Mother  40's   Stomach cancer Mother    Lung cancer Father        smoker   Hypertension Sister    Dementia Sister    Hypertension Sister    Other Sister        died at [employer]   Other Brother        died at brith   Stroke Maternal Grandmother    Heart attack Maternal Grandfather    Stroke Paternal Grandmother    Colon polyps Daughter    Irritable bowel syndrome Daughter    Hypertension Son    Sleep apnea Son    Esophageal cancer Neg Hx    Rectal cancer Neg Hx    Social History   Socioeconomic History   Marital status: Single    Spouse name: Not on  file   Number of children: 2   Years of education: Not on file   Highest education level: Associate degree: academic program  Occupational History   Occupation: Retired    Associate Professor: [employer]  Tobacco Use   Smoking status: Former    Average packs/day: 0.3 packs/day for 5.0 years (1.3 ttl pk-yrs)    Types: Cigarettes    Start date: 03/21/1971   Smokeless tobacco: Never  Vaping Use   Vaping status: Never Used  Substance and Sexual Activity   Alcohol use: No    Alcohol/week: 0.0 standard drinks of alcohol   Drug use: No   Sexual activity: Not Currently  Other Topics Concern   Not on file  Social History Narrative   Not on file   Social Drivers of Health   Financial Resource Strain: Low Risk  (05/25/2023)   Overall Financial Resource Strain (CARDIA)    Difficulty of Paying Living Expenses: Not hard at all  Food Insecurity: No Food Insecurity (03/21/2023)   Hunger Vital Sign    Worried About Running Out of Food in the Last Year: Never true    Ran Out of Food in the Last Year: Never true  Transportation Needs: No Transportation Needs (05/25/2023)   PRAPARE - Administrator, Civil Service (Medical): No    Lack of Transportation (Non-Medical): No  Physical Activity: Insufficiently Active (05/25/2023)   Exercise Vital Sign    Days of Exercise per Week: 1 day    Minutes of Exercise per Session: 30 min  Stress: No Stress Concern Present (05/25/2023)   [employer] of Occupational Health - Occupational Stress Questionnaire    Feeling of Stress : Not at all  Social Connections: Moderately Integrated (05/25/2023)   Social Connection and Isolation Panel [NHANES]    Frequency of Communication with Friends and Family: More than three times a week    Frequency of Social Gatherings with Friends and Family: More than three times a week    Attends Religious Services: More than 4 times per year    Active Member of [employer] or Organizations: Yes    Attends Hospital doctor: More than 4 times per year    Marital Status: Divorced    Tobacco Counseling Counseling given: Not Answered    Clinical Intake:  Pre-visit preparation completed: Yes  Pain : No/denies pain Pain Score: 0-No pain     BMI - recorded: 49.93  How often do you need to have someone help you when you read instructions, pamphlets, or other written materials from your doctor or pharmacy?: 1 - Never  Interpreter Needed?: No  Comments: son lives with pt Information entered by ::  B.Kolbi Tofte,LPN   Activities of Daily Living     05/25/2023    1:17 PM  In your present state of health, do you have any difficulty performing the following activities:  Hearing? 0  Vision? 1  Difficulty concentrating or making decisions? 0  Walking or climbing stairs? 1  Dressing or bathing? 0  Doing errands, shopping? 0  Preparing Food and eating ? N  Using the Toilet? N  In the past six months, have you accidently leaked urine? N  Do you have problems with loss of bowel control? N  Managing your Medications? N  Managing your Finances? N  Housekeeping or managing your Housekeeping? N    Patient Care Team: Tower, Audrie Gallus, MD as PCP - General (Family Medicine) Debbe Odea, MD as PCP - Cardiology (Cardiology)  Indicate any recent Medical Services you may have received from other than [facility] providers in the past year (date may be approximate).     Assessment:   This is a routine wellness examination for Maisie.  Hearing/Vision screen Hearing Screening - Comments:: Pt says her hearing is fine Vision Screening - Comments:: Pt says her vision is worsening but ok for now Dr Dion Body   Goals Addressed             This Visit's Progress    Patient Stated   On track    05/25/23-Lose 150lb.       Depression Screen     05/25/2023    1:14 PM 05/22/2023   11:36 AM 05/18/2022    8:16 AM 05/17/2022   11:01 AM 10/18/2021    9:39 AM 05/17/2021   10:34 AM 05/20/2020    8:14 AM  PHQ  2/9 Scores  PHQ - 2 Score 0 0 0 0 0 0 1  PHQ- 9 Score  1 0 0   6    Fall Risk     05/25/2023    1:11 PM 05/22/2023   11:36 AM 04/11/2023    8:23 AM 05/18/2022    8:19 AM 05/17/2022   11:01 AM  Fall Risk   Falls in the past year? 0 0 0 0 0  Number falls in past yr: 0 0  0 0  Injury with Fall? 0 0  0 0  Risk for fall due to : No Fall Risks No Fall Risks  No Fall Risks No Fall Risks  Follow up Education provided;Falls prevention discussed Falls evaluation completed  Falls evaluation completed;Falls prevention discussed Falls evaluation completed    MEDICARE RISK AT HOME:  Medicare Risk at Home Any stairs in or around the home?: Yes If so, are there any without handrails?: Yes Home free of loose throw rugs in walkways, pet beds, electrical cords, etc?: Yes Adequate lighting in your home to reduce risk of falls?: Yes Life alert?: No Use of a cane, walker or w/c?: Yes (rollater for outside) Grab bars in the bathroom?: Yes Shower chair or bench in shower?: Yes Elevated toilet seat or a handicapped toilet?: Yes  TIMED UP AND GO:  Was the test performed?  No  Cognitive Function: 6CIT completed    03/11/2019    9:50 AM  MMSE - Mini Mental State Exam  Orientation to time 5  Orientation to Place 5  Registration 3  Attention/ Calculation 5  Recall 3  Language- repeat 1        05/25/2023    1:19 PM 05/18/2022    8:21 AM  6CIT Screen  What Year? 0 points  0 points  What month? 0 points 0 points  What time? 0 points 0 points  Count back from 20 0 points 0 points  Months in reverse 0 points 0 points  Repeat phrase 0 points 0 points  Total Score 0 points 0 points    Immunizations Immunization History  Administered Date(s) Administered   Fluad Quad(high Dose 65+) 03/18/2019, 05/14/2020   Fluad Trivalent(High Dose 65+) 05/22/2023   Influenza,inj,Quad PF,6+ Mos 02/21/2018   PFIZER(Purple Top)SARS-COV-2 Vaccination 06/06/2019, 06/27/2019, 05/21/2020   Pneumococcal Conjugate-13  02/21/2018   Pneumococcal Polysaccharide-23 03/18/2019   Td 08/15/2006   Tdap 04/22/2015   Zoster Recombinant(Shingrix) 01/17/2022, 04/24/2022    Screening Tests Health Maintenance  Topic Date Due   COVID-19 Vaccine (4 - 2024-25 season) 04/06/2024 (Originally 11/19/2022)   MAMMOGRAM  07/11/2023   Medicare Annual Wellness (AWV)  05/24/2024   DTaP/Tdap/Td (3 - Td or Tdap) 04/21/2025   Colonoscopy  11/17/2027   Pneumonia Vaccine 2+ Years old  Completed   INFLUENZA VACCINE  Completed   DEXA SCAN  Completed   Hepatitis C Screening  Completed   Zoster Vaccines- Shingrix  Completed   HPV VACCINES  Aged Out    Health Maintenance  There are no preventive care reminders to display for this patient.  Health Maintenance Items Addressed: Pt says she has MMG scheduled already.   Additional Screening:  Vision Screening: Recommended annual ophthalmology exams for early detection of glaucoma and other disorders of the eye.  Dental Screening: Recommended annual dental exams for proper oral hygiene  Community Resource Referral / Chronic Care Management: CRR required this visit?  No   CCM required this visit?  No    Plan:     I have personally reviewed and noted the following in the patient's chart:   Medical and social history Use of alcohol, tobacco or illicit drugs  Current medications and supplements including opioid prescriptions. Patient is not currently taking opioid prescriptions. Functional ability and status Nutritional status Physical activity Advanced directives List of other physicians Hospitalizations, surgeries, and ER visits in previous 12 months Vitals Screenings to include cognitive, depression, and falls Referrals and appointments  In addition, I have reviewed and discussed with patient certain preventive protocols, quality metrics, and best practice recommendations. A written personalized care plan for preventive services as well as general preventive health  recommendations were provided to patient.    Sue Lush, LPN   08/20/1306   After Visit Summary: (MyChart) Due to this being a telephonic visit, the after visit summary with patients personalized plan was offered to patient via MyChart   Notes: Nothing significant to report at this time.

## 2023-05-25 NOTE — Patient Instructions (Signed)
 Ms. Flatley , Thank you for taking time to come for your Medicare Wellness Visit. I appreciate your ongoing commitment to your health goals. Please review the following plan we discussed and let me know if I can assist you in the future.   Referrals/Orders/Follow-Ups/Clinician Recommendations: none  This is a list of the screening recommended for you and due dates:  Health Maintenance  Topic Date Due   COVID-19 Vaccine (4 - 2024-25 season) 04/06/2024*   Mammogram  07/11/2023   Medicare Annual Wellness Visit  05/24/2024   DTaP/Tdap/Td vaccine (3 - Td or Tdap) 04/21/2025   Colon Cancer Screening  11/17/2027   Pneumonia Vaccine  Completed   Flu Shot  Completed   DEXA scan (bone density measurement)  Completed   Hepatitis C Screening  Completed   Zoster (Shingles) Vaccine  Completed   HPV Vaccine  Aged Out  *Topic was postponed. The date shown is not the original due date.    Advanced directives: (Declined) Advance directive discussed with you today. Even though you declined this today, please call our office should you change your mind, and we can give you the proper paperwork for you to fill out.  Next Medicare Annual Wellness Visit scheduled for next year: Yes 05/27/24 @ 1pm televisit

## 2023-05-28 ENCOUNTER — Telehealth: Payer: Self-pay | Admitting: Diagnostic Neuroimaging

## 2023-05-28 ENCOUNTER — Encounter: Payer: Self-pay | Admitting: Neurology

## 2023-05-28 ENCOUNTER — Encounter: Payer: Self-pay | Admitting: Family Medicine

## 2023-05-28 ENCOUNTER — Ambulatory Visit (INDEPENDENT_AMBULATORY_CARE_PROVIDER_SITE_OTHER): Admitting: Family Medicine

## 2023-05-28 VITALS — BP 132/74 | HR 68 | Temp 98.3°F | Ht 62.0 in | Wt 276.1 lb

## 2023-05-28 DIAGNOSIS — T148XXA Other injury of unspecified body region, initial encounter: Secondary | ICD-10-CM | POA: Insufficient documentation

## 2023-05-28 DIAGNOSIS — I6529 Occlusion and stenosis of unspecified carotid artery: Secondary | ICD-10-CM | POA: Diagnosis not present

## 2023-05-28 NOTE — Progress Notes (Signed)
 Subjective:    Patient ID: Barbara Kramer, female    DOB: [birth year], 73 y.o.   MRN: [id number]  HPI  Wt Readings from Last 3 Encounters:  05/28/23 276 lb 2 oz (125.2 kg)  05/25/23 273 lb (123.8 kg)  05/22/23 273 lb 4 oz (123.9 kg)   50.50 kg/m  Vitals:   05/28/23 1201  BP: 132/74  Pulse: 68  Temp: 98.3 F (36.8 C)  SpO2: 94%     Pt presents for follow up of ER visit on 3/6 for hematoma on right arm   She takes asa 81 mg  in setting of TIA Was prevention on plavix /off of it now   No trauma    Per ER records  Patient's history of present on this and physical exam findings are most consistent with superficial injury.  1 cm circumscribed bruise.  I performed a point-of-care ultrasound.  She has compressible brachial axillary and antecubital veins without any evidence of obstruction.  Doppler flow normal through arterial systems. She has a localized circumscribed 1 mL hematoma on her right forearm with surrounding bruising. No pulsatile or color-flow inside of this lesion. Discussed supportive care management with the patient who expressed understanding. No acute indication for further intervention at this time.  Patient ambulatory tolerating p.o. intake and feels comfortable with outpatient care management.    Feels the same Used cold pack several times  No pain  Just a little tender to the touch      Patient Active Problem List   Diagnosis Date Noted   Hematoma 05/28/2023   Vitamin D deficiency 05/22/2023   Right ankle pain 03/22/2023   Strain of thoracic back region 11/23/2022   CAP (community acquired pneumonia) 07/28/2022   Constipation 07/13/2022   Skin hypopigmentation 07/05/2022   Lymph nodes enlarged 07/05/2022   Skin lesions 05/17/2022   Hyperlipidemia 05/03/2022   Intracranial carotid stenosis 04/25/2022   History of TIA (transient ischemic attack) 04/24/2022   Sciatica of left side 01/04/2022   Mobility impaired 08/01/2021   Head pain  06/28/2021   Vulvar cyst 01/25/2021   Rash of neck 08/02/2020   Essential hypertension 06/30/2020   Medicare annual wellness visit, subsequent 05/14/2020   Unilateral primary osteoarthritis, left knee 05/10/2020   Morbid obesity (HCC) 05/10/2020   Family history of colon cancer in mother    Benign neoplasm of colon    OSA (obstructive sleep apnea) 01/29/2018   Estrogen deficiency 01/29/2018   Allergic rhinitis 05/15/2017   Screening mammogram, encounter for 10/11/2016   Diabetes mellitus screening 12/15/2014   Osteoarthritis of left knee 10/16/2013   Colon cancer screening 11/20/2012   Encounter for routine gynecological examination 11/17/2011   Other screening mammogram 11/17/2011   Right shoulder pain 06/05/2011   Low back pain 10/18/2010   Routine general medical examination at a health care facility 10/06/2010   ACANTHOSIS NIGRICANS 01/27/2008   Eczema 08/02/2006   Past Medical History:  Diagnosis Date   Anemia    Arthritis    Breast lump    left   Eczema    Family hx of colon cancer    Gallbladder problem    Hypertension    Kidney cysts    small, right 10/2002   Normal cardiac stress test 11/2008   exercise stress test normal but limited by exercise intolerance   Obesity    Shortness of breath    Sleep apnea    CPAP   TIA (transient ischemic attack)  Past Surgical History:  Procedure Laterality Date   BUNIONECTOMY Right 11/19/1998   CHOLECYSTECTOMY  05/19/2002   [place]- gallstones, small renal cyst 05/2002   COLONOSCOPY WITH PROPOFOL N/A 07/15/2019   Procedure: COLONOSCOPY WITH PROPOFOL;  Surgeon: Benancio Deeds, MD;  Location: [facility];  Service: Gastroenterology;  Laterality: N/A;   PARTIAL HYSTERECTOMY  03/20/1996   fibroids, ovaries remain   POLYPECTOMY  07/15/2019   Procedure: POLYPECTOMY;  Surgeon: Benancio Deeds, MD;  Location: [facility];  Service: Gastroenterology;;   Social History   Tobacco Use   Smoking status: Former     Average packs/day: 0.3 packs/day for 5.0 years (1.3 ttl pk-yrs)    Types: Cigarettes    Start date: 03/21/1971   Smokeless tobacco: Never  Vaping Use   Vaping status: Never Used  Substance Use Topics   Alcohol use: No    Alcohol/week: 0.0 standard drinks of alcohol   Drug use: No   Family History  Problem Relation Age of Onset   Colon cancer Mother        93's   Stomach cancer Mother    Lung cancer Father        smoker   Hypertension Sister    Dementia Sister    Hypertension Sister    Other Sister        died at brith   Other Brother        died at brith   Stroke Maternal Grandmother    Heart attack Maternal Grandfather    Stroke Paternal Grandmother    Colon polyps Daughter    Irritable bowel syndrome Daughter    Hypertension Son    Sleep apnea Son    Esophageal cancer Neg Hx    Rectal cancer Neg Hx    Allergies  Allergen Reactions   Calcium Carbonate Other (See Comments)    constipation   Penicillins Rash    Has patient had a PCN reaction causing immediate rash, facial/tongue/throat swelling, SOB or lightheadedness with hypotension: no Has patient had a PCN reaction causing severe rash involving mucus membranes or skin necrosis: no Has patient had a PCN reaction that required hospitalization: no Has patient had a PCN reaction occurring within the last 10 years: no If all of the above answers are "NO", then may proceed with Cephalosporin use.    Current Outpatient Medications on File Prior to Visit  Medication Sig Dispense Refill   aspirin EC (ASPIRIN 81) 81 MG tablet 81 mg daily.     atorvastatin (LIPITOR) 20 MG tablet TAKE 1 TABLET EVERY DAY 90 tablet 0   chlorthalidone (HYGROTON) 25 MG tablet TAKE 1/2 TABLET EVERY DAY 45 tablet 0   No current facility-administered medications on file prior to visit.    Review of Systems  Constitutional:  Negative for fatigue.  Cardiovascular:  Negative for chest pain, palpitations and leg swelling.  Musculoskeletal:         Small lump on forearm Had a bruise , that is resolving   Skin:  Negative for rash and wound.       Objective:   Physical Exam Constitutional:      Appearance: Normal appearance. She is obese.  Eyes:     General:        Right eye: No discharge.        Left eye: No discharge.     Conjunctiva/sclera: Conjunctivae normal.     Pupils: Pupils are equal, round, and reactive to light.  Cardiovascular:  Rate and Rhythm: Normal rate and regular rhythm.     Pulses: Normal pulses.  Musculoskeletal:     Cervical back: No tenderness.     Comments: Right forearm/anterior  0.5 cm round knot felt under skin with scant old bruising noted ,smaller area noted just superior to  Very slight tenderness  Area seems well perfused  No skin interruption   Normal rom of hand   Skin:    General: Skin is warm and dry.     Findings: No bruising.  Neurological:     Mental Status: She is alert.     Sensory: No sensory deficit.     Motor: No weakness.  Psychiatric:        Mood and Affect: Mood normal.           Assessment & Plan:   Problem List Items Addressed This Visit       Cardiovascular and Mediastinum   Intracranial carotid stenosis   Now on asa 81 mg  Done with plavix No symptoms         Other   Hematoma - Primary   Right forearm /anterior  Was seen at ED on 3/6 and had reassuring ultrasound to r/o clot  Reviewed hospital records, lab results and studies in detail   Takes asa 81 mg daily  Does not remember trauma  Today exam is very reassuring , minimal ecchymosis and small knot felt in soft tissue Encouraged to protect area from trauma  Cold compress if needed for discomfort   Call if increase in size , pain / color change or other concerns  Call back and Er precautions noted in detail today

## 2023-05-28 NOTE — Assessment & Plan Note (Signed)
 Right forearm /anterior  Was seen at ED on 3/6 and had reassuring ultrasound to r/o clot  Reviewed hospital records, lab results and studies in detail   Takes asa 81 mg daily  Does not remember trauma  Today exam is very reassuring , minimal ecchymosis and small knot felt in soft tissue Encouraged to protect area from trauma  Cold compress if needed for discomfort   Call if increase in size , pain / color change or other concerns  Call back and Er precautions noted in detail today

## 2023-05-28 NOTE — Telephone Encounter (Signed)
 Called the patient and advised that Dr Marjory Lies would recommend she take plavix daily and stop asa. Pt states that she will restart this. Advised that patient can continue to get refills going forward through PCP since there is no follow up necessary at this time through neurology. Advised I would send an update to her PCP as well.  She verbalized understanding.

## 2023-05-28 NOTE — Assessment & Plan Note (Signed)
 Now on asa 81 mg  Done with plavix No symptoms

## 2023-05-28 NOTE — Patient Instructions (Signed)
 Hematoma looks ok  I think it is getting smaller  Continue to watch it  Protect from trauma   Continue the aspirin  Cold pack is fine for discomfort   If more pain or tenderness or increase in size or other concerns, give [place] a call

## 2023-05-28 NOTE — Telephone Encounter (Signed)
 Pt states her PCP has suggested she check with her Neurologist to see what Dr Marjory Lies says about her continuing to take Plavix, please call.

## 2023-05-28 NOTE — Telephone Encounter (Signed)
 Pt called in because at her last ov she remembered asking Dr Marjory Lies about remaining on plavix and he had done so at that time. She had stopped the plavix about 5 days prior to colonoscopy procedure and she just never restarted. Since Aug she has remained just on asa 81 mg daily and had stopped the plavix. At PCP visit recently they wanted her to contact and confirm if Dr Marjory Lies agrees with staying off plavix.  I reviewed the notes with Dr Marjory Lies from the last visit on file which was April 2024 following a TIA episode and he had recommended for her to continue the dual therapy of asa and plavix for 3 months and then dc asa and take plavix alone. He had also recommended CTA head and neck to be completed.   Pt stated that she had the CTA's scheduled and had to cancel because of the cost of the testing was going to be more than she could afford.  Pt apologized about stopping the plavix. She had it mixed up thinking that she was ok to have stopped the plavix and stayed on asa.   Advised I would bring this to MD attention and get his thoughts.

## 2023-05-28 NOTE — Telephone Encounter (Signed)
 Please make pt aware  I pended plavix to send to pharm of choice

## 2023-05-29 MED ORDER — CLOPIDOGREL BISULFATE 75 MG PO TABS: 75.0000 mg | ORAL_TABLET | Freq: Every day | ORAL | 3 refills | Status: AC

## 2023-05-29 NOTE — Telephone Encounter (Signed)
Pt notified of Dr. Tower's comments and Rx sent to pharmacy  

## 2023-05-31 ENCOUNTER — Ambulatory Visit: Payer: Medicare HMO | Admitting: Dermatology

## 2023-06-21 ENCOUNTER — Encounter: Payer: Self-pay | Admitting: Diagnostic Neuroimaging

## 2023-06-26 ENCOUNTER — Ambulatory Visit
Admission: RE | Admit: 2023-06-26 | Discharge: 2023-06-26 | Disposition: A | Discharge status: Home / Self Care | Source: Ambulatory Visit | Attending: Diagnostic Neuroimaging | Admitting: Diagnostic Neuroimaging

## 2023-06-26 ENCOUNTER — Encounter: Payer: Self-pay | Admitting: Family Medicine

## 2023-06-26 DIAGNOSIS — I63411 Cerebral infarction due to embolism of right middle cerebral artery: Secondary | ICD-10-CM

## 2023-06-26 DIAGNOSIS — G459 Transient cerebral ischemic attack, unspecified: Secondary | ICD-10-CM

## 2023-06-26 DIAGNOSIS — I6623 Occlusion and stenosis of bilateral posterior cerebral arteries: Secondary | ICD-10-CM | POA: Diagnosis not present

## 2023-06-26 DIAGNOSIS — I651 Occlusion and stenosis of basilar artery: Secondary | ICD-10-CM | POA: Diagnosis not present

## 2023-06-26 DIAGNOSIS — I6603 Occlusion and stenosis of bilateral middle cerebral arteries: Secondary | ICD-10-CM | POA: Diagnosis not present

## 2023-06-26 DIAGNOSIS — G4733 Obstructive sleep apnea (adult) (pediatric): Secondary | ICD-10-CM

## 2023-06-26 DIAGNOSIS — I6521 Occlusion and stenosis of right carotid artery: Secondary | ICD-10-CM | POA: Diagnosis not present

## 2023-06-26 MED ORDER — IOPAMIDOL (ISOVUE-300) INJECTION 61%
75.0000 mL | Freq: Once | INTRAVENOUS | Status: AC | PRN
  Administered 2023-06-26: 75 mL via INTRAVENOUS

## 2023-06-27 ENCOUNTER — Telehealth: Payer: Self-pay | Admitting: Neurology

## 2023-06-27 NOTE — Telephone Encounter (Signed)
 Called the patient and advised that Dr Marjory Lies wanted to schedule VV

## 2023-06-27 NOTE — Telephone Encounter (Signed)
 Received a call report for the patient who completed CTA head and neck that was ordered a year ago. Report is in the system and I will send this to Dr Marjory Lies for him to review the results and determine if anything else is needed.

## 2023-06-29 ENCOUNTER — Other Ambulatory Visit: Payer: Self-pay | Admitting: Diagnostic Neuroimaging

## 2023-06-29 ENCOUNTER — Encounter: Payer: Self-pay | Admitting: Diagnostic Neuroimaging

## 2023-06-29 DIAGNOSIS — I776 Arteritis, unspecified: Secondary | ICD-10-CM

## 2023-07-11 ENCOUNTER — Encounter: Payer: Self-pay | Admitting: Diagnostic Neuroimaging

## 2023-07-11 ENCOUNTER — Telehealth (INDEPENDENT_AMBULATORY_CARE_PROVIDER_SITE_OTHER): Admitting: Diagnostic Neuroimaging

## 2023-07-11 DIAGNOSIS — I672 Cerebral atherosclerosis: Secondary | ICD-10-CM | POA: Diagnosis not present

## 2023-07-11 DIAGNOSIS — I63411 Cerebral infarction due to embolism of right middle cerebral artery: Secondary | ICD-10-CM

## 2023-07-11 NOTE — Progress Notes (Signed)
 [facility]  PATIENT: Barbara Kramer DOB: 02/09/1951  REFERRING CLINICIAN: Tower, Manley Seeds, MD HISTORY FROM: patient  REASON FOR VISIT: follow up   HISTORICAL  CHIEF COMPLAINT:  Chief Complaint  Patient presents with   follow up CTA results    HISTORY OF PRESENT ILLNESS:   UPDATE (07/11/23, VRP): Since last visit, doing well.  Here for follow-up of CTA results.  Progressive atherosclerosis noted, with widespread calcification, more likely related to cerebral atherosclerotic disease rather than vasculitis.  Discussed option to pursue vasculitis testing with cerebral angiogram and other testing, but patient would like to hold off on these more aggressive measures.  UPDATE (06/28/22, VRP): 73 year old female with here for hospital stroke follow-up.  Patient doing well.  She is tolerating her medications.  Symptoms have resolved until 06/22/22 when she had recurrence of left facial numbness and slurred speech for 10 minutes.  She was in [place]  at the time and had a evaluation with CT scan which was unremarkable.  She had completed her aspirin  and Plavix  dual therapy for 21 days after initial stroke, and this was resumed after this recurrent TIA 1 week ago.  This recurrent TIA 1 week ago.   PRIOR HPI (Dr. Dahlia Dross, 04/26/22): "Stroke:  Punctate strokes in the right MCA territory Etiology:  significant intracranial atherosclerotic disease Code Stroke CT head No acute abnormality.  CTA head & neck Severe near occlusive stenosis at the right ICA terminus/proximal right M1 segment. Atheromatous change within the carotid siphons with associated moderate to severe stenoses about the para clinoid segments, right worse than left. Short-segment moderate proximal basilar stenosis, with additional focal severe left P2 stenosis. Diffuse tortuosity of the major arterial vasculature of the neck, suggesting chronic underlying hypertension. MRI  Positive for Acute Right MCA territory  ischemia. Several small cortical and/or subcortical infarcts involving the lateral posterior right temporal lobe cortex, superior perirolandic area 2D Echo EF 60-65% LDL 80 HgbA1c 4.7 VTE prophylaxis - lovenox        Diet    Diet Heart Room service appropriate? Yes; Fluid consistency: Thin        No antithrombotic prior to admission, now on aspirin  81 mg daily and clopidogrel  75 mg daily.  Therapy recommendations: No follow up Disposition:  pending   Hypertension Home meds:  chlorthalidone - does not take consistently Stable Permissive hypertension (OK if < 220/120) but gradually normalize in 5-7 days Long-term BP goal normotensive Agreeable to log blood pressures for PCP   Hyperlipidemia LDL 80, goal < 70 Add Atorvastatin  20mg   Continue statin at discharge   Other Stroke Risk Factors Advanced Age >/= 40  Obesity, Body mass index is 51.81 kg/m., BMI >/= 30 associated with increased stroke risk, recommend weight loss, diet and exercise as appropriate"     REVIEW OF SYSTEMS: Full 14 system review of systems performed and negative with exception of: as per HPI.  ALLERGIES: Allergies  Allergen Reactions   Calcium  Carbonate Other (See Comments)    constipation   Penicillins Rash    Has patient had a PCN reaction causing immediate rash, facial/tongue/throat swelling, SOB or lightheadedness with hypotension: no Has patient had a PCN reaction causing severe rash involving mucus membranes or skin necrosis: no Has patient had a PCN reaction that required hospitalization: no Has patient had a PCN reaction occurring within the last 10 years: no If all of the above answers are "NO", then may proceed with Cephalosporin use.     HOME MEDICATIONS: Outpatient Medications  Prior to Visit  Medication Sig Dispense Refill   atorvastatin  (LIPITOR) 20 MG tablet TAKE 1 TABLET EVERY DAY 90 tablet 0   chlorthalidone  (HYGROTON ) 25 MG tablet TAKE 1/2 TABLET EVERY DAY 45 tablet 0    clopidogrel  (PLAVIX ) 75 MG tablet Take 1 tablet (75 mg total) by mouth daily. 90 tablet 3   No facility-administered medications prior to visit.    PAST MEDICAL HISTORY: Past Medical History:  Diagnosis Date   Anemia    Arthritis    Breast lump    left   Eczema    Family hx of colon cancer    Gallbladder problem    Hypertension    Kidney cysts    small, right 10/2002   Normal cardiac stress test 11/2008   exercise stress test normal but limited by exercise intolerance   Obesity    Shortness of breath    Sleep apnea    CPAP   TIA (transient ischemic attack)     PAST SURGICAL HISTORY: Past Surgical History:  Procedure Laterality Date   BUNIONECTOMY Right 11/19/1998   CHOLECYSTECTOMY  05/19/2002   US - gallstones, small renal cyst 05/2002   COLONOSCOPY WITH PROPOFOL  N/A 07/15/2019   Procedure: COLONOSCOPY WITH PROPOFOL ;  Surgeon: Ace Holder, MD;  Location: [facility];  Service: Gastroenterology;  Laterality: N/A;   PARTIAL HYSTERECTOMY  03/20/1996   fibroids, ovaries remain   POLYPECTOMY  07/15/2019   Procedure: POLYPECTOMY;  Surgeon: Ace Holder, MD;  Location: [facility];  Service: Gastroenterology;;    FAMILY HISTORY: Family History  Problem Relation Age of Onset   Colon cancer Mother        59's   Stomach cancer Mother    Lung cancer Father        smoker   Hypertension Sister    Dementia Sister    Hypertension Sister    Other Sister        died at brith   Other Brother        died at brith   Stroke Maternal Grandmother    Heart attack Maternal Grandfather    Stroke Paternal Grandmother    Colon polyps Daughter    Irritable bowel syndrome Daughter    Hypertension Son    Sleep apnea Son    Esophageal cancer Neg Hx    Rectal cancer Neg Hx     SOCIAL HISTORY: Social History   Socioeconomic History   Marital status: Single    Spouse name: Not on file   Number of children: 2   Years of education: Not on file   Highest education  level: Associate degree: academic program  Occupational History   Occupation: Retired    Associate Professor: [employer]  Tobacco Use   Smoking status: Former    Average packs/day: 0.3 packs/day for 5.0 years (1.3 ttl pk-yrs)    Types: Cigarettes    Start date: 03/21/1971   Smokeless tobacco: Never  Vaping Use   Vaping status: Never Used  Substance and Sexual Activity   Alcohol use: No    Alcohol/week: 0.0 standard drinks of alcohol   Drug use: No   Sexual activity: Not Currently  Other Topics Concern   Not on file  Social History Narrative   Not on file   Social Drivers of Health   Financial Resource Strain: Low Risk  (05/25/2023)   Overall Financial Resource Strain (CARDIA)    Difficulty of Paying Living Expenses: Not hard at all  Food Insecurity: No Food  Insecurity (03/21/2023)   Hunger Vital Sign    Worried About Running Out of Food in the Last Year: Never true    Ran Out of Food in the Last Year: Never true  Transportation Needs: No Transportation Needs (05/25/2023)   PRAPARE - Administrator, Civil Service (Medical): No    Lack of Transportation (Non-Medical): No  Physical Activity: Insufficiently Active (05/25/2023)   Exercise Vital Sign    Days of Exercise per Week: 1 day    Minutes of Exercise per Session: 30 min  Stress: No Stress Concern Present (05/25/2023)   [employer] of Occupational Health - Occupational Stress Questionnaire    Feeling of Stress : Not at all  Social Connections: Moderately Integrated (05/25/2023)   Social Connection and Isolation Panel [NHANES]    Frequency of Communication with Friends and Family: More than three times a week    Frequency of Social Gatherings with Friends and Family: More than three times a week    Attends Religious Services: More than 4 times per year    Active Member of [employer] or Organizations: Yes    Attends Banker Meetings: More than 4 times per year    Marital Status: Divorced  Intimate Partner  Violence: Not At Risk (05/25/2023)   Humiliation, Afraid, Rape, and Kick questionnaire    Fear of Current or Ex-Partner: No    Emotionally Abused: No    Physically Abused: No    Sexually Abused: No     PHYSICAL EXAM  GENERAL EXAM/CONSTITUTIONAL: Vitals:  There were no vitals filed for this visit.  There is no height or weight on file to calculate BMI. Wt Readings from Last 3 Encounters:  05/28/23 276 lb 2 oz (125.2 kg)  05/25/23 273 lb (123.8 kg)  05/22/23 273 lb 4 oz (123.9 kg)   Patient is in no distress; well developed, nourished and groomed; neck is supple  CARDIOVASCULAR: Examination of carotid arteries is normal; no carotid bruits Regular rate and rhythm, no murmurs Examination of peripheral vascular system by observation and palpation is normal  EYES: Ophthalmoscopic exam of optic discs and posterior segments is normal; no papilledema or hemorrhages No results found.  MUSCULOSKELETAL: Gait, strength, tone, movements noted in Neurologic exam below  NEUROLOGIC: MENTAL STATUS:     03/11/2019    9:50 AM  MMSE - Mini Mental State Exam  Orientation to time 5  Orientation to Place 5  Registration 3  Attention/ Calculation 5  Recall 3  Language- repeat 1   awake, alert, oriented to person, place and time recent and remote memory intact normal attention and concentration language fluent, comprehension intact, naming intact fund of knowledge appropriate  CRANIAL NERVE:  2nd - no papilledema on fundoscopic exam 2nd, 3rd, 4th, 6th - pupils equal and reactive to light, visual fields full to confrontation, extraocular muscles intact, no nystagmus 5th - facial sensation symmetric 7th - facial strength symmetric 8th - hearing intact 9th - palate elevates symmetrically, uvula midline 11th - shoulder shrug symmetric 12th - tongue protrusion midline  MOTOR:  normal bulk and tone, full strength in the BUE, BLE  SENSORY:  normal and symmetric to light touch,  temperature, vibration  COORDINATION:  finger-nose-finger, fine finger movements normal  REFLEXES:  deep tendon reflexes TRACE and symmetric  GAIT/STATION:  narrow based gait     DIAGNOSTIC DATA (LABS, IMAGING, TESTING) - I reviewed patient records, labs, notes, testing and imaging myself where available.  Lab Results  Component Value  Date   WBC 7.3 05/14/2023   HGB 13.1 05/14/2023   HCT 40.5 05/14/2023   MCV 88.4 05/14/2023   PLT 231.0 05/14/2023      Component Value Date/Time   NA 140 05/14/2023 0846   K 4.0 05/14/2023 0846   CL 102 05/14/2023 0846   CO2 31 05/14/2023 0846   GLUCOSE 103 (H) 05/14/2023 0846   BUN 17 05/14/2023 0846   CREATININE 0.92 05/14/2023 0846   CALCIUM  10.0 05/14/2023 0846   PROT 7.5 05/14/2023 0846   ALBUMIN 4.3 05/14/2023 0846   AST 17 05/14/2023 0846   ALT 14 05/14/2023 0846   ALKPHOS 72 05/14/2023 0846   BILITOT 0.6 05/14/2023 0846   GFRNONAA >60 04/25/2022 0702   GFRAA 81 (L) 05/10/2012 0324   Lab Results  Component Value Date   CHOL 126 05/14/2023   HDL 63.40 05/14/2023   LDLCALC 54 05/14/2023   TRIG 47.0 05/14/2023   CHOLHDL 2 05/14/2023   Lab Results  Component Value Date   HGBA1C 4.7 (L) 05/14/2023   No results found for: "VITAMINB12" Lab Results  Component Value Date   TSH 2.18 05/14/2023    04/25/22 MRI brain 1. Positive for Acute Right MCA territory ischemia. Several small cortical and/or subcortical infarcts involving the lateral posterior right temporal lobe cortex, superior perirolandic area. No associated hemorrhage or mass effect. 2. No other acute intracranial abnormality. Underlying moderate for age cerebral white matter signal changes, most commonly due to chronic small vessel disease. 3. Benign appearing roughly 16 mm cyst of the right face nasolabial junction (series 11, image 2).  04/24/22 CTA head / neck 1. Negative CTA for acute large vessel occlusion. 2. Severe near occlusive stenosis at the right  ICA terminus/proximal right M1 segment. 3. Atheromatous change within the carotid siphons with associated moderate to severe stenoses about the para clinoid segments, right worse than left. 4. Short-segment moderate proximal basilar stenosis, with additional focal severe left P2 stenosis. 5. Diffuse tortuosity of the major arterial vasculature of the neck, suggesting chronic underlying hypertension.  04/26/22 TTE 1. Left ventricular ejection fraction, by estimation, is 60 to 65%. The  left ventricle has normal function. The left ventricle has no regional  wall motion abnormalities. There is mild left ventricular hypertrophy.  Left ventricular diastolic parameters  are consistent with Grade I diastolic dysfunction (impaired relaxation).   2. Right ventricular systolic function is normal. The right ventricular  size is normal. There is normal pulmonary artery systolic pressure. The  estimated right ventricular systolic pressure is 28.6 mmHg.   3. The mitral valve is normal in structure. Trivial mitral valve  regurgitation.   4. The aortic valve is tricuspid. Aortic valve regurgitation is not  visualized. No aortic stenosis is present.   5. The inferior vena cava is normal in size with greater than 50%  respiratory variability, suggesting right atrial pressure of 3 mmHg.   06/26/23 CTA head / neck 1. Progressive intracranial atherosclerotic disease with moderate to  severe distal cavernous stenoses bilaterally. 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  2. Moderate focal stenosis at the right ICA terminus and the  proximal right M1 and A1 segment, increased from the prior study.  3. Moderate proximal right M2 stenoses have progressed.  4. Moderate stenosis of the inferior posterior left M2 segment has  progressed.  5. Moderate stenosis of the mid basilar artery is stable.  6. Progressive moderate proximal P2 segment stenoses bilaterally.  7. Progressive attenuation of distal PCA  branch  vessels bilaterally.  8. No significant stenosis of the carotid or vertebral arteries in  the neck.    ASSESSMENT AND PLAN  73 y.o. year old female here with:   Dx:  1. Cerebral atherosclerosis   2. Cerebrovascular accident (CVA) due to embolism of right middle cerebral artery (HCC)      PLAN:  RIGHT MCA STROKE (Feb 2024; also 2nd event likely TIA on 06/22/22) - continue clopidogrel  75mg  daily long term - continue statin, BP control, sleep apnea eval and tx  ACCELERATED INTRACRANIAL ATHEROSCLEROSIS (more likely atherosclerosis than vasculitis, given widespread calcifications) - more than expected, with only mild hypertension over many years; lipids excellent x 10 years; some OSA not treated; no diabetes or smoking; could be genetic predisposition - discussed possibility of workup for primary CNS angiitis / vasculitis, with cerebral angiogram, biopsy etc, but patient doesn't want to pursue aggressive intervetions; will monitor  Return for return to PCP, pending if symptoms worsen or fail to improve.   Virtual Visit via Video Note  I connected with Barbara Kramer on 07/11/23 at  2:00 PM EDT by a video enabled telemedicine application and verified that I am speaking with the correct person using two identifiers.   I discussed the limitations of evaluation and management by telemedicine and the availability of in person appointments. The patient expressed understanding and agreed to proceed.  Patient is at home and I am at the office.   I spent 15 minutes of face-to-face and non-face-to-face time with patient.  This included previsit chart review, lab review, study review, order entry, electronic health record documentation, patient education.      Omega Bible, MD 07/11/2023, 2:27 PM Certified in Neurology, Neurophysiology and Neuroimaging  [facility] [address] [place], [place] [address] [phone]

## 2023-07-12 ENCOUNTER — Telehealth: Payer: Self-pay

## 2023-07-12 ENCOUNTER — Encounter: Payer: Self-pay | Admitting: Diagnostic Neuroimaging

## 2023-07-12 DIAGNOSIS — Z1231 Encounter for screening mammogram for malignant neoplasm of breast: Secondary | ICD-10-CM | POA: Diagnosis not present

## 2023-07-12 LAB — HM MAMMOGRAPHY

## 2023-07-12 NOTE — Telephone Encounter (Signed)
 Spoke w/Pt regarding labs. Pt asked why they are necessary. Explained Dr. Salli Crawley put the order in for the labs on 4/11 and sent her a [facility] message about the labs. Pt stated she didn't see it until today and based on the VV yesterday wants to know if the labs are still needed. Pt asking why the Hep B and HIV test are needed. Explained MD wants to R/O any aggravating factors. Pt stated understanding and asked us  to let her know if MD does indeed need labs. Acknowledged we will ask and let her know.

## 2023-07-13 ENCOUNTER — Encounter: Payer: Self-pay | Admitting: Family Medicine

## 2023-07-13 NOTE — Telephone Encounter (Signed)
 I do not see a referral to weight management in the patient's chart.

## 2023-07-15 NOTE — Addendum Note (Signed)
 Addended by: Deri Fleet A on: 07/15/2023 06:19 PM   Modules accepted: Orders

## 2023-07-16 ENCOUNTER — Encounter (INDEPENDENT_AMBULATORY_CARE_PROVIDER_SITE_OTHER): Payer: Self-pay

## 2023-07-16 NOTE — Addendum Note (Signed)
 Addended by: Deri Fleet A on: 07/16/2023 09:20 PM   Modules accepted: Orders

## 2023-07-20 NOTE — Telephone Encounter (Signed)
 Spoke w/Pt to make her aware Dr. Salli Crawley recommended the labs she had questioned. Pt asked if she can come here and let them know she is here for labs. Affirmed w/Pt that is correct. Pt stated understanding.

## 2023-07-23 ENCOUNTER — Other Ambulatory Visit: Payer: Self-pay

## 2023-07-23 ENCOUNTER — Other Ambulatory Visit (INDEPENDENT_AMBULATORY_CARE_PROVIDER_SITE_OTHER): Payer: Self-pay

## 2023-07-23 DIAGNOSIS — I776 Arteritis, unspecified: Secondary | ICD-10-CM

## 2023-07-23 DIAGNOSIS — Z0289 Encounter for other administrative examinations: Secondary | ICD-10-CM

## 2023-07-24 ENCOUNTER — Telehealth: Payer: Self-pay

## 2023-07-24 NOTE — Telephone Encounter (Signed)
 Copied from CRM [id number]. Topic: Referral - Status >> Jul 24, 2023  9:21 AM Bambi Bonine D wrote: Reason for CRM: Tamika with [facility] stated that the patient's referral request was declined due to request type. Tamika stated that they don't see patient's for only obesity and the patient would have endocrine issues as well in order for them to see the patient.

## 2023-07-24 NOTE — Telephone Encounter (Signed)
 That is what I thought but patient requested it as so (she thought they had obesity specialist there) Please let her know  Would she like a referral to [facility]

## 2023-07-24 NOTE — Telephone Encounter (Signed)
 Called and spoke to pt about referral to [facility] instead. Pt refused. No further questions or concerns.

## 2023-08-04 LAB — HYPERCOAGULABLE PANEL, COMPREHENSIVE
APTT: 28.3 s
AT III Act/Nor PPP Chro: 101 %
Act. Prt C Resist w/FV Defic.: 2.7 ratio
Anticardiolipin Ab, IgG: <10 [GPL'U]
Anticardiolipin Ab, IgM: <10 [MPL'U]
Beta-2 Glycoprotein I, IgA: 10 SAU
Beta-2 Glycoprotein I, IgG: <10 SGU
Beta-2 Glycoprotein I, IgM: <10 SMU
DRVVT Screen Seconds: 34.4 s
Factor VII Antigen**: 75 %
Factor VIII Activity: 110 %
Hexagonal Phospholipid Neutral: 6 s
Prot C Ag Act/Nor PPP Imm: 89 %
Prot S Ag Act/Nor PPP Imm: 84 %
Protein C Ag/FVII Ag Ratio**: 1.2 ratio
Protein S Ag/FVII Ag Ratio**: 1.1 ratio

## 2023-08-04 LAB — ANCA PROFILE
Anti-MPO Antibodies: <0.2 U (ref 0.0–0.9)
Anti-PR3 Antibodies: <0.2 U (ref 0.0–0.9)
Atypical pANCA: <1:20 {titer}
C-ANCA: <1:20 {titer}
P-ANCA: <1:20 {titer}

## 2023-08-04 LAB — ANA,IFA RA DIAG PNL W/RFLX TIT/PATN
ANA Titer 1: NEGATIVE
Cyclic Citrullin Peptide Ab: 9 U (ref 0–19)
Rheumatoid fact SerPl-aCnc: 19.3 [IU]/mL — ABNORMAL HIGH (ref <14.0)

## 2023-08-04 LAB — HEPATITIS C ANTIBODY: Hep C Virus Ab: NONREACTIVE

## 2023-08-04 LAB — CRYOGLOBULIN

## 2023-08-04 LAB — HEPATITIS B CORE ANTIBODY, TOTAL: Hep B Core Total Ab: NEGATIVE

## 2023-08-04 LAB — RPR: RPR Ser Ql: NONREACTIVE

## 2023-08-04 LAB — HEPATITIS B SURFACE ANTIGEN: Hepatitis B Surface Ag: NEGATIVE

## 2023-08-04 LAB — HIV ANTIBODY (ROUTINE TESTING W REFLEX): HIV Screen 4th Generation wRfx: NONREACTIVE

## 2023-08-04 LAB — HEPATITIS B SURFACE ANTIBODY,QUALITATIVE: Hep B Surface Ab, Qual: NONREACTIVE

## 2023-08-14 ENCOUNTER — Other Ambulatory Visit: Payer: Self-pay | Admitting: Family Medicine

## 2023-08-16 ENCOUNTER — Ambulatory Visit: Payer: Self-pay | Admitting: Diagnostic Neuroimaging

## 2023-08-29 ENCOUNTER — Encounter: Payer: Self-pay | Admitting: Pharmacist

## 2023-08-29 NOTE — Progress Notes (Signed)
 Pharmacy Quality Measure Review  This patient is appearing on a report for being at risk of failing the adherence measure for cholesterol (statin) medications this calendar year.   Medication: Atorvastatin  20 mg Last fill date: 04/06/23 for 90 day supply  Insurance report was not up to date. No action needed at this time.   Medication refill sent to [facility] pharmacy 5/28.  Medication has been refilled as of 08/15/23 for a 90 day supply.  2 additional refills remaining.

## 2023-10-02 ENCOUNTER — Encounter: Payer: Self-pay | Admitting: Family Medicine

## 2023-10-02 ENCOUNTER — Ambulatory Visit (INDEPENDENT_AMBULATORY_CARE_PROVIDER_SITE_OTHER): Admitting: Family Medicine

## 2023-10-02 VITALS — BP 124/66 | HR 75 | Temp 98.1°F | Ht 62.0 in | Wt 274.0 lb

## 2023-10-02 DIAGNOSIS — N644 Mastodynia: Secondary | ICD-10-CM | POA: Insufficient documentation

## 2023-10-02 NOTE — Assessment & Plan Note (Signed)
 Bilateral breast tenderness Also sensitivity of right nipple  Recent  In setting of more daily caffeine and chocolate intake (which is not the norm for her)   Reassuring exam today /no abn  Also reassuring screening mammo in April / reviewed   Caffeine may be cause  Instructed to stop it all together  Also eat less candy/sweets/ processed food and increase water intake Make sure bra is not too tight or loose  Encouraged self breast exams  Watch for skin changes or nipple d/c as well   Instructed to stop all caffeine Call in 2 wk if not resolved (or earlier if worse) Would order imaging mammo/us   Call back and Er precautions noted in detail today

## 2023-10-02 NOTE — Patient Instructions (Signed)
 Make sure your bras are not too tight or loose   Stop caffeine  If breast discomfort is not gone in 2 weeks without caffeine then I will order some imaging  So let us  know   Make sure to drink lots of water  Eat less sugar and processed foods

## 2023-10-02 NOTE — Progress Notes (Signed)
 Subjective:    Patient ID: Barbara Kramer Dec, female    DOB: 09/12/1950, 73 y.o.   MRN: [id number]  HPI  Wt Readings from Last 3 Encounters:  10/02/23 274 lb (124.3 kg)  05/28/23 276 lb 2 oz (125.2 kg)  05/25/23 273 lb (123.8 kg)   50.12 kg/m  Vitals:   10/02/23 1226  BP: 124/66  Pulse: 75  Temp: 98.1 F (36.7 C)  SpO2: 99%    Pt presents with c/o  Sore nipple on r breast with bilateral breast tenderness   Noted this last night  Nothing looks or feel different   Activity  Nothing new   No change in bra or clothing  She did adjust bra straps when she lost weight and tightened band temporarily   Caffeine intake  Has been drinking more coffee lately (in midst of summer camp)  Drinks a cup a day on days of the camp (used to not having any)  Also ate more chocolate lately when out of town    Had normal screening mamm[facility] at [facility] in [facility]/2023  Lab Results  Component Value Date   NA 140 05/14/2023   K 4.0 05/14/2023   CO2 31 05/14/2023   GLUCOSE 103 (H) 05/14/2023   BUN 17 05/14/2023   CREATININE 0.92 05/14/2023   CALCIUM  10.0 05/14/2023   GFR 63.14 05/14/2023   GFRNONAA >60 04/25/2022   Lab Results  Component Value Date   ALT 14 05/14/2023   AST 17 05/14/2023   ALKPHOS 72 05/14/2023   BILITOT 0.6 05/14/2023   Lab Results  Component Value Date   WBC 7.3 05/14/2023   HGB 13.1 05/14/2023   HCT 40.5 05/14/2023   MCV 88.4 05/14/2023   PLT 231.0 05/14/2023   Lab Results  Component Value Date   TSH 2.18 05/14/2023       Patient Active Problem List   Diagnosis Date Noted   Breast pain 10/02/2023   Hematoma 05/28/2023   Vitamin D  deficiency 05/22/2023   Right ankle pain 03/22/2023   Strain of thoracic back region 11/23/2022   Constipation 07/13/2022   Skin hypopigmentation 07/05/2022   Lymph nodes enlarged 07/05/2022   Skin lesions 05/17/2022   Hyperlipidemia 05/03/2022   Intracranial carotid stenosis 04/25/2022   History of TIA (transient  ischemic attack) 04/24/2022   Sciatica of left side 01/04/2022   Mobility impaired 08/01/2021   Head pain 06/28/2021   Vulvar cyst 01/25/2021   Rash of neck 08/02/2020   Essential hypertension 06/30/2020   Medicare annual wellness visit, subsequent 05/14/2020   Unilateral primary osteoarthritis, left knee 05/10/2020   Morbid obesity (HCC) 05/10/2020   Family history of colon cancer in mother    Benign neoplasm of colon    OSA (obstructive sleep apnea) 01/29/2018   Estrogen deficiency 01/29/2018   Allergic rhinitis 05/15/2017   Screening mammogram, encounter for 10/11/2016   Diabetes mellitus screening 12/15/2014   Osteoarthritis of left knee 10/16/2013   Colon cancer screening 11/20/2012   Encounter for routine gynecological examination 11/17/2011   Other screening mammogram 11/17/2011   Right shoulder pain 06/05/2011   Low back pain 10/18/2010   Routine general medical examination at a health care facility 10/06/2010   ACANTHOSIS NIGRICANS 01/27/2008   Eczema 08/02/2006   Past Medical History:  Diagnosis Date   Allergy Feb. 10   due to season change   Anemia    Arthritis    Breast lump    left   Cataract  Last time I visited the doctor, doesn't need attention right now.   Eczema    Family hx of colon cancer    Gallbladder problem    Hypertension    Hopefully under control   Kidney cysts    small, right 10/2002   Normal cardiac stress test 11/2008   exercise stress test normal but limited by exercise intolerance   Obesity    Shortness of breath    Sleep apnea    CPAP   TIA (transient ischemic attack)    Past Surgical History:  Procedure Laterality Date   ABDOMINAL HYSTERECTOMY  1998   Partial   BUNIONECTOMY Right 11/19/1998   CHOLECYSTECTOMY  05/2002   US - gallstones, small renal cyst 05/2002   COLONOSCOPY WITH PROPOFOL  N/A 07/15/2019   Procedure: COLONOSCOPY WITH PROPOFOL ;  Surgeon: Leigh Elspeth SQUIBB, MD;  Location: [facility];  Service:  Gastroenterology;  Laterality: N/A;   PARTIAL HYSTERECTOMY  03/20/1996   fibroids, ovaries remain   POLYPECTOMY  07/15/2019   Procedure: POLYPECTOMY;  Surgeon: Leigh Elspeth SQUIBB, MD;  Location: [facility];  Service: Gastroenterology;;   Social History   Tobacco Use   Smoking status: Former    Average packs/day: 0.3 packs/day for 5.0 years (1.3 ttl pk-yrs)    Types: Cigarettes    Start date: 03/21/1971   Smokeless tobacco: Never  Vaping Use   Vaping status: Never Used  Substance Use Topics   Alcohol use: No    Alcohol/week: 0.0 standard drinks of alcohol   Drug use: No   Family History  Problem Relation Age of Onset   Colon cancer Mother        73's   Stomach cancer Mother    Cancer Mother    Lung cancer Father        smoker   Cancer Father    Hypertension Sister    Dementia Sister    Hypertension Sister    Other Sister        died at [employer]   Other Brother        died at brith   Stroke Maternal Grandmother    Heart attack Maternal Grandfather    Stroke Paternal Grandmother    Colon polyps Daughter    Irritable bowel syndrome Daughter    Hypertension Son    Sleep apnea Son    Esophageal cancer Neg Hx    Rectal cancer Neg Hx    Allergies  Allergen Reactions   Calcium  Carbonate Other (See Comments)    constipation   Penicillins Rash    Has patient had a PCN reaction causing immediate rash, facial/tongue/throat swelling, SOB or lightheadedness with hypotension: no Has patient had a PCN reaction causing severe rash involving mucus membranes or skin necrosis: no Has patient had a PCN reaction that required hospitalization: no Has patient had a PCN reaction occurring within the last 10 years: no If all of the above answers are NO, then may proceed with Cephalosporin use.    Current Outpatient Medications on File Prior to Visit  Medication Sig Dispense Refill   atorvastatin  (LIPITOR) 20 MG tablet TAKE 1 TABLET EVERY DAY 90 tablet 2   chlorthalidone  (HYGROTON )  25 MG tablet TAKE 1/2 TABLET EVERY DAY 45 tablet 2   clopidogrel  (PLAVIX ) 75 MG tablet Take 1 tablet (75 mg total) by mouth daily. 90 tablet 3   No current facility-administered medications on file prior to visit.    Review of Systems  Constitutional:  Negative for activity change, appetite  change, fatigue, fever and unexpected weight change.  HENT:  Negative for congestion, ear pain, rhinorrhea, sinus pressure and sore throat.   Eyes:  Negative for pain, redness and visual disturbance.  Respiratory:  Negative for cough, shortness of breath and wheezing.   Cardiovascular:  Negative for chest pain and palpitations.  Gastrointestinal:  Negative for abdominal pain, blood in stool, constipation and diarrhea.  Endocrine: Negative for polydipsia and polyuria.  Genitourinary:  Negative for dysuria, frequency and urgency.       Breast tenderness  Musculoskeletal:  Negative for arthralgias, back pain and myalgias.  Skin:  Negative for pallor and rash.  Allergic/Immunologic: Negative for environmental allergies.  Neurological:  Negative for dizziness, syncope and headaches.  Hematological:  Negative for adenopathy. Does not bruise/bleed easily.  Psychiatric/Behavioral:  Negative for decreased concentration and dysphoric mood. The patient is not nervous/anxious.        Objective:   Physical Exam Constitutional:      General: She is not in acute distress.    Appearance: Normal appearance. She is obese. She is not ill-appearing or diaphoretic.  HENT:     Head: Normocephalic and atraumatic.  Cardiovascular:     Rate and Rhythm: Normal rate and regular rhythm.     Heart sounds: Normal heart sounds.  Pulmonary:     Effort: Pulmonary effort is normal. No respiratory distress.     Breath sounds: Normal breath sounds. No wheezing.  Chest:     Chest wall: No tenderness.  Genitourinary:    Comments: Breast exam: No mass, nodules, thickening, tenderness, bulging, retraction, inflamation, nipple  discharge or skin changes noted.  No axillary or clavicular LA.     (Not tender to light palpation today)    Musculoskeletal:     Cervical back: Neck supple.     Right lower leg: No edema.     Left lower leg: No edema.  Lymphadenopathy:     Cervical: No cervical adenopathy.  Neurological:     Mental Status: She is alert.     Cranial Nerves: No cranial nerve deficit.     Sensory: No sensory deficit.     Coordination: Coordination normal.  Psychiatric:        Mood and Affect: Mood normal.           Assessment & Plan:   Problem List Items Addressed This Visit       Other   Breast pain - Primary   Bilateral breast tenderness Also sensitivity of right nipple  Recent  In setting of more daily caffeine and chocolate intake (which is not the norm for her)   Reassuring exam today /no abn  Also reassuring screening mammo in April / reviewed   Caffeine may be cause  Instructed to stop it all together  Also eat less candy/sweets/ processed food and increase water intake Make sure bra is not too tight or loose  Encouraged self breast exams  Watch for skin changes or nipple d/c as well   Instructed to stop all caffeine Call in 2 wk if not resolved (or earlier if worse) Would order imaging mammo/us   Call back and Er precautions noted in detail today

## 2024-01-16 DIAGNOSIS — Z8249 Family history of ischemic heart disease and other diseases of the circulatory system: Secondary | ICD-10-CM | POA: Diagnosis not present

## 2024-01-16 DIAGNOSIS — Z87891 Personal history of nicotine dependence: Secondary | ICD-10-CM | POA: Diagnosis not present

## 2024-01-16 DIAGNOSIS — Z7902 Long term (current) use of antithrombotics/antiplatelets: Secondary | ICD-10-CM | POA: Diagnosis not present

## 2024-01-16 DIAGNOSIS — G4733 Obstructive sleep apnea (adult) (pediatric): Secondary | ICD-10-CM | POA: Diagnosis not present

## 2024-01-16 DIAGNOSIS — Z809 Family history of malignant neoplasm, unspecified: Secondary | ICD-10-CM | POA: Diagnosis not present

## 2024-01-16 DIAGNOSIS — H269 Unspecified cataract: Secondary | ICD-10-CM | POA: Diagnosis not present

## 2024-01-16 DIAGNOSIS — M1990 Unspecified osteoarthritis, unspecified site: Secondary | ICD-10-CM | POA: Diagnosis not present

## 2024-01-16 DIAGNOSIS — Z823 Family history of stroke: Secondary | ICD-10-CM | POA: Diagnosis not present

## 2024-01-16 DIAGNOSIS — Z6841 Body Mass Index (BMI) 40.0 and over, adult: Secondary | ICD-10-CM | POA: Diagnosis not present

## 2024-01-16 DIAGNOSIS — E785 Hyperlipidemia, unspecified: Secondary | ICD-10-CM | POA: Diagnosis not present

## 2024-01-16 DIAGNOSIS — I10 Essential (primary) hypertension: Secondary | ICD-10-CM | POA: Diagnosis not present

## 2024-01-17 ENCOUNTER — Telehealth: Payer: Self-pay | Admitting: *Deleted

## 2024-01-17 NOTE — Telephone Encounter (Signed)
 Please schedule fasting labs prior to CPE, PCP will place orders closer to appt date

## 2024-01-17 NOTE — Telephone Encounter (Signed)
 Copied from CRM [id number]. Topic: Clinical - Request for Lab/Test Order >> Jan 17, 2024  9:20 AM Macario HERO wrote: Reason for CRM: Patient called requesting labs prior to her physical.

## 2024-03-22 ENCOUNTER — Other Ambulatory Visit: Payer: Self-pay | Admitting: Family Medicine

## 2024-05-13 ENCOUNTER — Other Ambulatory Visit

## 2024-05-23 ENCOUNTER — Encounter: Admitting: Family Medicine

## 2024-05-27 ENCOUNTER — Ambulatory Visit
